# Patient Record
Sex: Male | Born: 1950 | Race: White | Hispanic: No | Marital: Married | Smoking: Never smoker
Health system: Southern US, Community
[De-identification: ages and names within clinical notes are randomized; demographics above are authoritative.]

## PROBLEM LIST (undated history)

## (undated) DIAGNOSIS — I10 Essential (primary) hypertension: Secondary | ICD-10-CM

## (undated) DIAGNOSIS — F329 Major depressive disorder, single episode, unspecified: Secondary | ICD-10-CM

## (undated) DIAGNOSIS — M62838 Other muscle spasm: Secondary | ICD-10-CM

## (undated) DIAGNOSIS — E785 Hyperlipidemia, unspecified: Secondary | ICD-10-CM

## (undated) DIAGNOSIS — K219 Gastro-esophageal reflux disease without esophagitis: Secondary | ICD-10-CM

## (undated) DIAGNOSIS — T8859XA Other complications of anesthesia, initial encounter: Secondary | ICD-10-CM

## (undated) DIAGNOSIS — D649 Anemia, unspecified: Secondary | ICD-10-CM

## (undated) DIAGNOSIS — M1990 Unspecified osteoarthritis, unspecified site: Secondary | ICD-10-CM

## (undated) DIAGNOSIS — I714 Abdominal aortic aneurysm, without rupture, unspecified: Secondary | ICD-10-CM

## (undated) DIAGNOSIS — F419 Anxiety disorder, unspecified: Secondary | ICD-10-CM

## (undated) DIAGNOSIS — G4733 Obstructive sleep apnea (adult) (pediatric): Secondary | ICD-10-CM

## (undated) DIAGNOSIS — R531 Weakness: Secondary | ICD-10-CM

## (undated) DIAGNOSIS — R350 Frequency of micturition: Secondary | ICD-10-CM

## (undated) DIAGNOSIS — M4300 Spondylolysis, site unspecified: Secondary | ICD-10-CM

## (undated) DIAGNOSIS — F32A Depression, unspecified: Secondary | ICD-10-CM

## (undated) DIAGNOSIS — M792 Neuralgia and neuritis, unspecified: Secondary | ICD-10-CM

## (undated) HISTORY — DX: Essential (primary) hypertension: I10

## (undated) HISTORY — DX: Hyperlipidemia, unspecified: E78.5

## (undated) HISTORY — PX: ANKLE ARTHROPLASTY: SUR68

## (undated) HISTORY — DX: Obstructive sleep apnea (adult) (pediatric): G47.33

## (undated) HISTORY — PX: APPENDECTOMY: SHX54

## (undated) HISTORY — DX: Major depressive disorder, single episode, unspecified: F32.9

## (undated) HISTORY — PX: OTHER SURGICAL HISTORY: SHX169

## (undated) HISTORY — PX: TONSILLECTOMY: SUR1361

## (undated) HISTORY — DX: Depression, unspecified: F32.A

## (undated) HISTORY — PX: COLONOSCOPY: SHX174

## (undated) HISTORY — DX: Anxiety disorder, unspecified: F41.9

---

## 2001-02-06 HISTORY — PX: REFRACTIVE SURGERY: SHX103

## 2003-10-08 HISTORY — PX: OTHER SURGICAL HISTORY: SHX169

## 2003-10-15 ENCOUNTER — Ambulatory Visit (HOSPITAL_COMMUNITY): Admission: RE | Admit: 2003-10-15 | Discharge: 2003-10-15 | Payer: Self-pay | Admitting: General Surgery

## 2006-08-06 ENCOUNTER — Encounter: Admission: RE | Admit: 2006-08-06 | Discharge: 2006-08-06 | Payer: Self-pay | Admitting: Internal Medicine

## 2007-05-29 ENCOUNTER — Ambulatory Visit: Payer: Self-pay | Admitting: Internal Medicine

## 2007-06-10 ENCOUNTER — Ambulatory Visit: Payer: Self-pay | Admitting: Internal Medicine

## 2010-06-24 NOTE — Op Note (Signed)
Joshua [name], Joshua [name]                          ACCOUNT NO.:  [id number]   MEDICAL RECORD NO.:  [id number]                   PATIENT TYPE:  AMB   LOCATION:  DAY                                  FACILITY:  [facility]   PHYSICIAN:  Adolph Pollack, M.D.            DATE OF BIRTH:  Feb 06, 1951   DATE OF PROCEDURE:  10/15/2003  DATE OF DISCHARGE:                                 OPERATIVE REPORT   PREOPERATIVE DIAGNOSIS:  Bilateral inguinal herniae.   POSTOPERATIVE DIAGNOSIS:  Bilateral indirect inguinal herniae.   PROCEDURE:  Laparoscopic repair of bilateral inguinal herniae with mesh.   SURGEON:  Adolph Pollack, M.D.   ANESTHESIA:  General.   INDICATIONS FOR PROCEDURE:  [name] is a 60 year old male I had seen in the  past with bilateral inguinal herniae, left greater than right.  He came back  to the office here recently requesting repair and we discussed the  laparoscopic technique and he presents for that now.  The procedure and  risks were discussed with him preoperatively.   SURGICAL TECHNIQUE:  He was seen in the holding area and brought to the  operating room and placed supine on the operating table.  A general  anesthetic was administered.  The hair on the lower abdominal wall was  clipped and a Foley catheter was placed in the bladder sterilely.  The lower  abdominal wall and groin were sterilely prepped and draped.  Dilute Marcaine  solution was then infiltrated in the subumbilical region and a subumbilical  incision was made through the skin and subcutaneous tissue.  Using blunt  dissection, I identified the left anterior rectus sheath and made a small  incision in it.  The underlying rectus muscle was swept laterally exposing  the posterior rectus sheath.  A balloon dissection device was then placed in  the extraperitoneal space under laparoscopic visualization.  Balloon  dissection was performed.  Once this was done, I removed the balloon  dissection trocar and  inserted a trocar into the extraperitoneal space  insufflating the CO2 gas creating a working area.  The laparoscope was then  introduced and under direct vision, two 5 mm trocars were then placed  through small incisions in the lower midline.  I approached the left side  first and identified Coopers ligament.  I dissected fibrofatty tissue away  from the anterior and lateral abdominal walls to the level of the umbilicus.  I then isolated the spermatic cord and noticed an indirect hernia sac going  up to a somewhat patulous internal ring.  I was able to dissect the sac free  from the cord and strip it back to the level of the umbilicus.  The direct  space appeared to be solid.   Next, I approached the right side and identified Coopers ligament and  dissected fibrofatty tissue from it.  The direct space was identified and  was solid.  Using blunt  dissection, I dissected fibrofatty tissue away from  the anterior and lateral abdominal wall up to the level of the umbilicus.  The spermatic cord was identified, isolated, and another indirect sac was  noted on the right side and this was dissected free from the cord and  stripped back to the level of the umbilicus.  Following this, a piece of 5  by 6 inch mesh with a partial longitudinal slit cut into it was then placed  into the left extraperitoneal space and positioned adequately with the two  tails of the mesh wrapped around the spermatic cord.  The inferomedial  portion of the mesh was anchored to Coopers ligament with spiral tacks.  The  anterior and lateral aspects of the mesh were then anchored to the abdominal  wall with spiral tacks.  This appeared to provide more than adequate  coverage of the direct, indirect, and femoral spaces.  Following this,  another piece of 5 by 6 inch mesh with a partial longitudinal slit cut into  it was then placed into the right extraperitoneal space.  The two tails of  the mesh were wrapped around the  spermatic cord.  The mesh was then  positioned appropriately and was anchored to Coopers ligament, the anterior  and lateral abdominal walls with the spiral tacking device.  This provided  for more than adequate coverage of direct, indirect, and femoral spaces.   I then inspected the area and hemostasis was adequate.  I then used  instruments to hold down the inferolateral aspects of the mesh and released  the CO2 gas.  I removed the instruments and the trocars.  The left anterior  rectus sheath defect was then closed with interrupted 0 Vicryl sutures.  The  skin incisions were closed with 4-0 Monocryl subcuticular stitches followed  by Steri-Strips and sterile dressings.  He tolerated the procedure well  without any apparent complication and was taken to the recovery room in  satisfactory condition.  I discussed this with his wife.  He will be given  postop instructions and Tylox for pain.  He will be seen back in the office  in 2-3 weeks.                                               Adolph Pollack, M.D.    Kari Baars  D:  10/15/2003  T:  10/15/2003  Job:  [id number]   cc:   Vania Rea. Jarold Motto, M.D. [facility]

## 2011-10-10 ENCOUNTER — Ambulatory Visit
Admission: RE | Admit: 2011-10-10 | Discharge: 2011-10-10 | Disposition: A | Discharge status: Home / Self Care | Payer: 59 | Source: Ambulatory Visit | Attending: Internal Medicine | Admitting: Internal Medicine

## 2011-10-10 ENCOUNTER — Other Ambulatory Visit: Payer: Self-pay | Admitting: Internal Medicine

## 2011-10-10 DIAGNOSIS — M545 Low back pain, unspecified: Secondary | ICD-10-CM

## 2012-04-04 ENCOUNTER — Other Ambulatory Visit (HOSPITAL_COMMUNITY): Payer: Self-pay | Admitting: Internal Medicine

## 2012-04-04 DIAGNOSIS — M545 Low back pain, unspecified: Secondary | ICD-10-CM

## 2012-04-04 DIAGNOSIS — R200 Anesthesia of skin: Secondary | ICD-10-CM

## 2012-04-05 ENCOUNTER — Ambulatory Visit (HOSPITAL_COMMUNITY)
Admission: RE | Admit: 2012-04-05 | Discharge: 2012-04-05 | Disposition: A | Discharge status: Home / Self Care | Payer: 59 | Source: Ambulatory Visit | Attending: Internal Medicine | Admitting: Internal Medicine

## 2012-04-05 DIAGNOSIS — M5124 Other intervertebral disc displacement, thoracic region: Secondary | ICD-10-CM | POA: Insufficient documentation

## 2012-04-05 DIAGNOSIS — IMO0002 Reserved for concepts with insufficient information to code with codable children: Secondary | ICD-10-CM | POA: Insufficient documentation

## 2012-04-05 DIAGNOSIS — M5144 Schmorl's nodes, thoracic region: Secondary | ICD-10-CM | POA: Insufficient documentation

## 2012-04-05 DIAGNOSIS — R209 Unspecified disturbances of skin sensation: Secondary | ICD-10-CM | POA: Insufficient documentation

## 2012-04-05 DIAGNOSIS — I77819 Aortic ectasia, unspecified site: Secondary | ICD-10-CM | POA: Insufficient documentation

## 2012-04-05 DIAGNOSIS — M545 Low back pain, unspecified: Secondary | ICD-10-CM | POA: Insufficient documentation

## 2012-04-06 ENCOUNTER — Ambulatory Visit (HOSPITAL_COMMUNITY)
Admission: RE | Admit: 2012-04-06 | Discharge: 2012-04-06 | Disposition: A | Discharge status: Home / Self Care | Payer: 59 | Source: Ambulatory Visit | Attending: Internal Medicine | Admitting: Internal Medicine

## 2012-04-06 DIAGNOSIS — R200 Anesthesia of skin: Secondary | ICD-10-CM

## 2012-04-06 DIAGNOSIS — M545 Low back pain: Secondary | ICD-10-CM

## 2013-04-18 ENCOUNTER — Other Ambulatory Visit: Payer: Self-pay | Admitting: Internal Medicine

## 2013-04-21 ENCOUNTER — Other Ambulatory Visit: Payer: Self-pay | Admitting: Internal Medicine

## 2013-04-21 DIAGNOSIS — I714 Abdominal aortic aneurysm, without rupture, unspecified: Secondary | ICD-10-CM

## 2013-04-25 ENCOUNTER — Ambulatory Visit
Admission: RE | Admit: 2013-04-25 | Discharge: 2013-04-25 | Disposition: A | Discharge status: Home / Self Care | Payer: 59 | Source: Ambulatory Visit | Attending: Internal Medicine | Admitting: Internal Medicine

## 2013-04-25 DIAGNOSIS — I714 Abdominal aortic aneurysm, without rupture, unspecified: Secondary | ICD-10-CM

## 2013-09-30 ENCOUNTER — Encounter: Payer: Self-pay | Admitting: Neurology

## 2013-10-01 ENCOUNTER — Encounter: Payer: 59 | Admitting: Neurology

## 2013-10-01 NOTE — Progress Notes (Signed)
NO SHOW

## 2013-10-02 NOTE — Progress Notes (Signed)
This patient was one of th few not happy with [facility], and is looking for an alternative DME. CD

## 2013-10-03 ENCOUNTER — Ambulatory Visit (INDEPENDENT_AMBULATORY_CARE_PROVIDER_SITE_OTHER): Payer: 59 | Admitting: Neurology

## 2013-10-03 ENCOUNTER — Encounter: Payer: Self-pay | Admitting: Neurology

## 2013-10-03 VITALS — BP 134/85 | HR 75 | Resp 16 | Ht 65.25 in | Wt 154.0 lb

## 2013-10-03 DIAGNOSIS — M6250 Muscle wasting and atrophy, not elsewhere classified, unspecified site: Secondary | ICD-10-CM

## 2013-10-03 DIAGNOSIS — M6258 Muscle wasting and atrophy, not elsewhere classified, other site: Secondary | ICD-10-CM

## 2013-10-03 DIAGNOSIS — G4710 Hypersomnia, unspecified: Secondary | ICD-10-CM

## 2013-10-03 DIAGNOSIS — G4730 Sleep apnea, unspecified: Principal | ICD-10-CM

## 2013-10-03 NOTE — Patient Instructions (Signed)
Reduce Wellbutrin to one a day and than discontinue 14 days prior to MSLT.  Ritalin: do not use the day before a or the day of the test. Hypersomnia Hypersomnia usually brings recurrent episodes of excessive daytime sleepiness or prolonged nighttime sleep. It is different than feeling tired due to lack of or interrupted sleep at night. People with hypersomnia are compelled to nap repeatedly during the day. This is often at inappropriate times such as:  At work.  During a meal.  In conversation. These daytime naps usually provide no relief. This disorder typically affects adolescents and young adults. CAUSES  This condition may be caused by:  Another sleep disorder (such as narcolepsy or sleep apnea).  Dysfunction of the autonomic nervous system.  Drug or alcohol abuse.  A physical problem, such as:  A tumor.  Head trauma. This is damage caused by an accident.  Injury to the central nervous system.  Certain medications, or medicine withdrawal.  Medical conditions may contribute to the disorder, including:  Multiple sclerosis.  Depression.  Encephalitis.  Epilepsy.  Obesity.  Some people appear to have a genetic predisposition to this disorder. In others, there is no known cause. SYMPTOMS   Patients often have difficulty waking from a long sleep. They may feel dazed or confused.  Other symptoms may include:  Anxiety.  Increased irritation (inflammation).  Decreased energy.  Restlessness.  Slow thinking.  Slow speech.  Loss of appetite.  Hallucinations.  Memory difficulty.  Tremors, Tics.  Some patients lose the ability to function in family, social, occupational, or other settings. TREATMENT  Treatment is symptomatic in nature. Stimulants and other drugs may be used to treat this disorder. Changes in behavior may help. For example, avoid night work and social activities that delay bed time. Changes in diet may offer some relief. Patients should  avoid alcohol and caffeine. PROGNOSIS  The likely outcome (prognosis) for persons with hypersomnia depends on the cause of the disorder. The disorder itself is not life threatening. But it can have serious consequences. For example, automobile accidents can be caused by falling asleep while driving. The attacks usually continue indefinitely. Document Released: 01/13/2002 Document Revised: 04/17/2011 Document Reviewed: 12/18/2007 [facility] Patient Information 2015 [place], [place]. This information is not intended to replace advice given to you by your health care provider. Make sure you discuss any questions you have with your health care provider.

## 2013-10-03 NOTE — Addendum Note (Signed)
Addended by: [name] on: 10/03/2013 11:08 AM   Modules accepted: Orders

## 2013-10-03 NOTE — Progress Notes (Signed)
SLEEP MEDICINE CLINIC   Provider:  [name], M D  Referring Provider: No ref. provider found Primary Care Physician:  [name], MD  Chief Complaint  Patient presents with  . New Evaluation    Room 10  . Sleep consult    HPI:  Joshua [name] is a 63 y.o. male , who is seen here as a referral from Dr. [name] for hypersomnia.   [name] is a loud snorer and his wife asked him to sleep in another room. His snoring may have been louder, he knows he snored for decades. His wife , a Software engineer, was recently diagnosed with a cancer  and underwent a Tongue surgery ( 2015) , back at work now. Has speech and swallowing difficulties, which also concern him.  The patient endorsed today the geriatric depression scale it took points, the Epworth sleepiness scale at 17 points in the fatigue severity score at 21 points. [name]  is a slender individual, muscular but not overweight,  had undergone a sleep study in 2003 which returned without  evidence of sleep abnormalities at that time.  The patient goes to bed around 10.30 to 11.30 Pm , the bedroom is cool , quiet and dark,  goes to sleep promptly. Has a sound machine , sleeps alone.   He is currently not gainfully employed. He wakes up from nocturia 3 times at night, which he attributes to hypertension medication.  Generally, he likes to stay in bed- wakes at 6.30  , feels un-restored  and has a lot more anxiety , racing thoughts.  He has more frequent dreams, but never acted these out.  He has an irresistible urge to fall asleep. He may stay for a half hour in bed. This began with the job loss. He was in a high paced job, Engineer, water . His children have left the parental home.  He will get 6 hours of sleep at night, has recently started to take power naps, 30 -60 minutes. He regularly exercises and feels better with physical activity.   He has a low carb nutrition plan he sticks to. He has some milder back pain, he lives  with it. ( Dr. [name] ) , some shoulder pain, hindering him to sleep on his left.            Review of Systems: Out of a complete 14 system review, the patient complains of only the following symptoms, and all other reviewed systems are negative. Snoring, nocturia . Sleep attacks.   Epworth score 17 , Fatigue severity score 21  , depression score 2    History   Social History  . Marital Status: Married    Spouse Name: Joshua [name]    Number of Children: 2  . Years of Education: College   Occupational History  .     Social History Main Topics  . Smoking status: Never Smoker   . Smokeless tobacco: Never Used  . Alcohol Use: Yes     Comment: socially  . Drug Use: No  . Sexual Activity: Not on file   Other Topics Concern  . Not on file   Social History Narrative   Patient is married Joshua [name])   Patient has two children.   Patient drinks two caffeine drinks per day.   Patient is right-handed.   Patient has a college education.    Family History  Problem Relation Age of Onset  . Lung disease Father   . Parkinson's disease Father   .  Lymphoma Mother     Past Medical History  Diagnosis Date  . Depression   . Herpes simplex     Right buttocks  . Hyperlipidemia   . Hypersomnolence   . Low back pain     Right  . Anxiety   . Hypertension     Past Surgical History  Procedure Laterality Date  . Lap inguinal hernia repair wiht mesh Bilateral 10/2003  . Refractive surgery  2003    Current Outpatient Prescriptions  Medication Sig Dispense Refill  . aspirin 325 MG EC tablet Take 325 mg by mouth daily.      Marland Kitchen buPROPion (WELLBUTRIN SR) 100 MG 12 hr tablet Take 100 mg by mouth 2 (two) times daily.      . famciclovir (FAMVIR) 125 MG tablet Take 125 mg by mouth 2 (two) times daily as needed.      . gabapentin (NEURONTIN) 300 MG capsule Take 300 mg by mouth 2 (two) times daily. To reduce pain      . losartan-hydrochlorothiazide (HYZAAR) 50-12.5 MG per tablet Take  1 tablet by mouth daily.      . methylphenidate (RITALIN SR) 20 MG ER tablet Take 20 mg by mouth daily.      . metoprolol succinate (TOPROL-XL) 25 MG 24 hr tablet Take 25 mg by mouth daily.      . Multiple Vitamin (MULTIVITAMINS PO) Take 1 tablet by mouth daily.      . simvastatin (ZOCOR) 40 MG tablet Take 40 mg by mouth daily.       No current facility-administered medications for this visit.    Allergies as of 10/03/2013  . (Not on File)    Vitals: BP 134/85  Pulse 75  Resp 16  Ht 5' 5.25" (1.657 m)  Wt 154 lb (69.854 kg)  BMI 25.44 kg/m2 Last Weight:  Wt Readings from Last 1 Encounters:  10/03/13 154 lb (69.854 kg)       Last Height:   Ht Readings from Last 1 Encounters:  10/03/13 5' 5.25" (1.657 m)    Physical exam:  General: The patient is awake, alert and appears not in acute distress. The patient is well groomed. Head: Normocephalic, atraumatic. Neck is supple. Mallampati 2  neck circumference: 16. Nasal airflow unrestricted, TMJ is not  evident . Retrognathia is not seen.  Cardiovascular:  Regular rate and rhythm , without  murmurs or carotid bruit, and without distended neck veins. Respiratory: Lungs are clear to auscultation. Skin:  Without evidence of edema, or rash Trunk: BMI is not  elevated and patient  has normal posture.  Neurologic exam : The patient is awake and alert, oriented to place and time.   Memory subjective  described as intact. There is a normal attention span & concentration ability. Speech is fluent without   dysarthria, dysphonia or aphasia. Mood and affect are appropriate.  Cranial nerves: Pupils are equal and briskly reactive to light. Funduscopic exam without  evidence of pallor or edema.  Extraocular movements  in vertical and horizontal planes intact and without nystagmus. Visual fields by finger perimetry are intact. Hearing to finger rub intact.  Facial sensation intact to fine touch. Facial motor strength is symmetric and tongue and  uvula move midline.  Motor exam:  Normal tone ,muscle bulk and symmetric ,strength in all extremities.  Sensory:  Fine touch, pinprick and vibration were tested in all extremities. Proprioception is  normal.  Coordination: Rapid alternating movements in the fingers/hands is normal. Finger-to-nose maneuver  normal  without evidence of ataxia, dysmetria or tremor.  Gait and station: Patient walks without assistive device and is able unassisted to climb up to the exam table.  Strength within normal limits. Stance is stable and normal.   Deep tendon reflexes: in the  upper and lower extremities are symmetric and intact. Babinski maneuver response is downgoing.   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 No physical indicators of high risk for sleep apnea.  2) Epworth 17, without cataplexy, but narcoleptic levels of sleepiness.   Nuvigil ordered, PSG with MSLT to follow.    The patient was advised of the nature of the diagnosed sleep disorder , the treatment options and risks for general a health and wellness arising from not treating the condition. Visit duration was 45 minutes.   Plan:  Treatment plan and additional workup :     [name] MD  10/03/2013

## 2013-10-04 LAB — COMPREHENSIVE METABOLIC PANEL
A/G RATIO: 1.7 (ref 1.1–2.5)
ALK PHOS: 78 IU/L (ref 39–117)
ALT: 25 IU/L (ref 0–44)
AST: 27 IU/L (ref 0–40)
Albumin: 4.1 g/dL (ref 3.6–4.8)
BUN/Creatinine Ratio: 20 (ref 10–22)
BUN: 23 mg/dL (ref 8–27)
CALCIUM: 9.6 mg/dL (ref 8.6–10.2)
CHLORIDE: 99 mmol/L (ref 97–108)
CO2: 26 mmol/L (ref 18–29)
Creatinine, Ser: 1.13 mg/dL (ref 0.76–1.27)
GFR calc Af Amer: 81 mL/min/{1.73_m2} (ref >59)
GFR calc non Af Amer: 70 mL/min/{1.73_m2} (ref >59)
GLOBULIN, TOTAL: 2.4 g/dL (ref 1.5–4.5)
Glucose: 74 mg/dL (ref 65–99)
Potassium: 4.5 mmol/L (ref 3.5–5.2)
Sodium: 142 mmol/L (ref 134–144)
Total Bilirubin: 0.8 mg/dL (ref 0.0–1.2)
Total Protein: 6.5 g/dL (ref 6.0–8.5)

## 2013-10-04 LAB — T3, FREE: T3 FREE: 2.8 pg/mL (ref 2.0–4.4)

## 2013-10-07 ENCOUNTER — Telehealth: Payer: Self-pay | Admitting: Neurology

## 2013-10-07 NOTE — Telephone Encounter (Signed)
Per Dr. Brett Fairy, left a voice message for the patient that his labs were normal including his thyroid.  Patient was advised to call the office with any questions or concerns.

## 2013-10-07 NOTE — Telephone Encounter (Signed)
Patient requesting Blood Work results.  Please return call anytime and may leave detailed message on voice mail.

## 2013-10-08 ENCOUNTER — Telehealth: Payer: Self-pay | Admitting: Neurology

## 2013-10-08 NOTE — Telephone Encounter (Signed)
All labs in normal range, Vit D and testosterone to be addressed by PCP, we will not get coverage, TSH was normal.  I left VM for patient , CD

## 2013-11-04 ENCOUNTER — Telehealth: Payer: Self-pay | Admitting: Neurology

## 2013-11-04 NOTE — Telephone Encounter (Signed)
Patient calling to get more instructions about what he needs to do regarding going off of his medication before his sleep study, please return call and advise.

## 2013-11-05 NOTE — Telephone Encounter (Signed)
Can you touch bases with patient concerning his medication before sleep test are advise me on what he needs to do and I can give him a call.

## 2013-11-13 ENCOUNTER — Ambulatory Visit (INDEPENDENT_AMBULATORY_CARE_PROVIDER_SITE_OTHER): Payer: 59

## 2013-11-13 DIAGNOSIS — G4730 Sleep apnea, unspecified: Principal | ICD-10-CM

## 2013-11-13 DIAGNOSIS — G4733 Obstructive sleep apnea (adult) (pediatric): Secondary | ICD-10-CM

## 2013-11-13 DIAGNOSIS — R0683 Snoring: Secondary | ICD-10-CM

## 2013-11-13 DIAGNOSIS — G4710 Hypersomnia, unspecified: Secondary | ICD-10-CM

## 2013-11-27 ENCOUNTER — Telehealth: Payer: Self-pay | Admitting: *Deleted

## 2013-11-27 NOTE — Telephone Encounter (Signed)
Patient called requesting we email him his test results.  I emailed him and stated that there was a positive diagnosis for OSA and a subsequent CPAP study had been ordered.  Patient was informed to contact our office to schedule that.  A copy of the test results were mailed to him and a copy was faxed to Dr. Leanna Battles.

## 2013-12-01 ENCOUNTER — Encounter: Payer: Self-pay | Admitting: Neurology

## 2013-12-01 ENCOUNTER — Ambulatory Visit (INDEPENDENT_AMBULATORY_CARE_PROVIDER_SITE_OTHER): Payer: 59 | Admitting: Neurology

## 2013-12-01 VITALS — BP 136/83 | HR 55 | Temp 97.6°F | Resp 12 | Ht 66.5 in | Wt 156.0 lb

## 2013-12-01 DIAGNOSIS — G4710 Hypersomnia, unspecified: Secondary | ICD-10-CM

## 2013-12-01 DIAGNOSIS — G4733 Obstructive sleep apnea (adult) (pediatric): Secondary | ICD-10-CM

## 2013-12-01 DIAGNOSIS — G4730 Sleep apnea, unspecified: Secondary | ICD-10-CM

## 2013-12-01 HISTORY — DX: Obstructive sleep apnea (adult) (pediatric): G47.33

## 2013-12-01 NOTE — Progress Notes (Signed)
SLEEP [facility]INE [facility]IC   Provider:  [name], M D  Referring Provider: Leanna Battles, MD Primary Care Physician:  [name], MD  Chief Complaint  Patient presents with  . RV sleep    Rm 10, Alone    HPI:  Joshua [name] is a 63 y.o. male , who is seen here as a referral from Dr. Philip [name] for hypersomnia.   Joshua [name] is a loud snorer and his wife asked him to sleep in another room. His snoring may have been louder, he knows he snored for decades. His wife , a Software engineer, was recently diagnosed with a cancer  and underwent a Tongue surgery ( 2015) , back at work now. Has speech and swallowing difficulties, which also concern him.  The patient endorsed today the geriatric depression scale it took points, the Epworth sleepiness scale at 17 points in the fatigue severity score at 21 points. Joshua [name]  is a slender individual, muscular but not overweight,  had undergone a sleep study in 2003 which returned without  evidence of sleep abnormalities at that time.  The patient goes to bed around 10.30 to 11.30 Pm , the bedroom is cool , quiet and dark,  goes to sleep promptly. Has a sound machine , sleeps alone.   He is currently not gainfully employed. He wakes up from nocturia 3 times at night, which he attributes to hypertension medication.  Generally, he likes to stay in bed- wakes at 6.30  , feels un-restored  and has a lot more anxiety , racing thoughts.  He has more frequent dreams, but never acted these out.  He has an irresistible urge to fall asleep. He may stay for a half hour in bed. This began with the job loss. He was in a high paced job, Engineer, water . His children have left the parental home.  He will get 6 hours of sleep at night, has recently started to take power naps, 30 -60 minutes. He regularly exercises and feels better with physical activity.   He has a low carb nutrition plan he sticks to. He has some milder back pain, he lives with it. ( Dr. [name]  ) , some shoulder pain, hindering him to sleep on his left.   Interval history 12-01-13,  [name] underwent a polysomnography study on 11-13-13 which documented a mild to moderate sleep apnea of the overall AHI was 16.6 but the RDI was 33.3. I would consider this a apnea with upper airway resistance syndrome. He did not have significant oxygen desaturations and not significant sleep interruptions from periodic limb movements. There is a strong positional component to his apnea. We discussed today that he could be a candidate for a dental device that would move his lower jaw forward and therefore opening space and the back these devices can help with bruxism and they can in some circumstances help with TMJ positive airway pressure is 1 option as well in addition to a dental device we need to avoid supine sleep and I discussed the tennis ball method with the patient.   I explained that the dental device has a success rate of about 65% and can be made to measure for him based on a medical diagnosis. This would allow him to use his usual medical insurance instead of a special dental insurance to have it manufactured. I gave him a hand out about the dental devices- as he very apprehensive about CPAP use. The tennisball was less scary to him,  he is aware that  may return for a HST to check the success of the dental device.       Review of Systems: Out of a complete 14 system review, the patient complains of only the following symptoms, and all other reviewed systems are negative. Snoring, nocturia . Sleep attacks.  Epworth 12,  FSS  23 points , avoiding sleeping on the side.   Last Epworth score was 17 , Fatigue severity score 21  , depression score 2  Points.    History   Social History  . Marital Status: Married    Spouse Name: [name]    Number of Children: 2  . Years of Education: College   Occupational History  .     Social History Main Topics  . Smoking status: Never Smoker   .  Smokeless tobacco: Never Used  . Alcohol Use: Yes     Comment: socially  . Drug Use: No  . Sexual Activity: Not on file   Other Topics Concern  . Not on file   Social History Narrative   Patient is married [name])   Patient has two children.   Patient drinks two caffeine drinks per day.   Patient is right-handed.   Patient has a college education.    Family History  Problem Relation Age of Onset  . Lung disease Father   . Parkinson's disease Father   . Lymphoma Mother     Past Medical History  Diagnosis Date  . Depression   . Herpes simplex     Right buttocks  . Hyperlipidemia   . Hypersomnolence   . Low back pain     Right  . Anxiety   . Hypertension   . Hypersomnia, persistent 10/03/2013    Past Surgical History  Procedure Laterality Date  . Lap inguinal hernia repair wiht mesh Bilateral 10/2003  . Refractive surgery  2003    Current Outpatient Prescriptions  Medication Sig Dispense Refill  . aspirin 325 MG EC tablet Take 325 mg by mouth daily.      Marland Kitchen buPROPion (WELLBUTRIN SR) 100 MG 12 hr tablet Take 100 mg by mouth 2 (two) times daily.      . famciclovir (FAMVIR) 125 MG tablet Take 125 mg by mouth 2 (two) times daily as needed.      . gabapentin (NEURONTIN) 300 MG capsule Take 300 mg by mouth 2 (two) times daily. To reduce pain      . losartan-hydrochlorothiazide (HYZAAR) 50-12.5 MG per tablet Take 1 tablet by mouth daily.      . methylphenidate (RITALIN SR) 20 MG ER tablet Take 20 mg by mouth daily.      . metoprolol succinate (TOPROL-XL) 25 MG 24 hr tablet Take 25 mg by mouth daily.      . Multiple Vitamin (MULTIVITAMINS PO) Take 1 tablet by mouth daily.      . simvastatin (ZOCOR) 40 MG tablet Take 40 mg by mouth daily.       No current facility-administered medications for this visit.    Allergies as of 12/01/2013  . (No Known Allergies)    Vitals: BP 136/83  Pulse 55  Temp(Src) 97.6 F (36.4 C) (Oral)  Resp 12  Ht 5' 6.5" (1.689 m)  Wt  156 lb (70.761 kg)  BMI 24.80 kg/m2 Last Weight:  Wt Readings from Last 1 Encounters:  12/01/13 156 lb (70.761 kg)       Last Height:   Ht Readings from Last 1 Encounters:  12/01/13 5' 6.5" (1.689 m)    Physical exam:  General: The patient is awake, alert and appears not in acute distress.  The patient is well groomed. Head: Normocephalic, atraumatic. Neck is supple. Mallampati 2  neck circumference: 16. Nasal airflow unrestricted,  TMJ is not evident . Retrognathia is not seen.  Respiratory: Lungs are clear to auscultation. Skin:  Without evidence of edema, or rash Trunk: BMI is not  elevated and patient  has normal posture.  Neurologic exam : The patient is awake and alert, oriented to place and time.   Memory subjective  described as intact. There is a normal attention span & concentration ability.  Speech is fluent without   dysarthria, dysphonia or aphasia. Mood and affect are appropriate.  Cranial nerves: Pupils are equal and briskly reactive to light. Hearing to finger rub intact.  Facial sensation intact to fine touch.  Facial motor strength is symmetric and tongue and uvula move midline.  Motor exam:  Normal tone ,muscle bulk and symmetric ,strength in all extremities.  Coordination: Rapid alternating movements in the fingers/hands is normal. Finger-to-nose maneuver  normal without evidence of ataxia, dysmetria or tremor.  Gait and station: Patient walks without assistive device and is able unassisted to climb up to the exam table.  Strength within normal limits. Stance is stable and normal.   Deep tendon reflexes: in the  upper and lower extremities are symmetric and intact. Babinski maneuver response is downgoing.   Assessment:  After physical and neurologic examination, review of laboratory studies, imaging, neurophysiology testing and pre-existing records, assessment is  1) hypersomnia with sleep apnea. OSA was 16.6 AHI in sleep study, 30 in supine sleep.  2)  Epworth 12 without cataplexy.  The patient was advised of the nature of the diagnosed sleep disorder , the treatment options and risks for general a health and wellness arising from not treating the condition. Visit duration was 25 minutes.  i Plan:  Treatment plan and additional workup :   discussed dental referral for device , positional Apnea, OSA;  AHI 16.6 , supine 30 .  I offered a referral Dr. Augustina Mood. DDS .  He will first try 30 days of auto-titration, if not successfully, send to dentist.    [name] MD  12/01/2013

## 2013-12-01 NOTE — Patient Instructions (Signed)
CPAP trial on autotitration.

## 2014-10-01 ENCOUNTER — Other Ambulatory Visit: Payer: Self-pay | Admitting: Internal Medicine

## 2014-10-01 DIAGNOSIS — I714 Abdominal aortic aneurysm, without rupture, unspecified: Secondary | ICD-10-CM

## 2014-10-06 ENCOUNTER — Other Ambulatory Visit: Payer: 59

## 2014-11-06 ENCOUNTER — Encounter: Payer: Self-pay | Admitting: Internal Medicine

## 2015-02-11 ENCOUNTER — Ambulatory Visit
Admission: RE | Admit: 2015-02-11 | Discharge: 2015-02-11 | Disposition: A | Discharge status: Home / Self Care | Payer: 59 | Source: Ambulatory Visit | Attending: Internal Medicine | Admitting: Internal Medicine

## 2015-02-11 ENCOUNTER — Other Ambulatory Visit: Payer: 59

## 2015-02-11 DIAGNOSIS — I714 Abdominal aortic aneurysm, without rupture, unspecified: Secondary | ICD-10-CM

## 2015-02-23 DIAGNOSIS — M5416 Radiculopathy, lumbar region: Secondary | ICD-10-CM | POA: Diagnosis not present

## 2015-02-23 DIAGNOSIS — M4316 Spondylolisthesis, lumbar region: Secondary | ICD-10-CM | POA: Diagnosis not present

## 2015-02-23 DIAGNOSIS — M47816 Spondylosis without myelopathy or radiculopathy, lumbar region: Secondary | ICD-10-CM | POA: Diagnosis not present

## 2015-02-23 DIAGNOSIS — M4306 Spondylolysis, lumbar region: Secondary | ICD-10-CM | POA: Diagnosis not present

## 2015-04-02 ENCOUNTER — Other Ambulatory Visit (HOSPITAL_COMMUNITY): Payer: Self-pay | Admitting: Orthopedic Surgery

## 2015-04-02 DIAGNOSIS — M25571 Pain in right ankle and joints of right foot: Secondary | ICD-10-CM

## 2015-04-14 ENCOUNTER — Ambulatory Visit (HOSPITAL_COMMUNITY): Admission: RE | Admit: 2015-04-14 | Payer: 59 | Source: Ambulatory Visit

## 2015-04-14 ENCOUNTER — Ambulatory Visit (HOSPITAL_COMMUNITY)
Admission: RE | Admit: 2015-04-14 | Discharge: 2015-04-14 | Disposition: A | Discharge status: Home / Self Care | Payer: 59 | Source: Ambulatory Visit | Attending: Orthopedic Surgery | Admitting: Orthopedic Surgery

## 2015-04-14 DIAGNOSIS — M948X7 Other specified disorders of cartilage, ankle and foot: Secondary | ICD-10-CM | POA: Diagnosis not present

## 2015-04-14 DIAGNOSIS — M659 Synovitis and tenosynovitis, unspecified: Secondary | ICD-10-CM | POA: Diagnosis not present

## 2015-04-14 DIAGNOSIS — M25471 Effusion, right ankle: Secondary | ICD-10-CM | POA: Insufficient documentation

## 2015-04-14 DIAGNOSIS — M25571 Pain in right ankle and joints of right foot: Secondary | ICD-10-CM | POA: Diagnosis not present

## 2015-04-27 DIAGNOSIS — M19071 Primary osteoarthritis, right ankle and foot: Secondary | ICD-10-CM | POA: Diagnosis not present

## 2015-05-05 DIAGNOSIS — M19171 Post-traumatic osteoarthritis, right ankle and foot: Secondary | ICD-10-CM | POA: Diagnosis not present

## 2015-05-05 DIAGNOSIS — M25571 Pain in right ankle and joints of right foot: Secondary | ICD-10-CM | POA: Diagnosis not present

## 2015-08-13 DIAGNOSIS — M549 Dorsalgia, unspecified: Secondary | ICD-10-CM | POA: Diagnosis not present

## 2015-08-13 DIAGNOSIS — M546 Pain in thoracic spine: Secondary | ICD-10-CM | POA: Diagnosis not present

## 2015-08-13 DIAGNOSIS — Q762 Congenital spondylolisthesis: Secondary | ICD-10-CM | POA: Diagnosis not present

## 2015-08-18 DIAGNOSIS — M4806 Spinal stenosis, lumbar region: Secondary | ICD-10-CM | POA: Diagnosis not present

## 2015-08-18 DIAGNOSIS — M5126 Other intervertebral disc displacement, lumbar region: Secondary | ICD-10-CM | POA: Diagnosis not present

## 2015-08-19 DIAGNOSIS — M5136 Other intervertebral disc degeneration, lumbar region: Secondary | ICD-10-CM | POA: Diagnosis not present

## 2015-08-19 DIAGNOSIS — M4726 Other spondylosis with radiculopathy, lumbar region: Secondary | ICD-10-CM | POA: Diagnosis not present

## 2015-08-19 DIAGNOSIS — Q762 Congenital spondylolisthesis: Secondary | ICD-10-CM | POA: Diagnosis not present

## 2015-08-19 DIAGNOSIS — M47816 Spondylosis without myelopathy or radiculopathy, lumbar region: Secondary | ICD-10-CM | POA: Diagnosis not present

## 2015-08-19 DIAGNOSIS — M5416 Radiculopathy, lumbar region: Secondary | ICD-10-CM | POA: Diagnosis not present

## 2015-08-19 DIAGNOSIS — M4806 Spinal stenosis, lumbar region: Secondary | ICD-10-CM | POA: Diagnosis not present

## 2015-09-28 DIAGNOSIS — M4306 Spondylolysis, lumbar region: Secondary | ICD-10-CM | POA: Diagnosis not present

## 2015-09-28 DIAGNOSIS — M5136 Other intervertebral disc degeneration, lumbar region: Secondary | ICD-10-CM | POA: Diagnosis not present

## 2015-09-28 DIAGNOSIS — Q762 Congenital spondylolisthesis: Secondary | ICD-10-CM | POA: Diagnosis not present

## 2015-09-28 DIAGNOSIS — M47816 Spondylosis without myelopathy or radiculopathy, lumbar region: Secondary | ICD-10-CM | POA: Diagnosis not present

## 2015-09-28 DIAGNOSIS — M4806 Spinal stenosis, lumbar region: Secondary | ICD-10-CM | POA: Diagnosis not present

## 2015-09-30 DIAGNOSIS — Z125 Encounter for screening for malignant neoplasm of prostate: Secondary | ICD-10-CM | POA: Diagnosis not present

## 2015-09-30 DIAGNOSIS — Z0000 Encounter for general adult medical examination without abnormal findings: Secondary | ICD-10-CM | POA: Diagnosis not present

## 2015-10-05 DIAGNOSIS — M25571 Pain in right ankle and joints of right foot: Secondary | ICD-10-CM | POA: Diagnosis not present

## 2015-10-05 DIAGNOSIS — I10 Essential (primary) hypertension: Secondary | ICD-10-CM | POA: Diagnosis not present

## 2015-10-05 DIAGNOSIS — R634 Abnormal weight loss: Secondary | ICD-10-CM | POA: Diagnosis not present

## 2015-10-05 DIAGNOSIS — R5383 Other fatigue: Secondary | ICD-10-CM | POA: Diagnosis not present

## 2015-10-05 DIAGNOSIS — Z0000 Encounter for general adult medical examination without abnormal findings: Secondary | ICD-10-CM | POA: Diagnosis not present

## 2015-10-05 DIAGNOSIS — F3289 Other specified depressive episodes: Secondary | ICD-10-CM | POA: Diagnosis not present

## 2015-10-05 DIAGNOSIS — F900 Attention-deficit hyperactivity disorder, predominantly inattentive type: Secondary | ICD-10-CM | POA: Diagnosis not present

## 2015-10-05 DIAGNOSIS — E784 Other hyperlipidemia: Secondary | ICD-10-CM | POA: Diagnosis not present

## 2015-10-05 DIAGNOSIS — M545 Low back pain: Secondary | ICD-10-CM | POA: Diagnosis not present

## 2015-10-08 DIAGNOSIS — Z1212 Encounter for screening for malignant neoplasm of rectum: Secondary | ICD-10-CM | POA: Diagnosis not present

## 2015-10-28 DIAGNOSIS — E298 Other testicular dysfunction: Secondary | ICD-10-CM | POA: Diagnosis not present

## 2016-01-27 DIAGNOSIS — Z23 Encounter for immunization: Secondary | ICD-10-CM | POA: Diagnosis not present

## 2016-01-27 DIAGNOSIS — E298 Other testicular dysfunction: Secondary | ICD-10-CM | POA: Diagnosis not present

## 2016-02-15 DIAGNOSIS — E298 Other testicular dysfunction: Secondary | ICD-10-CM | POA: Diagnosis not present

## 2016-02-15 DIAGNOSIS — Z0000 Encounter for general adult medical examination without abnormal findings: Secondary | ICD-10-CM | POA: Diagnosis not present

## 2016-03-15 DIAGNOSIS — E298 Other testicular dysfunction: Secondary | ICD-10-CM | POA: Diagnosis not present

## 2016-04-05 DIAGNOSIS — E298 Other testicular dysfunction: Secondary | ICD-10-CM | POA: Diagnosis not present

## 2016-04-13 ENCOUNTER — Ambulatory Visit (INDEPENDENT_AMBULATORY_CARE_PROVIDER_SITE_OTHER): Payer: Self-pay

## 2016-04-13 ENCOUNTER — Ambulatory Visit (INDEPENDENT_AMBULATORY_CARE_PROVIDER_SITE_OTHER): Payer: 59 | Admitting: Surgery

## 2016-04-13 ENCOUNTER — Encounter (INDEPENDENT_AMBULATORY_CARE_PROVIDER_SITE_OTHER): Payer: Self-pay | Admitting: Surgery

## 2016-04-13 VITALS — BP 137/79 | HR 53 | Ht 67.0 in | Wt 150.0 lb

## 2016-04-13 DIAGNOSIS — M25511 Pain in right shoulder: Secondary | ICD-10-CM | POA: Diagnosis not present

## 2016-04-13 DIAGNOSIS — M4722 Other spondylosis with radiculopathy, cervical region: Secondary | ICD-10-CM | POA: Diagnosis not present

## 2016-04-13 DIAGNOSIS — M542 Cervicalgia: Secondary | ICD-10-CM | POA: Diagnosis not present

## 2016-04-13 DIAGNOSIS — M7541 Impingement syndrome of right shoulder: Secondary | ICD-10-CM | POA: Diagnosis not present

## 2016-04-13 MED ORDER — TRAMADOL HCL 50 MG PO TABS: 50.0000 mg | ORAL_TABLET | Freq: Four times a day (QID) | ORAL | 0 refills | Status: DC | PRN

## 2016-04-13 MED ORDER — METHYLPREDNISOLONE 4 MG PO TABS: ORAL_TABLET | ORAL | 0 refills | Status: DC

## 2016-04-13 MED ORDER — METHOCARBAMOL 500 MG PO TABS: 500.0000 mg | ORAL_TABLET | Freq: Three times a day (TID) | ORAL | 0 refills | Status: DC | PRN

## 2016-04-13 NOTE — Progress Notes (Signed)
Office Visit Note   Patient: Joshua [name]           Date of Birth: 01-24-1951           MRN: [id number] Visit Date: 04/13/2016              Requested by: Leanna Battles, MD [address], [place] [address] PCP: [name], MD   Assessment & Plan: Visit Diagnoses:  1. Neck pain   2. Acute pain of right shoulder   3. Other spondylosis with radiculopathy, cervical region   4. Shoulder impingement, right     Plan: With patient's increased pain and failed conservative treatment at this point along with complaint of weakness by history and on exam I we'll schedule MRI scans of the cervical spine to rule out HNP/stenosis and also the right shoulder to rule out rotator cuff tear. Patient will follow up in the office with Dr. Lorin Mercy after completion of both studies to discuss results and further treatment options. Prescriptions given for Medrol Dosepak 6 day taper to be taken as directed, Robaxin and tramadol. Patient will discontinue use of Norco and naproxen. I also advised patient to discontinue the neck strengthening exercises that he just started a week and a half ago. X-rays and treatment plan were discussed with patient and his daughter who was present in great detail. All questions answered.    Follow-Up Instructions: Return for yates after MRI scans neck and shoulder.   Orders:  Orders Placed This Encounter  Procedures  . XR Cervical Spine 2 or 3 views  . XR Shoulder Right  . MR Cervical Spine w/o contrast  . MR SHOULDER RIGHT WO CONTRAST   Meds ordered this encounter  Medications  . methylPREDNISolone (MEDROL) 4 MG tablet    Sig: 6 day taper to be taken as directed    Dispense:  21 tablet    Refill:  0  . traMADol (ULTRAM) 50 MG tablet    Sig: Take 1 tablet (50 mg total) by mouth every 6 (six) hours as needed.    Dispense:  30 tablet    Refill:  0  . methocarbamol (ROBAXIN) 500 MG tablet    Sig: Take 1 tablet (500 mg total) by mouth every 8 (eight)  hours as needed for muscle spasms.    Dispense:  50 tablet    Refill:  0      Procedures: No procedures performed   Clinical Data: No additional findings.   Subjective: Chief Complaint  Patient presents with  . Neck - Pain  . Right Shoulder - Pain    Patient presents today with acute right sided neck and shoulder pain. The pain runs down into the right arm. He states that he does feel some tingling near the proximal humerus. He does not have a definite known injury, but does lift weights and has been working out. He is taking gabapentin and has tried hydrocodone from a previous prescription that really provided minimal relief. He states that it does knock the edge off, but the pain is unmanageable. He describes spasms that come and go.  Patient states that he's had off and on feeling of right shoulder weakness for a while but cannot say exactly for how long. Also chronic intermittent numbness and tingling into the right upper arm down into his elbow and forearm. Over the last week or so she's been having complaints of right-sided neck pain that radiates into the right shoulder and down to his forearm.  No symptoms on the left side. Patient is very active with working out at [employer] and has recently been doing some neck strengthening exercises which involves a strap around his head using resistance has been doing Flexion and extension and lateral bending strengthening. He thinks this has aggravated his current complaint. He states that at times holding his right arm over his head relieve some of his neck pain and radicular symptoms. Also holding his arm across his chest give some improvement as well. Although he does state that overhead reaching can also aggravate pain.   sleeping on his right side also causes shoulder pain.  He has had to avoid some overhead weight training due to right shoulder weakness patient has seen neurosurgeon Dr. [name] in the past and reports that he has had a  cervical spine MRI. We do not have that study for our review states that patient was told that he did have cervical degenerative disc disease but the problem was not that severe for treatment. He has not had any improvement in his symptoms with the exercise routine that he does. Has also taken Norco and naproxen without any improvement.    Review of Systems  Respiratory: Negative.   Genitourinary: Negative.   Musculoskeletal: Positive for myalgias, neck pain and neck stiffness.  Neurological: Positive for weakness (Right shoulder) and numbness.  Psychiatric/Behavioral: Negative.      Objective: Vital Signs: BP 137/79   Pulse (!) 53   Ht 5\' 7"  (1.702 m)   Wt 150 lb (68 kg)   BMI 23.49 kg/m   Physical Exam  Constitutional: He is oriented to person, place, and time. He appears well-developed. No distress.  HENT:  Head: Normocephalic and atraumatic.  Nose: Nose normal.  Eyes: EOM are normal. Pupils are equal, round, and reactive to light.  Neck:  He does have some limitation and C-spine range of motion with flexion extension and lateral bending due to stiffness. Moderate to marked right brachial plexus trapezius tenderness. Negative on the left side. Positive right Spurling test. He does have some relief of his right upper extremity pain numbness and tingling with cervical distraction.  Pulmonary/Chest: No stridor. No respiratory distress.  Abdominal: He exhibits no distension.  Musculoskeletal: Normal range of motion.  Bilateral shoulders patient has good range of motion. Right shoulder positive impingement test. Negative drop arm. He does have obvious supraspinatus weakness with resistance. Trace right subscap, infraspinatus/teres  weakness with resistance. Trace right biceps and triceps weakness. Left shoulder negative impingement test. Good cuff strength. On the left it looks like he may have a chronic long head biceps tendon rupture. Bilateral elbows good range of motion. Negative  Tinel's over the bilateral cubital tunnels. Bilateral wrist good range of motion. Negative Tinel's  Lymphadenopathy:    He has no cervical adenopathy.  Neurological: He is alert and oriented to person, place, and time.  Skin: Skin is warm and dry.  Psychiatric: He has a normal mood and affect.    Ortho Exam  Specialty Comments:  No specialty comments available.  Imaging: Xr Cervical Spine 2 Or 3 Views  Result Date: 04/13/2016 X-ray cervical spine so straightening of the normal cervical lordosis. Multilevel cervical spondylosis C3-T1 with disc space collapse at all levels and spurring. No acute findings. Incidental finding of an external occipital protuberance.  Xr Shoulder Right  Result Date: 04/13/2016 X-ray right shoulder that show some degenerative changes of the acromioclavicular joint. Bony prominence of the greater tuberosity proximal humerus he does have some  irregularity of the cortical bone with an area of scalloping. X-rays were reviewed with Dr. Alphonzo Severance today. No acute findings.    PMFS History: Patient Active Problem List   Diagnosis Date Noted  . OSA (obstructive sleep apnea) 12/01/2013  . ERRONEOUS ENCOUNTER--DISREGARD 10/27/2013  . Hypersomnia, persistent 10/03/2013   Past Medical History:  Diagnosis Date  . Anxiety   . Depression   . Herpes simplex    Right buttocks  . Hyperlipidemia   . Hypersomnia, persistent 10/03/2013  . Hypersomnolence   . Hypertension   . Low back pain    Right  . OSA (obstructive sleep apnea)   . OSA (obstructive sleep apnea) 12/01/2013   16.6 AHI and RDI of over 30. REM AHI over 30.      Family History  Problem Relation Age of Onset  . Lung disease Father   . Parkinson's disease Father   . Lymphoma Mother     Past Surgical History:  Procedure Laterality Date  . lap inguinal hernia repair wiht mesh Bilateral 10/2003  . REFRACTIVE SURGERY  2003   Social History   Occupational History  .  [employer]   Social  History Main Topics  . Smoking status: Never Smoker  . Smokeless tobacco: Never Used  . Alcohol use Yes     Comment: socially  . Drug use: No  . Sexual activity: Not on file

## 2016-04-18 ENCOUNTER — Ambulatory Visit (INDEPENDENT_AMBULATORY_CARE_PROVIDER_SITE_OTHER): Payer: 59 | Admitting: Physical Medicine and Rehabilitation

## 2016-04-23 ENCOUNTER — Other Ambulatory Visit: Payer: 59

## 2016-04-25 ENCOUNTER — Telehealth (INDEPENDENT_AMBULATORY_CARE_PROVIDER_SITE_OTHER): Payer: Self-pay | Admitting: Orthopaedic Surgery

## 2016-04-25 MED ORDER — TRAMADOL HCL 50 MG PO TABS: 50.0000 mg | ORAL_TABLET | Freq: Four times a day (QID) | ORAL | 0 refills | Status: DC | PRN

## 2016-04-25 MED ORDER — METHOCARBAMOL 500 MG PO TABS: 500.0000 mg | ORAL_TABLET | Freq: Three times a day (TID) | ORAL | 0 refills | Status: DC | PRN

## 2016-04-25 NOTE — Addendum Note (Signed)
Addended by: Meyer Cory on: 04/25/2016 02:36 PM   Modules accepted: Orders

## 2016-04-25 NOTE — Telephone Encounter (Signed)
Please advise 

## 2016-04-25 NOTE — Telephone Encounter (Signed)
I called tramadol to pharmacy and sent robaxin in. I attempted to call patient and advise about prednisone and ROV, but his mailbox is full and I cannot leave a message. Will try again.

## 2016-04-25 NOTE — Telephone Encounter (Signed)
ucall. OK for refill tramadol and methocarbamol.  Best to NOT  refill  methylprednisilone due to risk of hip AVN, infection etc. ucall . Make sure he has ROV after the 3/23  MRI scan for review thanks

## 2016-04-25 NOTE — Addendum Note (Signed)
Addended by: Meyer Cory on: 04/25/2016 03:32 PM   Modules accepted: Orders

## 2016-04-25 NOTE — Telephone Encounter (Signed)
Patient called needing Rx's refilled ( Methylpredmisolome, Methocarbamol and Tramadol. Patient asked to call the Rx into the [facility].  Ph# [phone]    The number to contact patient is [phone]

## 2016-04-26 DIAGNOSIS — E298 Other testicular dysfunction: Secondary | ICD-10-CM | POA: Diagnosis not present

## 2016-04-26 NOTE — Telephone Encounter (Signed)
I spoke with patient and advised. He has follow up appt scheduled for 3/27.

## 2016-04-27 ENCOUNTER — Telehealth (INDEPENDENT_AMBULATORY_CARE_PROVIDER_SITE_OTHER): Payer: Self-pay | Admitting: Orthopaedic Surgery

## 2016-04-27 NOTE — Telephone Encounter (Signed)
Pt requested a call back at [phone] to discuss an MRI.

## 2016-04-27 NOTE — Telephone Encounter (Signed)
Patient called back in the [place] office. He wanted to know if he had to have a 3.0 TESLA magnet or if he could do the 1.5 because the 1.5 is $500 cheaper. I advised that he needed to do the stronger-he is having a cervical spine MRI.

## 2016-04-27 NOTE — Telephone Encounter (Signed)
I attempted to return patient's call, went straight to voicemail. I will try and reach him later.

## 2016-04-28 ENCOUNTER — Other Ambulatory Visit (INDEPENDENT_AMBULATORY_CARE_PROVIDER_SITE_OTHER): Payer: Self-pay | Admitting: Surgery

## 2016-04-28 ENCOUNTER — Ambulatory Visit
Admission: RE | Admit: 2016-04-28 | Discharge: 2016-04-28 | Disposition: A | Discharge status: Home / Self Care | Payer: 59 | Source: Ambulatory Visit | Attending: Surgery | Admitting: Surgery

## 2016-04-28 ENCOUNTER — Other Ambulatory Visit (INDEPENDENT_AMBULATORY_CARE_PROVIDER_SITE_OTHER): Payer: Self-pay | Admitting: Radiology

## 2016-04-28 ENCOUNTER — Telehealth (INDEPENDENT_AMBULATORY_CARE_PROVIDER_SITE_OTHER): Payer: Self-pay | Admitting: Orthopaedic Surgery

## 2016-04-28 DIAGNOSIS — M25511 Pain in right shoulder: Secondary | ICD-10-CM

## 2016-04-28 DIAGNOSIS — M542 Cervicalgia: Secondary | ICD-10-CM

## 2016-04-28 DIAGNOSIS — M4802 Spinal stenosis, cervical region: Secondary | ICD-10-CM | POA: Diagnosis not present

## 2016-04-28 NOTE — Telephone Encounter (Signed)
Dr. Lorin Mercy and I have both spoken with patient. Due to finding on MRI, he is to have CT scan. This is scheduled for Monday, May 01, 2016 at 4:10p. Patient needs to arrive by 3:50p at [facility]. Patient aware of time of appt.

## 2016-04-28 NOTE — Telephone Encounter (Signed)
Pt asked if you could return his call regarding his MRI asap.

## 2016-04-30 ENCOUNTER — Ambulatory Visit
Admission: RE | Admit: 2016-04-30 | Discharge: 2016-04-30 | Disposition: A | Discharge status: Home / Self Care | Payer: 59 | Source: Ambulatory Visit | Attending: Surgery | Admitting: Surgery

## 2016-04-30 DIAGNOSIS — M75101 Unspecified rotator cuff tear or rupture of right shoulder, not specified as traumatic: Secondary | ICD-10-CM | POA: Diagnosis not present

## 2016-04-30 DIAGNOSIS — M25511 Pain in right shoulder: Secondary | ICD-10-CM

## 2016-05-01 ENCOUNTER — Ambulatory Visit
Admission: RE | Admit: 2016-05-01 | Discharge: 2016-05-01 | Disposition: A | Discharge status: Home / Self Care | Payer: 59 | Source: Ambulatory Visit | Attending: Orthopaedic Surgery | Admitting: Orthopaedic Surgery

## 2016-05-01 DIAGNOSIS — M542 Cervicalgia: Secondary | ICD-10-CM

## 2016-05-01 DIAGNOSIS — M50221 Other cervical disc displacement at C4-C5 level: Secondary | ICD-10-CM | POA: Diagnosis not present

## 2016-05-01 DIAGNOSIS — M50222 Other cervical disc displacement at C5-C6 level: Secondary | ICD-10-CM | POA: Diagnosis not present

## 2016-05-02 ENCOUNTER — Ambulatory Visit (INDEPENDENT_AMBULATORY_CARE_PROVIDER_SITE_OTHER): Payer: 59 | Admitting: Orthopaedic Surgery

## 2016-05-02 ENCOUNTER — Encounter (INDEPENDENT_AMBULATORY_CARE_PROVIDER_SITE_OTHER): Payer: Self-pay | Admitting: Orthopaedic Surgery

## 2016-05-02 VITALS — Ht 67.0 in | Wt 150.0 lb

## 2016-05-02 DIAGNOSIS — M4802 Spinal stenosis, cervical region: Secondary | ICD-10-CM

## 2016-05-02 DIAGNOSIS — M19011 Primary osteoarthritis, right shoulder: Secondary | ICD-10-CM | POA: Insufficient documentation

## 2016-05-02 NOTE — Progress Notes (Signed)
Office Visit Note   Patient: Joshua [name]           Date of Birth: Oct 14, 1958           MRN: [id number] Visit Date: 05/02/2016              Requested by: Leanna Battles, MD [address] [place],  [address] PCP: [name], MD   Assessment & Plan: Visit Diagnoses:  1. Spinal stenosis of cervical region   2. Primary osteoarthritis, right shoulder     Plan: Patient has 2 separate problems. Has significant spondylosis in the cervical spine multilevel changes in the most severe levels are C4-5 and C5-6. His retrolisthesis of C4-5 with resultant mild central stenosis. Combination of ligamentum hypertrophy as well as disc bulge causing severe right greater than left C5 foraminal stenosis. At the C5-6 level he has severe disc space loss disc osteophyte complex mild central stenosis and severe right greater than left foraminal stenosis. He also has some stenosis moderate to severe degree at other levels and mild narrowing at T1-2 and T2-3. Changes on MRI were follow-up with a CT scan which did not reveal any facet fractures and this all appears to be degenerative in nature. We discussed in detail options for treatment and is option would be C4-5 C5-6 anterior cervical discectomy and fusion. I would recommend leaving the other levels he can think about this and return in a month to discuss this further. He's got some improvement with the Medrol Dosepak.  Patient's other problem is his right shoulder which shows degenerative labral tears some tendinopathy in the long head of the biceps and also significant glenohumeral arthritis with loss of cartilage and multiple loose bodies present in the joint. He has some catching but still is been doing all types of weight lifting. We discussed options for shoulder and we went over activities he should avoid such as military presses and work more on machines versus free weights cut back on weight limits and mostly try to maintain tone and not  increased weight. We can discuss his shoulder on return. Currently her shoulder and his Nexium be bothering him about the same and is noticed some decreased strength in his right arm but no myelopathic changes.  Follow-Up Instructions: Return in about 1 month (around 06/02/2016).   Orders:  No orders of the defined types were placed in this encounter.  No orders of the defined types were placed in this encounter.     Procedures: No procedures performed   Clinical Data: No additional findings.   Subjective: Chief Complaint  Patient presents with  . Neck - Pain  . Right Shoulder - Pain    Patient returns to review MRI Right Shoulder, MRI Cervical Spine, and CT Cervical Spine. He states that the spasms have calmed down a little. He continues to have pain in his right bicep. He feels that the Medrol Dosepak has helped some. He is taking the tramadol and robaxin at night and is unable to tell a lot of relief from those.   Patient acute onset of neck pain was when he is trying to move and lift weights with his neck and had an increase in pain. Patient has still been doing pull-ups, and pushups ,bench press, and military press, curls, triceps etc.  We went over the CT scan of his neck, MRI of his neck MRI of his shoulder. We discussed pathophysiology of the conditions that he has recommendations for CAD activities were discussed in  detail and modifications in his workout were recommended and he will follow him. Recheck in one month.  Review of Systems review of systems unchanged from his last visit on 04/13/2016 and are updated other than above scans that were reviewed.   Objective: Vital Signs: Ht 5\' 7"  (1.702 m)   Wt 150 lb (68 kg)   BMI 23.49 kg/m   Physical Exam  Constitutional: He is oriented to person, place, and time. He appears well-developed and well-nourished.  HENT:  Head: Normocephalic and atraumatic.  Eyes: EOM are normal. Pupils are equal, round, and reactive to  light.  Neck: No tracheal deviation present. No thyromegaly present.  Cardiovascular: Normal rate.   Pulmonary/Chest: Effort normal. He has no wheezes.  Abdominal: Soft. Bowel sounds are normal.  Musculoskeletal:  Positive Spurling on the right. Some mild pain with the empty can test on the right negative on the left. ( partial supraspinatus tear on MRI) no lower extremity hyperreflexia normal gait.  Neurological: He is alert and oriented to person, place, and time.  Skin: Skin is warm and dry. Capillary refill takes less than 2 seconds.  Psychiatric: He has a normal mood and affect. His behavior is normal. Judgment and thought content normal.    Ortho Exam no lower extremity hyperreflexia. [place] nature.  Specialty Comments:  No specialty comments available.  Imaging: Ct Cervical Spine Wo Contrast  Result Date: 05/01/2016 CLINICAL DATA:  Cervicalgia. Bone marrow edema on the right at C4, C5, and C6. Possible fracture. Cervical spondylosis. EXAM: CT CERVICAL SPINE WITHOUT CONTRAST TECHNIQUE: Multidetector CT imaging of the cervical spine was performed without intravenous contrast. Multiplanar CT image reconstructions were also generated. COMPARISON:  Cervical MRI 04/28/2016 FINDINGS: Alignment: Mild anterolisthesis C2-3. Mild retrolisthesis C3-4 and C4-5. Mild anterolisthesis C7-T1. Skull base and vertebrae: Negative for fracture or mass lesion. MRI finding of bone marrow edema is felt to be degenerative. Soft tissues and spinal canal: Negative for soft tissue mass. Carotid artery calcification bilaterally. Disc levels:  C2-3:  Mild disc degeneration C3-4: Advanced disc degeneration with diffuse uncinate spurring. Severe foraminal encroachment bilaterally due to spurring C4-5: Severe disc degeneration with diffuse uncinate spurring causing severe foraminal encroachment bilaterally and moderate spinal stenosis C5-6: Severe disc degeneration and diffuse uncinate spurring. Moderate to severe  foraminal encroachment bilaterally. Central canal stenosis. C6-7: Disc degeneration and spondylosis. Diffuse uncinate spurring with moderate foraminal encroachment bilaterally and mild spinal stenosis C7-T1: Severe facet degeneration on the right. Severe right foraminal encroachment. Left foramen patent. Disc degeneration and spurring at T1-2 and T2-3 causing foraminal encroachment bilaterally. Upper chest: Lung apices clear. Other: None IMPRESSION: Negative for fracture or mass. Bone marrow edema identified by MRI is felt to be related to advanced disc degeneration. Severe disc degeneration and spondylosis throughout the cervical and upper thoracic spine. Multilevel foraminal encroachment and spinal stenosis as above. Of note, there is very little facet degeneration. Electronically Signed   By: Franchot Gallo M.D.   On: 05/01/2016 17:01   Addended by Gaspar Cola, MD on 04/28/2016 3:14 PM    Study Result   CLINICAL DATA:  66 year old male with cervical neck and right shoulder injury while working out at the gym. Progressive pain and weakness for 2 weeks including in the right arm and right shoulder. Deltoid weakness.  EXAM: MRI CERVICAL SPINE WITHOUT CONTRAST  TECHNIQUE: Multiplanar, multisequence MR imaging of the cervical spine was performed. No intravenous contrast was administered.  COMPARISON:  Cervical spine radiographs 04/13/2016  FINDINGS: Alignment: Stable vertebral height  and alignment from the recent radiographs, including straightening of lower cervical lordosis with mild retrolisthesis of C4 on C5 and mild reversal of upper cervical lordosis. There is mild anterolisthesis of C7 on T1.  Vertebrae: Confluent abnormal marrow edema in the right C5 facet, the right C6 superior articulating facet, and also the right aspect of the C4, C5, and C6 vertebral bodies. See series 5, images 1-5. The changes are most pronounced about the right C5-C6 facet and right C5-C6 disc space.  Superimposed chronic degenerative endplate marrow changes at most cervical levels.  Cord: Despite multilevel cervical spinal stenosis there is no spinal cord signal abnormality identified.  Posterior Fossa, vertebral arteries, paraspinal tissues: Abnormal signal in the C4-C5 interspinous ligament (series 5, image 6). Other anterior and posterior ligamentous complex signal is within normal limits.  Preserved major vascular flow voids in the neck. Cervicomedullary junction is within normal limits.  Disc levels:  C2-C3: Mild facet and uncovertebral hypertrophy. Mild to moderate left C3 foraminal stenosis.  C3-C4: Moderate to severe disc space loss with circumferential disc osteophyte complex. Broad-based posterior component and right greater than left uncovertebral hypertrophy. No significant spinal stenosis. Severe bilateral C4 foraminal stenosis.  C4-C5: Mild retrolisthesis. Moderate to severe disc space loss with circumferential disc osteophyte complex eccentric to the left. Broad-based posterior component with ligament flavum hypertrophy and spinal stenosis with mild spinal cord mass effect (series 7, image 12). Uncovertebral hypertrophy. Severe right greater than left C5 foraminal stenosis.  C5-C6: Moderate to severe disc space loss with right eccentric circumferential disc osteophyte complex. Broad-based posterior component. Moderate ligament flavum hypertrophy. Spinal stenosis with mild spinal cord mass effect. Severe right greater than left C6 foraminal stenosis.  C6-C7: Disc space loss with right eccentric circumferential disc osteophyte complex and broad-based posterior component. No significant spinal stenosis. Foraminal disc and uncovertebral hypertrophy. Moderate to severe bilateral C7 foraminal stenosis.  C7-T1: Mild anterolisthesis. Moderate to severe disc space loss. Right eccentric circumferential disc osteophyte complex. Moderate right facet  hypertrophy. Bilateral uncovertebral hypertrophy. Mild left and moderate to severe right C8 foraminal stenosis.  Upper thoracic levels are also remarkable for disc space loss, circumferential disc osteophyte complex, ligament flavum hypertrophy, and facet hypertrophy. There is mild spinal stenosis at T1-T2 and T2-T3 with bilateral neural foraminal stenosis which appears severe at the right T2 nerve level.  IMPRESSION: 1. Marrow edema in the right C5-C6 facet as well as the C4, C5, and C6 vertebral bodies. This could be posttraumatic or degenerative, and in the setting of recent traumatic injury recommend follow-up noncontrast Cervical Spine CT to exclude underlying fracture. 2. Widespread advanced chronic cervical disc and endplate degeneration with cervical spinal stenosis C4-C5 and C5-C6 with spinal cord mass effect but no spinal cord signal abnormality identified. 3. Widespread multifactorial severe cervical foraminal stenosis including at the bilateral C4, right greater than left C5 and C6, bilateral C7 and right C8 nerve levels.  Electronically Signed: By: Genevie Ann M.D. On: 04/28/2016 14:44      CONTRAST  Study Result   CLINICAL DATA:  Progressive neck and right shoulder pain and weakness after working out at the gym. No acute injury or prior relevant surgery.  EXAM: MRI OF THE RIGHT SHOULDER WITHOUT CONTRAST  TECHNIQUE: Multiplanar, multisequence MR imaging of the shoulder was performed. No intravenous contrast was administered.  COMPARISON:  Radiographs 04/13/2016.  Cervical MRI 04/28/2016.  FINDINGS: Despite efforts by the technologist and patient, moderate motion artifact is present on today's exam and could not be eliminated.  This reduces exam sensitivity and specificity.  Rotator cuff: Rotator cuff evaluation limited by motion. There is tendinosis of the subscapularis, supraspinatus and infraspinatus tendons. There is at least high-grade partial  bursal surface tearing of the distal supraspinatus tendon, best seen on coronal images 5 and 6 of series 9. It is difficult to exclude a small full-thickness tear, although there is no tendon retraction. The teres minor tendon appears normal.  Muscles:  No focal muscular atrophy or edema.  Biceps long head: Intact and normally positioned. There is moderate tendinosis of the intra-articular portion.  Acromioclavicular Joint: The acromion is type 2. Mild-to-moderate acromioclavicular degenerative changes with mild synovial thickening. A small amount of fluid is present in the subacromial-subdeltoid bursa. There is also fluid anteriorly in the subcoracoid bursa. There is a possible loose body in that bursa, best seen on sagittal image 5 of series 4.  Glenohumeral Joint: Moderate glenohumeral degenerative changes with multiple subchondral cysts posteriorly and anteriorly in the glenoid. Small shoulder joint effusion. There is a loose body in the superior subscapularis recess, best seen on sagittal image 3 of series 4.The subacromial space is narrowed.  Labrum: Labral evaluation is limited by the motion. The labrum is diffusely degenerated without discrete tear. As above, there are subchondral cysts within the glenoid. No definite paralabral cyst.  Bones: No acute or significant extra-articular osseous findings.  Other: No significant soft tissue findings.  IMPRESSION: 1. Study is moderately motion degraded. This limits assessment of the rotator cuff, labrum and biceps tendon. 2. Diffuse rotator cuff tendinosis with focal, at least high-grade partial bursal surface tearing of the supraspinatus tendon. 3. Moderate bicipital tendinosis. 4. Prominent glenohumeral degenerative changes with subchondral cysts in the glenoid and intra-articular loose bodies. The labrum is degenerated without obvious tear. 5. Mild-to-moderate acromioclavicular degenerative  changes.   Electronically Signed   By: Richardean Sale M.D.   On: 04/30/2016 14:52       PMFS History: Patient Active Problem List   Diagnosis Date Noted  . Spinal stenosis of cervical region 05/02/2016  . Primary osteoarthritis, right shoulder 05/02/2016  . OSA (obstructive sleep apnea) 12/01/2013  . ERRONEOUS ENCOUNTER--DISREGARD 10/27/2013  . Hypersomnia, persistent 10/03/2013   Past Medical History:  Diagnosis Date  . Anxiety   . Depression   . Herpes simplex    Right buttocks  . Hyperlipidemia   . Hypersomnia, persistent 10/03/2013  . Hypersomnolence   . Hypertension   . Low back pain    Right  . OSA (obstructive sleep apnea)   . OSA (obstructive sleep apnea) 12/01/2013   16.6 AHI and RDI of over 30. REM AHI over 30.      Family History  Problem Relation Age of Onset  . Lung disease Father   . Parkinson's disease Father   . Lymphoma Mother     Past Surgical History:  Procedure Laterality Date  . lap inguinal hernia repair wiht mesh Bilateral 10/2003  . REFRACTIVE SURGERY  2003   Social History   Occupational History  .  [employer]   Social History Main Topics  . Smoking status: Never Smoker  . Smokeless tobacco: Never Used  . Alcohol use Yes     Comment: socially  . Drug use: No  . Sexual activity: Not on file

## 2016-05-11 ENCOUNTER — Encounter (INDEPENDENT_AMBULATORY_CARE_PROVIDER_SITE_OTHER): Payer: Self-pay | Admitting: Orthopaedic Surgery

## 2016-05-12 ENCOUNTER — Telehealth (INDEPENDENT_AMBULATORY_CARE_PROVIDER_SITE_OTHER): Payer: Self-pay | Admitting: *Deleted

## 2016-05-12 ENCOUNTER — Telehealth (INDEPENDENT_AMBULATORY_CARE_PROVIDER_SITE_OTHER): Payer: Self-pay | Admitting: Radiology

## 2016-05-12 NOTE — Telephone Encounter (Signed)
Patient called in this afternoon in regards to wanting a copy of his x-rays please. He wanted to know if he could possibly pick these up today if possible? His CB # [phone]. Thank you

## 2016-05-12 NOTE — Telephone Encounter (Signed)
Patient is aware CD of imaging is ready for pickup and of the $5 fee.

## 2016-05-12 NOTE — Telephone Encounter (Signed)
Called to inform patient CD of images was ready for pick-up. Patient wanted a call back from [place] about finding a copy of his 05/02/16 assessment plan from Dr. Lorin Mercy on his MyChart, so he could have an e-copy for second opinion.  Call back # [phone]

## 2016-05-15 DIAGNOSIS — M5020 Other cervical disc displacement, unspecified cervical region: Secondary | ICD-10-CM | POA: Diagnosis not present

## 2016-05-15 DIAGNOSIS — M542 Cervicalgia: Secondary | ICD-10-CM | POA: Diagnosis not present

## 2016-05-15 DIAGNOSIS — M5412 Radiculopathy, cervical region: Secondary | ICD-10-CM | POA: Diagnosis not present

## 2016-05-15 DIAGNOSIS — R29898 Other symptoms and signs involving the musculoskeletal system: Secondary | ICD-10-CM | POA: Diagnosis not present

## 2016-05-15 DIAGNOSIS — M5030 Other cervical disc degeneration, unspecified cervical region: Secondary | ICD-10-CM | POA: Diagnosis not present

## 2016-05-15 DIAGNOSIS — M4722 Other spondylosis with radiculopathy, cervical region: Secondary | ICD-10-CM | POA: Diagnosis not present

## 2016-05-15 NOTE — Telephone Encounter (Signed)
I left voicemail for patient advising that I would be glad to email office note to him (ok per Abigail Butts). Asked for return call with email address.

## 2016-05-17 ENCOUNTER — Other Ambulatory Visit: Payer: Self-pay | Admitting: Internal Medicine

## 2016-05-17 DIAGNOSIS — I6523 Occlusion and stenosis of bilateral carotid arteries: Secondary | ICD-10-CM

## 2016-05-18 NOTE — Telephone Encounter (Signed)
I sent patient a message in My Chart asking what he would like for me to do in regards to his office note.

## 2016-05-19 ENCOUNTER — Other Ambulatory Visit: Payer: Self-pay | Admitting: Neurosurgery

## 2016-05-22 NOTE — Telephone Encounter (Signed)
Communicating with patient through My Chart at this point. I am sending him the number and link for My Chart support.

## 2016-05-25 ENCOUNTER — Ambulatory Visit
Admission: RE | Admit: 2016-05-25 | Discharge: 2016-05-25 | Disposition: A | Discharge status: Home / Self Care | Payer: 59 | Source: Ambulatory Visit | Attending: Internal Medicine | Admitting: Internal Medicine

## 2016-05-25 DIAGNOSIS — I6523 Occlusion and stenosis of bilateral carotid arteries: Secondary | ICD-10-CM | POA: Diagnosis not present

## 2016-06-06 ENCOUNTER — Ambulatory Visit (INDEPENDENT_AMBULATORY_CARE_PROVIDER_SITE_OTHER): Payer: 59 | Admitting: Orthopaedic Surgery

## 2016-06-20 ENCOUNTER — Encounter (HOSPITAL_COMMUNITY): Payer: Self-pay

## 2016-06-20 ENCOUNTER — Inpatient Hospital Stay (HOSPITAL_COMMUNITY)
Admission: RE | Admit: 2016-06-20 | Discharge: 2016-06-20 | Disposition: A | Discharge status: Home / Self Care | Payer: 59 | Source: Ambulatory Visit

## 2016-06-20 ENCOUNTER — Other Ambulatory Visit (HOSPITAL_COMMUNITY): Payer: 59

## 2016-06-20 NOTE — Pre-Procedure Instructions (Signed)
Joshua [name]  06/20/2016      [place], [place] [address] [place] [address] Phone: [phone] Fax: [phone]    Your procedure is scheduled on May 23  Report to [place] at 530 A.M.  Call this number if you have problems the morning of surgery:  [phone]   Remember:  Do not eat food or drink liquids after midnight.  Take these medicines the morning of surgery with A SIP OF WATER Bupropion (Wellbutrin), Gabapentin (Neurontin), Methocarbamol (Robaxin) if needed, Metoprolol succinate (Toprol-XL), tramadol (ultram) if needed, Famvir if needed  Stop/take aspirin as directed by your Dr.  Stop taking BC's, Goody's, Herbal medications, Fish oil, Vitamins, Ibuprofen, Advil, Motrin, Aleve   Do not wear jewelry, make-up or nail polish.  Do not wear lotions, powders, or perfumes, or deoderant.  Do not shave 48 hours prior to surgery.  Men may shave face and neck.  Do not bring valuables to the hospital.  [facility] is not responsible for any belongings or valuables.  Contacts, dentures or bridgework may not be worn into surgery.  Leave your suitcase in the car.  After surgery it may be brought to your room.  For patients admitted to the hospital, discharge time will be determined by your treatment team.  Patients discharged the day of surgery will not be allowed to drive home.   Special instructions:   - Preparing for Surgery  Before surgery, you can play an important role.  Because skin is not sterile, your skin needs to be as free of germs as possible.  You can reduce the number of germs on you skin by washing with CHG (chlorahexidine gluconate) soap before surgery.  CHG is an antiseptic cleaner which kills germs and bonds with the skin to continue killing germs even after washing.  Please DO NOT use if you have an allergy to CHG or antibacterial soaps.  If your skin  becomes reddened/irritated stop using the CHG and inform your nurse when you arrive at Short Stay.  Do not shave (including legs and underarms) for at least 48 hours prior to the first CHG shower.  You may shave your face.  Please follow these instructions carefully:   1.  Shower with CHG Soap the night before surgery and the   morning of Surgery.  2.  If you choose to wash your hair, wash your hair first as usual with your  normal shampoo.  3.  After you shampoo, rinse your hair and body thoroughly to remove the Shampoo.  4.  Use CHG as you would any other liquid soap.  You can apply chg directly  to the skin and wash gently with scrungie or a clean washcloth.  5.  Apply the CHG Soap to your body ONLY FROM THE NECK DOWN.    Do not use on open wounds or open sores.  Avoid contact with your eyes,   ears, mouth and genitals (private parts).  Wash genitals (private parts)  with your normal soap.  6.  Wash thoroughly, paying special attention to the area where your surgery  will be performed.  7.  Thoroughly rinse your body with warm water from the neck down.  8.  DO NOT shower/wash with your normal soap after using and rinsing off the CHG Soap.  9.  Pat yourself dry with a clean towel.  10.  Wear clean pajamas.            11.  Place clean sheets on your bed the night of your first shower and do not sleep with pets.  Day of Surgery  Do not apply any lotions/deoderants the morning of surgery.  Please wear clean clothes to the hospital/surgery center.     Please read over the following fact sheets that you were given. Pain Booklet, Coughing and Deep Breathing, MRSA Information and Surgical Site Infection Prevention

## 2016-06-22 ENCOUNTER — Encounter (HOSPITAL_COMMUNITY): Payer: Self-pay

## 2016-06-22 ENCOUNTER — Encounter (HOSPITAL_COMMUNITY)
Admission: RE | Admit: 2016-06-22 | Discharge: 2016-06-22 | Disposition: A | Discharge status: Home / Self Care | Payer: 59 | Source: Ambulatory Visit | Attending: Neurosurgery | Admitting: Neurosurgery

## 2016-06-22 DIAGNOSIS — M47812 Spondylosis without myelopathy or radiculopathy, cervical region: Secondary | ICD-10-CM | POA: Diagnosis not present

## 2016-06-22 DIAGNOSIS — Z01812 Encounter for preprocedural laboratory examination: Secondary | ICD-10-CM | POA: Diagnosis not present

## 2016-06-22 DIAGNOSIS — R001 Bradycardia, unspecified: Secondary | ICD-10-CM | POA: Diagnosis not present

## 2016-06-22 DIAGNOSIS — I440 Atrioventricular block, first degree: Secondary | ICD-10-CM | POA: Insufficient documentation

## 2016-06-22 DIAGNOSIS — Z01818 Encounter for other preprocedural examination: Secondary | ICD-10-CM | POA: Insufficient documentation

## 2016-06-22 HISTORY — DX: Unspecified osteoarthritis, unspecified site: M19.90

## 2016-06-22 HISTORY — DX: Other muscle spasm: M62.838

## 2016-06-22 HISTORY — DX: Spondylolysis, site unspecified: M43.00

## 2016-06-22 HISTORY — DX: Frequency of micturition: R35.0

## 2016-06-22 HISTORY — DX: Weakness: R53.1

## 2016-06-22 HISTORY — DX: Anemia, unspecified: D64.9

## 2016-06-22 HISTORY — DX: Neuralgia and neuritis, unspecified: M79.2

## 2016-06-22 LAB — SURGICAL PCR SCREEN
MRSA, PCR: NEGATIVE
Staphylococcus aureus: POSITIVE — AB

## 2016-06-22 LAB — BASIC METABOLIC PANEL
ANION GAP: 8 (ref 5–15)
BUN: 24 mg/dL — ABNORMAL HIGH (ref 6–20)
CALCIUM: 9.1 mg/dL (ref 8.9–10.3)
CO2: 27 mmol/L (ref 22–32)
Chloride: 105 mmol/L (ref 101–111)
Creatinine, Ser: 1.14 mg/dL (ref 0.61–1.24)
GLUCOSE: 73 mg/dL (ref 65–99)
POTASSIUM: 4.2 mmol/L (ref 3.5–5.1)
Sodium: 140 mmol/L (ref 135–145)

## 2016-06-22 LAB — CBC
HEMATOCRIT: 46.9 % (ref 39.0–52.0)
Hemoglobin: 15.7 g/dL (ref 13.0–17.0)
MCH: 29.9 pg (ref 26.0–34.0)
MCHC: 33.5 g/dL (ref 30.0–36.0)
MCV: 89.3 fL (ref 78.0–100.0)
Platelets: 253 10*3/uL (ref 150–400)
RBC: 5.25 MIL/uL (ref 4.22–5.81)
RDW: 12.5 % (ref 11.5–15.5)
WBC: 7.1 10*3/uL (ref 4.0–10.5)

## 2016-06-22 MED ORDER — CHLORHEXIDINE GLUCONATE CLOTH 2 % EX PADS: 6.0000 | MEDICATED_PAD | Freq: Once | CUTANEOUS | Status: DC

## 2016-06-22 NOTE — Progress Notes (Signed)
Mupirocin script called into the [facility]

## 2016-06-22 NOTE — Progress Notes (Addendum)
Cardiologist denies  Medical Md is Dr.Daniel [name]  Echo unsure   Stress test 6+ yrs ago  Heart cath denies  EKG denies in past yr   CXR denies in past yr  Sleep study in epic from 2015

## 2016-06-23 ENCOUNTER — Other Ambulatory Visit: Payer: Self-pay

## 2016-06-23 NOTE — Patient Outreach (Signed)
[facility]) Care Management  06/23/2016  Joshua [name] 06-05-1950 [id number]   Subjective: none  Objective: Per chart review, patient with spondylosis with radiculopathy, cervical region is scheduled for surgery on 06/27/16. History of obstructive sleep apnea, spinal stenosis, persistent hypersomnia.  Assessment:  Received [facility] Pre-surgical call referral on 06/16/16. Telephone call to patient's home / mobile number, no answer. HIPPA compliant voice message left. Pre-surgical call pending patient contact.   Plan: RNCM will call patient for 2nd telephone outreach attempt within the week if no return call.  Thea Silversmith, RN, MSN, [employer] Coordinator Cell: [phone]

## 2016-06-26 ENCOUNTER — Other Ambulatory Visit: Payer: Self-pay

## 2016-06-26 DIAGNOSIS — M5020 Other cervical disc displacement, unspecified cervical region: Secondary | ICD-10-CM | POA: Diagnosis not present

## 2016-06-26 NOTE — Patient Outreach (Signed)
[facility]) Care Management  06/26/2016  JASIYAH PAULDING 06-Mar-1950 [id number]   Subjective: none  Objective: Per chart review, patient with spondylosis with radiculopathy, cervical region is scheduled for surgery on 06/27/16. History of obstructive sleep apnea, spinal stenosis, persistent hypersomnia.  Assessment:  Received [facility] Pre-surgical call referral on 06/16/16. Telephone call to patient's home / mobile number, no answer. HIPPA compliant voice message left. Pre-surgical call pending patient contact.   Plan: RNCM will await return call. Update RNCM completing post procedure call if no return call.   Thea Silversmith, RN, MSN, [place] Coordinator Cell: [phone]

## 2016-06-27 ENCOUNTER — Other Ambulatory Visit: Payer: Self-pay

## 2016-06-27 NOTE — Anesthesia Preprocedure Evaluation (Addendum)
Anesthesia Evaluation  Patient identified by MRN, date of birth, ID band Patient awake    Reviewed: Allergy & Precautions, H&P , NPO status , Patient's Chart, lab work & pertinent test results  Airway Mallampati: II  TM Distance: >3 FB Neck ROM: Full    Dental no notable dental hx. (+) Teeth Intact, Dental Advisory Given   Pulmonary neg pulmonary ROS, sleep apnea ,    Pulmonary exam normal breath sounds clear to auscultation       Cardiovascular Exercise Tolerance: Good hypertension, Pt. on medications and Pt. on home beta blockers negative cardio ROS   Rhythm:Regular Rate:Normal     Neuro/Psych Anxiety Depression negative neurological ROS     GI/Hepatic negative GI ROS, Neg liver ROS,   Endo/Other  negative endocrine ROS  Renal/GU negative Renal ROS  negative genitourinary   Musculoskeletal  (+) Arthritis , Osteoarthritis,    Abdominal   Peds  Hematology negative hematology ROS (+) anemia ,   Anesthesia Other Findings   Reproductive/Obstetrics negative OB ROS                            Anesthesia Physical Anesthesia Plan  ASA: II  Anesthesia Plan: General   Post-op Pain Management:    Induction: Intravenous  Airway Management Planned: Oral ETT  Additional Equipment:   Intra-op Plan:   Post-operative Plan: Extubation in OR  Informed Consent: I have reviewed the patients History and Physical, chart, labs and discussed the procedure including the risks, benefits and alternatives for the proposed anesthesia with the patient or authorized representative who has indicated his/her understanding and acceptance.   Dental advisory given  Plan Discussed with: CRNA  Anesthesia Plan Comments:        Anesthesia Quick Evaluation

## 2016-06-27 NOTE — Patient Outreach (Addendum)
[facility]) Care Management  06/27/2016  BAILEY KOLBE 19-Oct-1950 [id number]   Subjective:Telephone call to patient. Discussed UMR pre-op call. Patient voices understanding and agrees to pre-op call.    Objective:Per chart review, patient withspondylosis with radiculopathy, cervical region is scheduled forsurgery on 06/27/16. History of obstructive sleep apnea, spinal stenosis, persistent hypersomnia.  Assessment: Received UMR pre-op referral on 5/11. Pre-Op call completed.  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 RNCM reinforced the benefits of FMLA for his wife. He Mr. Dwan is unclear if his wife selected the [facility]. He states he will follow up with her.     Post procedure equipment/home health- RNCM reinforced that the inpatient case manager in the hospital will arrange if he has any of these needs prior to discharge.   RNCM discussed [place] benefit is higher when using a [facility] facility/pharmacy. RNCM reinforced that if he is discharged home on the weekend with a new prescription that he needs filled that he will have to use a local pharmacy due to [facility] pharmacies are not open on the weekend.     Support:  Client reports that he has someone to assist with pharmacy needs at discharge. Client reports he has transportation to his follow up appointment.     Discussed Advanced Directives. RNCM reinforced that Spiritual care department available to assist [employer] employees with Advanced Directives as needed.  No other medical issues identified and no additional community resource information needs at this time. Patient is agreeable to follow up post procedure call.  Plan: telephonic RNCM will follow up post discharge within 3 business days of notification.  Thea Silversmith, RN, MSN, [place] Coordinator Cell: [phone]

## 2016-06-28 ENCOUNTER — Inpatient Hospital Stay (HOSPITAL_COMMUNITY): Payer: 59

## 2016-06-28 ENCOUNTER — Encounter (HOSPITAL_COMMUNITY)
Admission: RE | Disposition: A | Discharge status: Home / Self Care | Payer: Self-pay | Source: Ambulatory Visit | Attending: Neurosurgery

## 2016-06-28 ENCOUNTER — Inpatient Hospital Stay (HOSPITAL_COMMUNITY): Payer: 59 | Admitting: Anesthesiology

## 2016-06-28 ENCOUNTER — Encounter (HOSPITAL_COMMUNITY): Payer: Self-pay | Admitting: Urology

## 2016-06-28 ENCOUNTER — Observation Stay (HOSPITAL_COMMUNITY)
Admission: RE | Admit: 2016-06-28 | Discharge: 2016-06-29 | Disposition: A | Discharge status: Home / Self Care | Payer: 59 | Source: Ambulatory Visit | Attending: Neurosurgery | Admitting: Neurosurgery

## 2016-06-28 DIAGNOSIS — M792 Neuralgia and neuritis, unspecified: Secondary | ICD-10-CM | POA: Diagnosis not present

## 2016-06-28 DIAGNOSIS — E785 Hyperlipidemia, unspecified: Secondary | ICD-10-CM | POA: Insufficient documentation

## 2016-06-28 DIAGNOSIS — M5030 Other cervical disc degeneration, unspecified cervical region: Secondary | ICD-10-CM | POA: Diagnosis not present

## 2016-06-28 DIAGNOSIS — M5021 Other cervical disc displacement,  high cervical region: Secondary | ICD-10-CM | POA: Diagnosis not present

## 2016-06-28 DIAGNOSIS — M4802 Spinal stenosis, cervical region: Secondary | ICD-10-CM | POA: Insufficient documentation

## 2016-06-28 DIAGNOSIS — F329 Major depressive disorder, single episode, unspecified: Secondary | ICD-10-CM | POA: Insufficient documentation

## 2016-06-28 DIAGNOSIS — Z7982 Long term (current) use of aspirin: Secondary | ICD-10-CM | POA: Diagnosis not present

## 2016-06-28 DIAGNOSIS — M62838 Other muscle spasm: Secondary | ICD-10-CM | POA: Insufficient documentation

## 2016-06-28 DIAGNOSIS — Z419 Encounter for procedure for purposes other than remedying health state, unspecified: Secondary | ICD-10-CM

## 2016-06-28 DIAGNOSIS — M19011 Primary osteoarthritis, right shoulder: Secondary | ICD-10-CM | POA: Diagnosis not present

## 2016-06-28 DIAGNOSIS — M47812 Spondylosis without myelopathy or radiculopathy, cervical region: Secondary | ICD-10-CM | POA: Diagnosis not present

## 2016-06-28 DIAGNOSIS — G4733 Obstructive sleep apnea (adult) (pediatric): Secondary | ICD-10-CM | POA: Insufficient documentation

## 2016-06-28 DIAGNOSIS — M5020 Other cervical disc displacement, unspecified cervical region: Secondary | ICD-10-CM | POA: Diagnosis present

## 2016-06-28 DIAGNOSIS — Z79899 Other long term (current) drug therapy: Secondary | ICD-10-CM | POA: Diagnosis not present

## 2016-06-28 DIAGNOSIS — I10 Essential (primary) hypertension: Secondary | ICD-10-CM | POA: Insufficient documentation

## 2016-06-28 DIAGNOSIS — M4722 Other spondylosis with radiculopathy, cervical region: Secondary | ICD-10-CM | POA: Diagnosis not present

## 2016-06-28 DIAGNOSIS — M4322 Fusion of spine, cervical region: Secondary | ICD-10-CM | POA: Diagnosis not present

## 2016-06-28 HISTORY — PX: ANTERIOR CERVICAL DECOMP/DISCECTOMY FUSION: SHX1161

## 2016-06-28 SURGERY — ANTERIOR CERVICAL DECOMPRESSION/DISCECTOMY FUSION 3 LEVELS
Anesthesia: General

## 2016-06-28 MED ORDER — MAGNESIUM HYDROXIDE 400 MG/5ML PO SUSP: 30.0000 mL | Freq: Every day | ORAL | Status: DC | PRN

## 2016-06-28 MED ORDER — MORPHINE SULFATE (PF) 4 MG/ML IV SOLN
4.0000 mg | INTRAVENOUS | Status: DC | PRN
Start: 2016-06-28 — End: 2016-06-29

## 2016-06-28 MED ORDER — ONDANSETRON HCL 4 MG/2ML IJ SOLN
INTRAMUSCULAR | Status: AC
  Filled 2016-06-28: qty 2

## 2016-06-28 MED ORDER — SODIUM CHLORIDE 0.9% FLUSH
3.0000 mL | Freq: Two times a day (BID) | INTRAVENOUS | Status: DC
  Administered 2016-06-28: 3 mL via INTRAVENOUS

## 2016-06-28 MED ORDER — EPHEDRINE 5 MG/ML INJ
INTRAVENOUS | Status: AC
  Filled 2016-06-28: qty 10

## 2016-06-28 MED ORDER — ACETAMINOPHEN 10 MG/ML IV SOLN
INTRAVENOUS | Status: AC
  Filled 2016-06-28: qty 100

## 2016-06-28 MED ORDER — BACITRACIN 50000 UNITS IM SOLR
INTRAMUSCULAR | Status: DC | PRN
  Administered 2016-06-28: 07:00:00

## 2016-06-28 MED ORDER — HYDROMORPHONE HCL 1 MG/ML IJ SOLN: 0.2500 mg | INTRAMUSCULAR | Status: DC | PRN

## 2016-06-28 MED ORDER — PHENYLEPHRINE HCL 10 MG/ML IJ SOLN
INTRAVENOUS | Status: DC | PRN
  Administered 2016-06-28: 25 ug/min via INTRAVENOUS

## 2016-06-28 MED ORDER — LACTATED RINGERS IV SOLN
INTRAVENOUS | Status: DC | PRN
  Administered 2016-06-28 (×3): via INTRAVENOUS

## 2016-06-28 MED ORDER — PROPOFOL 10 MG/ML IV BOLUS
INTRAVENOUS | Status: DC | PRN
  Administered 2016-06-28: 170 mg via INTRAVENOUS

## 2016-06-28 MED ORDER — PHENOL 1.4 % MT LIQD: 1.0000 | OROMUCOSAL | Status: DC | PRN

## 2016-06-28 MED ORDER — SUGAMMADEX SODIUM 200 MG/2ML IV SOLN
INTRAVENOUS | Status: DC | PRN
Start: 2016-06-28 — End: 2016-06-28
  Administered 2016-06-28: 150 mg via INTRAVENOUS

## 2016-06-28 MED ORDER — CYCLOBENZAPRINE HCL 5 MG PO TABS
5.0000 mg | ORAL_TABLET | Freq: Three times a day (TID) | ORAL | Status: DC | PRN
  Administered 2016-06-29: 10 mg via ORAL
  Filled 2016-06-28: qty 2

## 2016-06-28 MED ORDER — LIDOCAINE 2% (20 MG/ML) 5 ML SYRINGE
INTRAMUSCULAR | Status: AC
  Filled 2016-06-28: qty 5

## 2016-06-28 MED ORDER — KETOROLAC TROMETHAMINE 30 MG/ML IJ SOLN
INTRAMUSCULAR | Status: AC
  Filled 2016-06-28: qty 1

## 2016-06-28 MED ORDER — ARTIFICIAL TEARS OPHTHALMIC OINT
TOPICAL_OINTMENT | OPHTHALMIC | Status: DC | PRN
  Administered 2016-06-28: 1 via OPHTHALMIC

## 2016-06-28 MED ORDER — KETOROLAC TROMETHAMINE 30 MG/ML IJ SOLN
30.0000 mg | Freq: Once | INTRAMUSCULAR | Status: AC
  Administered 2016-06-28: 30 mg via INTRAVENOUS

## 2016-06-28 MED ORDER — ONDANSETRON HCL 4 MG/2ML IJ SOLN
INTRAMUSCULAR | Status: DC | PRN
  Administered 2016-06-28: 4 mg via INTRAVENOUS

## 2016-06-28 MED ORDER — MENTHOL 3 MG MT LOZG: 1.0000 | LOZENGE | OROMUCOSAL | Status: DC | PRN

## 2016-06-28 MED ORDER — LOSARTAN POTASSIUM-HCTZ 50-12.5 MG PO TABS: 1.0000 | ORAL_TABLET | Freq: Every day | ORAL | Status: DC

## 2016-06-28 MED ORDER — ACETAMINOPHEN 325 MG PO TABS: 650.0000 mg | ORAL_TABLET | ORAL | Status: DC | PRN

## 2016-06-28 MED ORDER — LOSARTAN POTASSIUM 50 MG PO TABS
50.0000 mg | ORAL_TABLET | Freq: Every day | ORAL | Status: DC
  Filled 2016-06-28 (×2): qty 1

## 2016-06-28 MED ORDER — BUPROPION HCL ER (SR) 100 MG PO TB12
100.0000 mg | ORAL_TABLET | Freq: Two times a day (BID) | ORAL | Status: DC
  Administered 2016-06-28: 100 mg via ORAL
  Filled 2016-06-28 (×2): qty 1

## 2016-06-28 MED ORDER — PROPOFOL 10 MG/ML IV BOLUS
INTRAVENOUS | Status: AC
  Filled 2016-06-28: qty 40

## 2016-06-28 MED ORDER — ONDANSETRON HCL 4 MG/2ML IJ SOLN: 4.0000 mg | Freq: Four times a day (QID) | INTRAMUSCULAR | Status: DC | PRN

## 2016-06-28 MED ORDER — FENTANYL CITRATE (PF) 250 MCG/5ML IJ SOLN
INTRAMUSCULAR | Status: AC
  Filled 2016-06-28: qty 5

## 2016-06-28 MED ORDER — PHENYLEPHRINE 40 MCG/ML (10ML) SYRINGE FOR IV PUSH (FOR BLOOD PRESSURE SUPPORT)
PREFILLED_SYRINGE | INTRAVENOUS | Status: AC
  Filled 2016-06-28: qty 10

## 2016-06-28 MED ORDER — MIDAZOLAM HCL 5 MG/5ML IJ SOLN
INTRAMUSCULAR | Status: DC | PRN
  Administered 2016-06-28: 2 mg via INTRAVENOUS

## 2016-06-28 MED ORDER — DEXAMETHASONE SODIUM PHOSPHATE 10 MG/ML IJ SOLN
INTRAMUSCULAR | Status: AC
  Filled 2016-06-28: qty 1

## 2016-06-28 MED ORDER — BUPIVACAINE HCL (PF) 0.5 % IJ SOLN
INTRAMUSCULAR | Status: AC
Start: 2016-06-28 — End: 2016-06-28
  Filled 2016-06-28: qty 30

## 2016-06-28 MED ORDER — BUPIVACAINE HCL (PF) 0.5 % IJ SOLN
INTRAMUSCULAR | Status: DC | PRN
  Administered 2016-06-28: 18 mL

## 2016-06-28 MED ORDER — THROMBIN 5000 UNITS EX SOLR
CUTANEOUS | Status: AC
  Filled 2016-06-28: qty 5000

## 2016-06-28 MED ORDER — THROMBIN 20000 UNITS EX SOLR
CUTANEOUS | Status: DC | PRN
  Administered 2016-06-28: 07:00:00 via TOPICAL

## 2016-06-28 MED ORDER — ACETAMINOPHEN 10 MG/ML IV SOLN
INTRAVENOUS | Status: DC | PRN
  Administered 2016-06-28: 1000 mg via INTRAVENOUS

## 2016-06-28 MED ORDER — FENTANYL CITRATE (PF) 100 MCG/2ML IJ SOLN
INTRAMUSCULAR | Status: DC | PRN
  Administered 2016-06-28 (×2): 50 ug via INTRAVENOUS
  Administered 2016-06-28: 25 ug via INTRAVENOUS
  Administered 2016-06-28 (×3): 50 ug via INTRAVENOUS

## 2016-06-28 MED ORDER — THROMBIN 5000 UNITS EX SOLR
OROMUCOSAL | Status: DC | PRN
  Administered 2016-06-28: 07:00:00 via TOPICAL

## 2016-06-28 MED ORDER — SIMVASTATIN 40 MG PO TABS
40.0000 mg | ORAL_TABLET | Freq: Every day | ORAL | Status: DC
  Administered 2016-06-28: 40 mg via ORAL
  Filled 2016-06-28: qty 1
  Filled 2016-06-28: qty 2
  Filled 2016-06-28: qty 1

## 2016-06-28 MED ORDER — HYDROCODONE-ACETAMINOPHEN 5-325 MG PO TABS
1.0000 | ORAL_TABLET | ORAL | Status: DC | PRN
  Administered 2016-06-28 – 2016-06-29 (×2): 2 via ORAL
  Filled 2016-06-28 (×2): qty 2

## 2016-06-28 MED ORDER — LIDOCAINE-EPINEPHRINE 1 %-1:100000 IJ SOLN
INTRAMUSCULAR | Status: AC
  Filled 2016-06-28: qty 1

## 2016-06-28 MED ORDER — ONDANSETRON HCL 4 MG PO TABS: 4.0000 mg | ORAL_TABLET | Freq: Four times a day (QID) | ORAL | Status: DC | PRN

## 2016-06-28 MED ORDER — KETOROLAC TROMETHAMINE 30 MG/ML IJ SOLN
30.0000 mg | Freq: Four times a day (QID) | INTRAMUSCULAR | Status: DC
  Administered 2016-06-28: 30 mg via INTRAVENOUS
  Filled 2016-06-28 (×2): qty 1

## 2016-06-28 MED ORDER — 0.9 % SODIUM CHLORIDE (POUR BTL) OPTIME
TOPICAL | Status: DC | PRN
  Administered 2016-06-28: 1000 mL

## 2016-06-28 MED ORDER — METOPROLOL SUCCINATE ER 25 MG PO TB24
25.0000 mg | ORAL_TABLET | Freq: Every day | ORAL | Status: DC
  Filled 2016-06-28: qty 1

## 2016-06-28 MED ORDER — LIDOCAINE-EPINEPHRINE 1 %-1:100000 IJ SOLN
INTRAMUSCULAR | Status: DC | PRN
  Administered 2016-06-28: 18 mL

## 2016-06-28 MED ORDER — EPHEDRINE SULFATE 50 MG/ML IJ SOLN
INTRAMUSCULAR | Status: DC | PRN
  Administered 2016-06-28 (×7): 10 mg via INTRAVENOUS

## 2016-06-28 MED ORDER — HYDROXYZINE HCL 25 MG PO TABS: 50.0000 mg | ORAL_TABLET | ORAL | Status: DC | PRN

## 2016-06-28 MED ORDER — FLEET ENEMA 7-19 GM/118ML RE ENEM: 1.0000 | ENEMA | Freq: Once | RECTAL | Status: DC | PRN

## 2016-06-28 MED ORDER — ROCURONIUM BROMIDE 100 MG/10ML IV SOLN
INTRAVENOUS | Status: DC | PRN
  Administered 2016-06-28: 50 mg via INTRAVENOUS
  Administered 2016-06-28: 20 mg via INTRAVENOUS
  Administered 2016-06-28: 10 mg via INTRAVENOUS
  Administered 2016-06-28 (×2): 20 mg via INTRAVENOUS

## 2016-06-28 MED ORDER — SODIUM CHLORIDE 0.9% FLUSH: 3.0000 mL | INTRAVENOUS | Status: DC | PRN

## 2016-06-28 MED ORDER — ROCURONIUM BROMIDE 10 MG/ML (PF) SYRINGE
PREFILLED_SYRINGE | INTRAVENOUS | Status: AC
  Filled 2016-06-28: qty 5

## 2016-06-28 MED ORDER — ARTIFICIAL TEARS OPHTHALMIC OINT
TOPICAL_OINTMENT | OPHTHALMIC | Status: AC
  Filled 2016-06-28: qty 3.5

## 2016-06-28 MED ORDER — ALUM & MAG HYDROXIDE-SIMETH 200-200-20 MG/5ML PO SUSP: 30.0000 mL | Freq: Four times a day (QID) | ORAL | Status: DC | PRN

## 2016-06-28 MED ORDER — LIDOCAINE HCL (CARDIAC) 20 MG/ML IV SOLN
INTRAVENOUS | Status: DC | PRN
  Administered 2016-06-28: 40 mg via INTRAVENOUS

## 2016-06-28 MED ORDER — BISACODYL 10 MG RE SUPP: 10.0000 mg | Freq: Every day | RECTAL | Status: DC | PRN

## 2016-06-28 MED ORDER — THROMBIN 20000 UNITS EX SOLR
CUTANEOUS | Status: AC
  Filled 2016-06-28: qty 20000

## 2016-06-28 MED ORDER — HYDROCHLOROTHIAZIDE 12.5 MG PO CAPS
12.5000 mg | ORAL_CAPSULE | Freq: Every day | ORAL | Status: DC
  Filled 2016-06-28 (×2): qty 1

## 2016-06-28 MED ORDER — GABAPENTIN 300 MG PO CAPS
300.0000 mg | ORAL_CAPSULE | Freq: Two times a day (BID) | ORAL | Status: DC
  Administered 2016-06-28: 300 mg via ORAL
  Filled 2016-06-28 (×2): qty 1

## 2016-06-28 MED ORDER — DEXAMETHASONE SODIUM PHOSPHATE 10 MG/ML IJ SOLN
INTRAMUSCULAR | Status: DC | PRN
  Administered 2016-06-28: 10 mg via INTRAVENOUS

## 2016-06-28 MED ORDER — HYDROXYZINE HCL 50 MG/ML IM SOLN: 50.0000 mg | INTRAMUSCULAR | Status: DC | PRN

## 2016-06-28 MED ORDER — SUGAMMADEX SODIUM 200 MG/2ML IV SOLN
INTRAVENOUS | Status: AC
  Filled 2016-06-28: qty 2

## 2016-06-28 MED ORDER — DEXTROSE-NACL 5-0.45 % IV SOLN: INTRAVENOUS | Status: DC

## 2016-06-28 MED ORDER — MIDAZOLAM HCL 2 MG/2ML IJ SOLN
INTRAMUSCULAR | Status: AC
  Filled 2016-06-28: qty 2

## 2016-06-28 MED ORDER — SODIUM CHLORIDE 0.9 % IV SOLN: 250.0000 mL | INTRAVENOUS | Status: DC

## 2016-06-28 MED ORDER — METHOCARBAMOL 500 MG PO TABS: 500.0000 mg | ORAL_TABLET | Freq: Three times a day (TID) | ORAL | Status: DC | PRN

## 2016-06-28 MED ORDER — PHENYLEPHRINE HCL 10 MG/ML IJ SOLN
INTRAMUSCULAR | Status: DC | PRN
  Administered 2016-06-28: 40 ug via INTRAVENOUS
  Administered 2016-06-28: 80 ug via INTRAVENOUS

## 2016-06-28 MED ORDER — CEFAZOLIN SODIUM-DEXTROSE 2-4 GM/100ML-% IV SOLN
2.0000 g | INTRAVENOUS | Status: AC
  Administered 2016-06-28: 2 g via INTRAVENOUS
  Filled 2016-06-28: qty 100

## 2016-06-28 MED ORDER — ACETAMINOPHEN 650 MG RE SUPP: 650.0000 mg | RECTAL | Status: DC | PRN

## 2016-06-28 SURGICAL SUPPLY — 60 items
ADH SKN CLS APL DERMABOND .7 (GAUZE/BANDAGES/DRESSINGS) ×1
ALLOGRAFT CA 6X14X11 (Bone Implant) ×3 IMPLANT
BAG DECANTER FOR FLEXI CONT (MISCELLANEOUS) ×2 IMPLANT
BIT DRILL HYBRID 2.5X14 (BIT) ×1 IMPLANT
BIT DRILL NEURO 2X3.1 SFT TUCH (MISCELLANEOUS) ×1 IMPLANT
BLADE ULTRA TIP 2M (BLADE) ×2 IMPLANT
CANISTER SUCT 3000ML PPV (MISCELLANEOUS) ×2 IMPLANT
CARTRIDGE OIL MAESTRO DRILL (MISCELLANEOUS) ×1 IMPLANT
COVER MAYO STAND STRL (DRAPES) ×2 IMPLANT
DERMABOND ADVANCED (GAUZE/BANDAGES/DRESSINGS) ×1
DERMABOND ADVANCED .7 DNX12 (GAUZE/BANDAGES/DRESSINGS) ×1 IMPLANT
DIFFUSER DRILL AIR PNEUMATIC (MISCELLANEOUS) ×2 IMPLANT
DRAPE HALF SHEET 40X57 (DRAPES) ×1 IMPLANT
DRAPE LAPAROTOMY 100X72 PEDS (DRAPES) ×2 IMPLANT
DRAPE MICROSCOPE LEICA (MISCELLANEOUS) ×2 IMPLANT
DRAPE POUCH INSTRU U-SHP 10X18 (DRAPES) ×2 IMPLANT
DRILL NEURO 2X3.1 SOFT TOUCH (MISCELLANEOUS) ×4
ELECT COATED BLADE 2.86 ST (ELECTRODE) ×2 IMPLANT
ELECT REM PT RETURN 9FT ADLT (ELECTROSURGICAL) ×2
ELECTRODE REM PT RTRN 9FT ADLT (ELECTROSURGICAL) ×1 IMPLANT
GLOVE BIOGEL PI IND STRL 8 (GLOVE) ×1 IMPLANT
GLOVE BIOGEL PI INDICATOR 8 (GLOVE) ×1
GLOVE ECLIPSE 7.5 STRL STRAW (GLOVE) ×4 IMPLANT
GLOVE EXAM NITRILE XS STR PU (GLOVE) IMPLANT
GLOVE INDICATOR 7.5 STRL GRN (GLOVE) ×3 IMPLANT
GLOVE INDICATOR 8.0 STRL GRN (GLOVE) ×2 IMPLANT
GLOVE SS BIOGEL STRL SZ 7.5 (GLOVE) IMPLANT
GLOVE SUPERSENSE BIOGEL SZ 7.5 (GLOVE) ×1
GOWN STRL REUS W/ TWL LRG LVL3 (GOWN DISPOSABLE) IMPLANT
GOWN STRL REUS W/ TWL XL LVL3 (GOWN DISPOSABLE) IMPLANT
GOWN STRL REUS W/TWL 2XL LVL3 (GOWN DISPOSABLE) ×1 IMPLANT
GOWN STRL REUS W/TWL LRG LVL3 (GOWN DISPOSABLE) ×2
GOWN STRL REUS W/TWL XL LVL3 (GOWN DISPOSABLE) ×2
HALTER HD/CHIN CERV TRACTION D (MISCELLANEOUS) ×2 IMPLANT
HEMOSTAT POWDER KIT SURGIFOAM (HEMOSTASIS) ×2 IMPLANT
KIT BASIN OR (CUSTOM PROCEDURE TRAY) ×2 IMPLANT
KIT ROOM TURNOVER OR (KITS) ×2 IMPLANT
NDL HYPO 25X1 1.5 SAFETY (NEEDLE) ×1 IMPLANT
NDL SPNL 22GX3.5 QUINCKE BK (NEEDLE) ×1 IMPLANT
NEEDLE HYPO 25X1 1.5 SAFETY (NEEDLE) ×2 IMPLANT
NEEDLE SPNL 22GX3.5 QUINCKE BK (NEEDLE) ×2 IMPLANT
NS IRRIG 1000ML POUR BTL (IV SOLUTION) ×2 IMPLANT
OIL CARTRIDGE MAESTRO DRILL (MISCELLANEOUS) ×2
PACK LAMINECTOMY NEURO (CUSTOM PROCEDURE TRAY) ×2 IMPLANT
PAD ARMBOARD 7.5X6 YLW CONV (MISCELLANEOUS) ×6 IMPLANT
PATTIES SURGICAL 1X1 (DISPOSABLE) ×1 IMPLANT
PLATE CERV TI DYNATRAN 390X51 (Plate) ×1 IMPLANT
RUBBERBAND STERILE (MISCELLANEOUS) ×4 IMPLANT
SCREW R HYBIRD VA 4.0X14MM (Screw) ×6 IMPLANT
SCREW R HYBIRD VA 4.0X16MM (Screw) ×2 IMPLANT
SPONGE INTESTINAL PEANUT (DISPOSABLE) ×2 IMPLANT
SPONGE SURGIFOAM ABS GEL 100 (HEMOSTASIS) ×2 IMPLANT
STAPLER SKIN PROX WIDE 3.9 (STAPLE) ×1 IMPLANT
SUT VIC AB 0 CT1 18XCR BRD8 (SUTURE) IMPLANT
SUT VIC AB 0 CT1 8-18 (SUTURE)
SUT VIC AB 2-0 CP2 18 (SUTURE) ×2 IMPLANT
SUT VIC AB 3-0 SH 8-18 (SUTURE) ×3 IMPLANT
TOWEL GREEN STERILE (TOWEL DISPOSABLE) ×2 IMPLANT
TOWEL GREEN STERILE FF (TOWEL DISPOSABLE) IMPLANT
WATER STERILE IRR 1000ML POUR (IV SOLUTION) ×2 IMPLANT

## 2016-06-28 NOTE — H&P (Signed)
Subjective: Patient is a 66 y.o. 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  Symptoms began following exercising in the gym.  He developed significant pain and weakness, the pain is lessened but the weakness has persisted. He is admitted now for a 3 level C3-4, C4-5, and C5-6 anterior cervical decompression arthrodesis with structural allograft and cervical plating.    Patient Active Problem List   Diagnosis Date Noted  . Spinal stenosis of cervical region 05/02/2016  . Primary osteoarthritis, right shoulder 05/02/2016  . OSA (obstructive sleep apnea) 12/01/2013  . ERRONEOUS ENCOUNTER--DISREGARD 10/27/2013  . Hypersomnia, persistent 10/03/2013   Past Medical History:  Diagnosis Date  . Anemia    as a child  . Anxiety   . Arthritis   . Depression    takes Wellbutrin daily  . Hyperlipidemia    takes Simvastatin daily  . Hypertension    takes Metoprolol and Hyzaar daily   . Muscle spasm    takes Robaxin as needed  . Nerve pain    takes Gabapentin as needed  . OSA (obstructive sleep apnea)   . OSA (obstructive sleep apnea) 12/01/2013   16.6 AHI and RDI of over 30. REM AHI over 30.    . 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 Urinary frequency   . Weakness    numbness and tingling in right arm down to fingers    Past Surgical History:  Procedure Laterality Date  . COLONOSCOPY    . lap inguinal hernia repair wiht mesh Bilateral 10/2003  . REFRACTIVE SURGERY  2003  . TONSILLECTOMY    . wisdom teeth extracted      Prescriptions Prior to Admission  Medication Sig Dispense Refill Last Dose  . aspirin 81 MG tablet Take 81 mg by mouth daily.   06/26/2016  . buPROPion (WELLBUTRIN SR) 100 MG 12 hr tablet Take 100 mg by mouth 2 (two) times daily.   06/28/2016 at 0430  .  DEPO-TESTOSTERONE 200 MG/ML injection Inject 100 mg into the muscle every 21 ( twenty-one) days.   3 Past Month at Unknown time  . gabapentin (NEURONTIN) 300 MG capsule Take 300 mg by mouth 2 (two) times daily. To reduce pain   06/28/2016 at 0430  . losartan-hydrochlorothiazide (HYZAAR) 50-12.5 MG per tablet Take 1 tablet by mouth daily.   06/27/2016 at Unknown time  . methylphenidate (RITALIN SR) 20 MG ER tablet Take 20 mg by mouth daily as needed.    Past Week at Unknown time  . metoprolol succinate (TOPROL-XL) 25 MG 24 hr tablet Take 25 mg by mouth daily.   06/28/2016 at 0430  . Multiple Vitamin (MULTIVITAMINS PO) Take 1 tablet by mouth daily.   Past Week at Unknown time  . simvastatin (ZOCOR) 40 MG tablet Take 40 mg by mouth daily.   06/27/2016 at Unknown time  . traMADol (ULTRAM) 50 MG tablet Take 1 tablet (50 mg total) by mouth every 6 (six) hours as needed. 30 tablet 0 06/28/2016 at 0430  . famciclovir (FAMVIR) 125 MG tablet Take 125 mg by mouth 2 (two) times daily as needed.   Unknown at Unknown time  . methocarbamol (ROBAXIN) 500 MG tablet Take 1 tablet (500 mg total) by mouth every 8 (eight) hours as needed for muscle spasms. 50 tablet 0 More than a month at  Unknown time   Allergies  Allergen Reactions  . No Known Allergies     Social History  Substance Use Topics  . Smoking status: Never Smoker  . Smokeless tobacco: Never Used  . Alcohol use Yes     Comment: socially    Family History  Problem Relation Age of Onset  . Lung disease Father   . Parkinson's disease Father   . Lymphoma Mother      Review of Systems A comprehensive review of systems was negative.  Objective: Vital signs in last 24 hours: Temp:  [98.1 F (36.7 C)] 98.1 F (36.7 C) (05/23 0552) Pulse Rate:  [59] 59 (05/23 0552) Resp:  [20] 20 (05/23 0552) BP: (167)/(92) 167/92 (05/23 0552) SpO2:  [97 %] 97 % (05/23 0552)  EXAM: Patient well-developed well-nourished white male in no acute distress. Lungs are  clear to auscultation , the patient has symmetrical respiratory excursion. Heart has a regular rate and rhythm normal S1 and S2 no murmur.   Abdomen is soft nontender nondistended bowel sounds are present. Extremity examination shows no clubbing cyanosis or edema. Neurologic examination shows 5/5 strength to the left upper extremity including the deltoid, biceps, triceps, intrinsics, and grip. However the range of motion shows the deltoid is 4, the biceps is 4 minus, the triceps is 4, and the grip is 4, the intrinsics are 5. Iliopsoas are 5/5 bilaterally. Sensation is intact to pinprick in the distal upper and lower extremities. Reflexes show the biceps and brachial radialis are absent by. Left triceps is 1, right triceps is absent. Quadriceps and gastrocnemeus is absent bilateral. Toes are downgoing bilaterally. He has a normal gait and stance.  Data Review:CBC    Component Value Date/Time   WBC 7.1 06/22/2016 0852   RBC 5.25 06/22/2016 0852   HGB 15.7 06/22/2016 0852   HCT 46.9 06/22/2016 0852   PLT 253 06/22/2016 0852   MCV 89.3 06/22/2016 0852   MCH 29.9 06/22/2016 0852   MCHC 33.5 06/22/2016 0852   RDW 12.5 06/22/2016 0852                          BMET    Component Value Date/Time   NA 140 06/22/2016 0852   NA 142 10/03/2013 1111   K 4.2 06/22/2016 0852   CL 105 06/22/2016 0852   CO2 27 06/22/2016 0852   GLUCOSE 73 06/22/2016 0852   BUN 24 (H) 06/22/2016 0852   BUN 23 10/03/2013 1111   CREATININE 1.14 06/22/2016 0852   CALCIUM 9.1 06/22/2016 0852   GFRNONAA >60 06/22/2016 0852   GFRAA >60 06/22/2016 7902     Assessment/Plan: Patient with multilevel right with radiculopathy with advanced multilevel cervical degenerative disease and spondylosis, and is admitted now for 3 level ACDF.  I've discussed with the patient the nature of his condition, the nature the surgical procedure, the typical length of surgery, hospital stay, and overall recuperation. We discussed limitations  postoperatively. I discussed risks of surgery including risks of infection, bleeding, possibly need for transfusion, the risk of nerve root dysfunction with pain, weakness, numbness, or paresthesias, the risk of spinal cord dysfunction with paralysis of all 4 limbs and quadriplegia, and the risk of dural tear and CSF leakage and possible need for further surgery, the risk of esophageal dysfunction causing dysphagia and the risk of laryngeal dysfunction causing hoarseness of the voice, the risk of failure of the arthrodesis and the possible need for further surgery,  and the risk of anesthetic complications including myocardial infarction, stroke, pneumonia, and death. We also discussed the need for postoperative immobilization in a cervical collar. Understanding all this the patient does wish to proceed with surgery and is admitted for such.    Hosie Spangle, MD 06/28/2016 7:23 AM

## 2016-06-28 NOTE — Transfer of Care (Signed)
Immediate Anesthesia Transfer of Care Note  Patient: Joshua [name]  Procedure(s) Performed: Procedure(s): ANTERIOR CERVICAL DECOMPRESSION FUSION CERVICAL THREE-FOUR ,CERVICAL FOUR-FIVE,CERVICAL FIVE-SIX (N/A)  Patient Location: PACU  Anesthesia Type:General  Level of Consciousness: sedated  Airway & Oxygen Therapy: Patient Spontanous Breathing and Patient connected to nasal cannula oxygen  Post-op Assessment: Report given to RN and Post -op Vital signs reviewed and stable  Post vital signs: Reviewed and stable  Last Vitals:  Vitals:   06/28/16 0552 06/28/16 1158  BP: (!) 167/92 127/71  Pulse: (!) 59 81  Resp: 20 16  Temp: 36.7 C 36.9 C    Last Pain:  Vitals:   06/28/16 0552  TempSrc: Oral         Complications: No apparent anesthesia complications

## 2016-06-28 NOTE — Anesthesia Procedure Notes (Signed)
Procedure Name: Intubation Date/Time: 06/28/2016 7:50 AM Performed by: Suzy Bouchard Pre-anesthesia Checklist: Emergency Drugs available, Suction available, Patient being monitored, Timeout performed and Patient identified Patient Re-evaluated:Patient Re-evaluated prior to inductionOxygen Delivery Method: Circle system utilized Preoxygenation: Pre-oxygenation with 100% oxygen Intubation Type: IV induction Ventilation: Mask ventilation without difficulty Laryngoscope Size: Miller and 2 Grade View: Grade I Tube type: Oral Tube size: 7.5 mm Number of attempts: 1 Airway Equipment and Method: Stylet Placement Confirmation: ETT inserted through vocal cords under direct vision,  positive ETCO2 and breath sounds checked- equal and bilateral Secured at: 22 cm Tube secured with: Tape Dental Injury: Teeth and Oropharynx as per pre-operative assessment  Comments: Neck neutral for intubation

## 2016-06-28 NOTE — OR Nursing (Signed)
Foley catheter removed by Tommi Rumps RN end of procedure per verbal order Dr [name]

## 2016-06-28 NOTE — Progress Notes (Signed)
Vitals:   06/28/16 1315 06/28/16 1330 06/28/16 1345 06/28/16 1417  BP: 114/65 110/63 114/66 126/72  Pulse: 62 61 62 64  Resp: (!) 23 (!) 22 14 18   Temp:   98.1 F (36.7 C) 97.8 F (36.6 C)  TempSrc:      SpO2: 98% 98% 99% 94%    Patient sitting up in bed, comfortable. Has been up and ambulating. Voiding. Wound clean and dry, no swelling or drainage. Notes right deltoid somewhat weaker. Able to abduct up to about 75. Instructed patient, his wife, and a son in passive range of motion exercises for the right shoulder, which I explained to them we will want them doing at least 3 times a day.  Plan: Encouraged to ambulate. Continue to progress through postoperative recovery.  Hosie Spangle, MD 06/28/2016, 7:23 PM

## 2016-06-28 NOTE — Op Note (Signed)
06/28/2016  11:25 AM  PATIENT:  Lajuana Ripple  66 y.o. male  PRE-OPERATIVE DIAGNOSIS:  Multilevel cervical spondylosis with radiculopathy, cervical degenerative disease, cervical stenosis, cervical radiculopathy, right upper extremity weakness  POST-OPERATIVE DIAGNOSIS:  Multilevel cervical spondylosis with radiculopathy, cervical degenerative disease, cervical stenosis, cervical radiculopathy, right upper extremity weakness  PROCEDURE:  Procedure(s):  C3-4, C4-5, and C5-6 anterior cervical decompression and arthrodesis with structural allograft and Dynatran anterior cervical plating  SURGEON:  Surgeon(s): Jovita Gamma, MD Ditty, Kevan Ny, MD  ASSISTANTS: Cyndy Freeze, M.D.  ANESTHESIA:   general  EBL:  Total I/O In: 2000 [I.V.:2000] Out: 435 [Urine:360; Blood:75]  BLOOD ADMINISTERED:none  COUNT: Correct per nursing staff  DICTATION: Patient was brought to the operating room placed under general endotracheal anesthesia. Patient was placed in 10 pounds of halter traction. The neck was prepped with Betadine soap and solution and draped in a sterile fashion. A oblique incision was made on the left side of the neck paralleling the anterior border of the sternocleidomastoid.. The line of the incision was infiltrated with local anesthetic with epinephrine. Dissection was carried down thru the subcutaneous tissue and platysma, bipolar cautery was used to maintain hemostasis. Dissection was then carried out thru an avascular plane leaving the sternocleidomastoid carotid artery and jugular vein laterally and the trachea and esophagus medially. The ventral aspect of the vertebral column was identified and a localizing x-ray was taken. The C3-4, C4-5, and C5-6 levels were identified. The annulus at each level was incised and the disc space entered. Discectomy was performed with micro-curettes and pituitary rongeurs. The operating microscope was draped and brought into the field provided  additional magnification illumination and visualization. Discectomy was continued posteriorly thru the disc space and then the cartilaginous endplate was removed using micro-curettes along with the high-speed drill. We found the disc space to be very spondyliticly degenerated. Posterior osteophytic overgrowth was removed at each level using the high-speed drill along with a 2 mm thin footplated Kerrison punch. Posterior longitudinal ligament along with disc herniation was carefully removed, decompressing the spinal canal and thecal sac. We then continued to remove osteophytic overgrowth and disc material decompressing the neural foramina and exiting nerve roots bilaterally. Once the decompression was completed hemostasis was established at each level with the use of Gelfoam with thrombin. The Gelfoam was removed, the wound irrigated, thin layer Surgifoam was applied, and hemostasis confirmed. We then measured the height of each intravertebral disc space level and selected a 6 millimeter in height structural allograft for the C3-4 level, a 6 millimeter in height structural allograft for the C4-5 level, and a 6 millimeter in height structural allograft for the C5-6 level . Each was hydrated in saline solution and then gently positioned in the intravertebral disc space and countersunk. We then selected a 51 millimeter in height Dynatran cervical plate. It was positioned over the fusion construct and secured to the vertebra with 4 mm self-tapping variable screws. We used 14 mm screws at C3, C4, and C5, and 16 mm screws at C6. Each screw hole was hand drilled and then the screws placed, once all the screws were placed, final tightening was performed. The wound was irrigated with bacitracin solution checked for hemostasis which was established and confirmed. An x-ray was taken which showed the grafts in good position, the plate and screws in good position, and the overall alignment looked good. We then proceeded with  closure. The platysma was closed with interrupted inverted 2-0 undyed Vicryl suture, the subcutaneous and  subcuticular closed with interrupted inverted 3-0 undyed Vicryl suture. The skin edges were approximated with Dermabond. Following surgery the patient was taken out of cervical traction. To be reversed and the anesthetic and taken to the recovery room for further care.  PLAN OF CARE: Admit for overnight observation  PATIENT DISPOSITION:  PACU - hemodynamically stable.   Delay start of Pharmacological VTE agent (>24hrs) due to surgical blood loss or risk of bleeding:  yes

## 2016-06-28 NOTE — Anesthesia Postprocedure Evaluation (Signed)
Anesthesia Post Note  Patient: DAMARIO GILLIE  Procedure(s) Performed: Procedure(s) (LRB): ANTERIOR CERVICAL DECOMPRESSION FUSION CERVICAL THREE-FOUR ,CERVICAL FOUR-FIVE,CERVICAL FIVE-SIX (N/A)  Patient location during evaluation: PACU Anesthesia Type: General Level of consciousness: awake and alert Pain management: pain level controlled Vital Signs Assessment: post-procedure vital signs reviewed and stable Respiratory status: spontaneous breathing, nonlabored ventilation and respiratory function stable Cardiovascular status: blood pressure returned to baseline and stable Postop Assessment: no signs of nausea or vomiting Anesthetic complications: no       Last Vitals:  Vitals:   06/28/16 1330 06/28/16 1345  BP: 110/63 114/66  Pulse: 61 62  Resp: (!) 22 14  Temp:  36.7 C    Last Pain:  Vitals:   06/28/16 0552  TempSrc: Oral                 Maysie Parkhill,W. EDMOND

## 2016-06-28 NOTE — OR Nursing (Signed)
Handoff report given to [facility] RN [phone].  Joshua [name] ambulated to bed.  Incision site approximated with bruising surrounding incision.  Lateral bruising > medial bruising.  Site soft.

## 2016-06-29 ENCOUNTER — Encounter (HOSPITAL_COMMUNITY): Payer: Self-pay | Admitting: Neurosurgery

## 2016-06-29 DIAGNOSIS — M792 Neuralgia and neuritis, unspecified: Secondary | ICD-10-CM | POA: Diagnosis not present

## 2016-06-29 DIAGNOSIS — F329 Major depressive disorder, single episode, unspecified: Secondary | ICD-10-CM | POA: Diagnosis not present

## 2016-06-29 DIAGNOSIS — E785 Hyperlipidemia, unspecified: Secondary | ICD-10-CM | POA: Diagnosis not present

## 2016-06-29 DIAGNOSIS — M4802 Spinal stenosis, cervical region: Secondary | ICD-10-CM | POA: Diagnosis not present

## 2016-06-29 DIAGNOSIS — M5021 Other cervical disc displacement,  high cervical region: Secondary | ICD-10-CM | POA: Diagnosis not present

## 2016-06-29 DIAGNOSIS — M4722 Other spondylosis with radiculopathy, cervical region: Secondary | ICD-10-CM | POA: Diagnosis not present

## 2016-06-29 DIAGNOSIS — G4733 Obstructive sleep apnea (adult) (pediatric): Secondary | ICD-10-CM | POA: Diagnosis not present

## 2016-06-29 DIAGNOSIS — I10 Essential (primary) hypertension: Secondary | ICD-10-CM | POA: Diagnosis not present

## 2016-06-29 DIAGNOSIS — M62838 Other muscle spasm: Secondary | ICD-10-CM | POA: Diagnosis not present

## 2016-06-29 MED ORDER — HYDROCODONE-ACETAMINOPHEN 5-325 MG PO TABS: 1.0000 | ORAL_TABLET | ORAL | 0 refills | Status: DC | PRN

## 2016-06-29 NOTE — Progress Notes (Signed)
Patient is discharged from room 3C08 at this time. Alert and in stable condition. IV site d/c'd and instructions read to patient and family with all questions answered. Ambulate out of units with all belongings at side.

## 2016-06-29 NOTE — Consult Note (Signed)
   [facility] CM Inpatient Consult   06/29/2016  EKANSH SHERK 1950/03/19 [id number]    Came to visit Mr. Kristiansen at bedside on behalf of Link to [facility] Care Management program for [facility] employees/dependents with [facility] insurance. His wife is a Furniture conservator/restorer. Discussed Link to Wellness program. Denies any Link to Wellness needs at this time. Provided Link to [employer], contact information, 24-hr nurse line magnet. Appreciative of visit. Confirmed best contact number as [phone].   Marthenia Rolling, MSN-Ed, RN,BSN [facility] Liaison [phone]

## 2016-06-29 NOTE — Discharge Summary (Signed)
Physician Discharge Summary  Patient ID: Joshua [name] MRN: [id number] DOB/AGE: October 21, 1950 66 y.o.  Admit date: 06/28/2016 Discharge date: 06/29/2016  Admission Diagnoses:  Multilevel cervical spondylosis with radiculopathy, cervical degenerative disease, cervical stenosis, cervical radiculopathy, right upper extremity weakness  Discharge Diagnoses:  Multilevel cervical spondylosis with radiculopathy, cervical degenerative disease, cervical stenosis, cervical radiculopathy, right upper extremity weakness Active Problems:   HNP (herniated nucleus pulposus), cervical   Discharged Condition: good  Hospital Course:  Patient was admitted, underwent a 3 level CIII-4, C4-5, and C5-6 anterior cervical decompression arthrodesis with bone graft and plating. Postoperatively he is done well. His wound is healing nicely. He does have some mild increase in his right deltoid weakness, still able to abduct to about 75. He and his family. Instructed in passive range of motion for the right shoulder. We'll discharge to home. We've given him instructions regarding wound care and activities. He is scheduled to follow-up with me in the office with an x-ray in 3 weeks.  Discharge Exam: Blood pressure 134/81, pulse (!) 56, temperature 98.2 F (36.8 C), resp. rate 16, SpO2 98 %.  Disposition:  Home  Discharge Instructions    Discharge wound care:    Complete by:  As directed    Leave the wound open to air. Shower daily with the wound uncovered. Water and soapy water should run over the incision area. Do not wash directly on the incision for 2 weeks. Remove the glue after 2 weeks.   Driving Restrictions    Complete by:  As directed    No driving for 2 weeks. May ride in the car locally now. May begin to drive locally in 2 weeks.   Other Restrictions    Complete by:  As directed    Walk gradually increasing distances out in the fresh air at least twice a day. Walking additional 6 times inside the house,  gradually increasing distances, daily. No bending, lifting, or twisting. Perform activities between shoulder and waist height (that is at counter height when standing or table height when sitting).     Allergies as of 06/29/2016      Reactions   No Known Allergies       Medication List    TAKE these medications   aspirin 81 MG tablet Take 81 mg by mouth daily.   buPROPion 100 MG 12 hr tablet Commonly known as:  WELLBUTRIN SR Take 100 mg by mouth 2 (two) times daily.   DEPO-TESTOSTERONE 200 MG/ML injection Generic drug:  testosterone cypionate Inject 100 mg into the muscle every 21 ( twenty-one) days.   FAMVIR 125 MG tablet Generic drug:  famciclovir Take 125 mg by mouth 2 (two) times daily as needed.   gabapentin 300 MG capsule Commonly known as:  NEURONTIN Take 300 mg by mouth 2 (two) times daily. To reduce pain   HYDROcodone-acetaminophen 5-325 MG tablet Commonly known as:  NORCO/VICODIN Take 1-2 tablets by mouth every 4 (four) hours as needed (pain).   losartan-hydrochlorothiazide 50-12.5 MG tablet Commonly known as:  HYZAAR Take 1 tablet by mouth daily.   methocarbamol 500 MG tablet Commonly known as:  ROBAXIN Take 1 tablet (500 mg total) by mouth every 8 (eight) hours as needed for muscle spasms.   metoprolol succinate 25 MG 24 hr tablet Commonly known as:  TOPROL-XL Take 25 mg by mouth daily.   MULTIVITAMINS PO Take 1 tablet by mouth daily.   RITALIN SR 20 MG ER tablet Generic drug:  methylphenidate Take 20 mg  by mouth daily as needed.   simvastatin 40 MG tablet Commonly known as:  ZOCOR Take 40 mg by mouth daily.   traMADol 50 MG tablet Commonly known as:  ULTRAM Take 1 tablet (50 mg total) by mouth every 6 (six) hours as needed.        SignedHosie Spangle 06/29/2016, 10:31 AM

## 2016-06-30 ENCOUNTER — Other Ambulatory Visit: Payer: Self-pay | Admitting: *Deleted

## 2016-06-30 NOTE — Patient Outreach (Addendum)
[place] [facility]) Care Management  06/30/2016  Joshua [name] [birth year] [id number]   Subjective: Telephone call to patient's home  number, no answer, left HIPAA compliant voicemail message, and requested call back.   Objective: Per chart review, patient hospitalized 06/28/16 - 06/29/16 for Multilevel cervical spondylosis.   Status post C3-4, C4-5, and C5-6 anterior cervical decompression and arthrodesis with structural allograft and Dynatran anterior cervical plating on 06/28/16.    [facility] Care Management completed preoperative call on 06/27/16.    Assessment: Received UMR Transition of care referral on 06/29/16.   Transition of care follow up pending patient contact.    Plan: RNCM will call patient for 2nd telephone outreach attempt, transition of care follow up, within 10 business days if no return call.    Joshua [name]. Annia Friendly, BSN, [place] Management [facility] Telephonic CM Phone: [phone] Fax: [phone]

## 2016-07-04 ENCOUNTER — Other Ambulatory Visit: Payer: Self-pay | Admitting: *Deleted

## 2016-07-04 ENCOUNTER — Encounter: Payer: Self-pay | Admitting: *Deleted

## 2016-07-04 NOTE — Patient Outreach (Signed)
[facility]) Care Management  07/04/2016  Joshua [name] 15-Sep-1950 [id number]   Subjective: Telephone call to patient's home number, spoke with patient, and HIPAA verified.  Discussed [facility] Care Management UMR Transition of care follow up, patient voiced understanding, and is in agreement to follow up.   Patient states he is doing much better overall, managing limited range of motion, following activity restrictions, still has throat soreness, some difficulty swallowing due to soreness due to intubation tube, he is planning to call surgeon's office today to report soreness/ difficulty swallowing, and will verify if treatment is needed.  States he will also verify that his 3 week follow up appointment has been scheduled with surgeon.  States he and his wife are in the process of filing claims for hospital indemnity supplement insurance.  Patient states he does not have any transition of care, care coordination, disease management, disease monitoring, transportation, community resource, or pharmacy needs at this time. States he is very appreciative of the follow up and is in agreement to receive [facility] Management information.   Objective: Per chart review, patient hospitalized 06/28/16 - 06/29/16 for Multilevel cervical spondylosis.   Status post C3-4, C4-5, and C5-6 anterior cervical decompression and arthrodesis with structural allograft and Dynatran anterior cervical plating on 06/28/16.    [facility] Care Management completed preoperative call on 06/27/16.    Assessment: Received UMR Transition of care referral on 06/29/16. Transition of care follow up completed, no care management needs, and will proceed with case closure.   Plan: RNCM will send patient successful outreach letter, [facility] pamphlet, and magnet. RNCM will send case closure due to follow up completed / no care management needs request to [facility].   Mardelle Pandolfi H. Annia Friendly, BSN, [place]  Management [facility] Telephonic CM Phone: [phone] Fax: [phone]

## 2016-07-21 DIAGNOSIS — M5412 Radiculopathy, cervical region: Secondary | ICD-10-CM | POA: Diagnosis not present

## 2016-07-21 DIAGNOSIS — I10 Essential (primary) hypertension: Secondary | ICD-10-CM | POA: Diagnosis not present

## 2016-07-21 DIAGNOSIS — M4722 Other spondylosis with radiculopathy, cervical region: Secondary | ICD-10-CM | POA: Diagnosis not present

## 2016-07-21 DIAGNOSIS — Z981 Arthrodesis status: Secondary | ICD-10-CM | POA: Diagnosis not present

## 2016-07-21 DIAGNOSIS — M5030 Other cervical disc degeneration, unspecified cervical region: Secondary | ICD-10-CM | POA: Diagnosis not present

## 2016-07-21 DIAGNOSIS — R29898 Other symptoms and signs involving the musculoskeletal system: Secondary | ICD-10-CM | POA: Diagnosis not present

## 2016-07-21 DIAGNOSIS — M5020 Other cervical disc displacement, unspecified cervical region: Secondary | ICD-10-CM | POA: Diagnosis not present

## 2016-09-27 DIAGNOSIS — M79671 Pain in right foot: Secondary | ICD-10-CM | POA: Diagnosis not present

## 2016-09-27 DIAGNOSIS — M25571 Pain in right ankle and joints of right foot: Secondary | ICD-10-CM | POA: Diagnosis not present

## 2016-10-04 DIAGNOSIS — H9193 Unspecified hearing loss, bilateral: Secondary | ICD-10-CM | POA: Diagnosis not present

## 2016-10-04 DIAGNOSIS — H6123 Impacted cerumen, bilateral: Secondary | ICD-10-CM | POA: Diagnosis not present

## 2016-11-03 DIAGNOSIS — M5412 Radiculopathy, cervical region: Secondary | ICD-10-CM | POA: Diagnosis not present

## 2016-11-03 DIAGNOSIS — Z981 Arthrodesis status: Secondary | ICD-10-CM | POA: Diagnosis not present

## 2016-11-03 DIAGNOSIS — R29898 Other symptoms and signs involving the musculoskeletal system: Secondary | ICD-10-CM | POA: Diagnosis not present

## 2016-11-07 DIAGNOSIS — Z125 Encounter for screening for malignant neoplasm of prostate: Secondary | ICD-10-CM | POA: Diagnosis not present

## 2016-11-07 DIAGNOSIS — Z0000 Encounter for general adult medical examination without abnormal findings: Secondary | ICD-10-CM | POA: Diagnosis not present

## 2016-11-15 DIAGNOSIS — M25571 Pain in right ankle and joints of right foot: Secondary | ICD-10-CM | POA: Diagnosis not present

## 2016-11-15 DIAGNOSIS — M19171 Post-traumatic osteoarthritis, right ankle and foot: Secondary | ICD-10-CM | POA: Diagnosis not present

## 2016-11-15 DIAGNOSIS — M4802 Spinal stenosis, cervical region: Secondary | ICD-10-CM | POA: Diagnosis not present

## 2016-11-15 DIAGNOSIS — I10 Essential (primary) hypertension: Secondary | ICD-10-CM | POA: Diagnosis not present

## 2016-11-15 DIAGNOSIS — M19071 Primary osteoarthritis, right ankle and foot: Secondary | ICD-10-CM | POA: Diagnosis not present

## 2016-11-15 DIAGNOSIS — M79671 Pain in right foot: Secondary | ICD-10-CM | POA: Diagnosis not present

## 2016-11-15 DIAGNOSIS — G4733 Obstructive sleep apnea (adult) (pediatric): Secondary | ICD-10-CM | POA: Diagnosis not present

## 2016-11-16 DIAGNOSIS — F900 Attention-deficit hyperactivity disorder, predominantly inattentive type: Secondary | ICD-10-CM | POA: Diagnosis not present

## 2016-11-16 DIAGNOSIS — M25571 Pain in right ankle and joints of right foot: Secondary | ICD-10-CM | POA: Diagnosis not present

## 2016-11-16 DIAGNOSIS — E291 Testicular hypofunction: Secondary | ICD-10-CM | POA: Diagnosis not present

## 2016-11-16 DIAGNOSIS — G4733 Obstructive sleep apnea (adult) (pediatric): Secondary | ICD-10-CM | POA: Diagnosis not present

## 2016-11-16 DIAGNOSIS — E7849 Other hyperlipidemia: Secondary | ICD-10-CM | POA: Diagnosis not present

## 2016-11-16 DIAGNOSIS — M545 Low back pain: Secondary | ICD-10-CM | POA: Diagnosis not present

## 2016-11-16 DIAGNOSIS — Z23 Encounter for immunization: Secondary | ICD-10-CM | POA: Diagnosis not present

## 2016-11-16 DIAGNOSIS — Z0000 Encounter for general adult medical examination without abnormal findings: Secondary | ICD-10-CM | POA: Diagnosis not present

## 2016-11-16 DIAGNOSIS — I10 Essential (primary) hypertension: Secondary | ICD-10-CM | POA: Diagnosis not present

## 2016-11-16 DIAGNOSIS — Z1389 Encounter for screening for other disorder: Secondary | ICD-10-CM | POA: Diagnosis not present

## 2016-11-21 DIAGNOSIS — M19071 Primary osteoarthritis, right ankle and foot: Secondary | ICD-10-CM | POA: Diagnosis not present

## 2016-11-21 DIAGNOSIS — M21171 Varus deformity, not elsewhere classified, right ankle: Secondary | ICD-10-CM | POA: Diagnosis not present

## 2016-11-21 DIAGNOSIS — Z981 Arthrodesis status: Secondary | ICD-10-CM | POA: Diagnosis not present

## 2016-11-22 DIAGNOSIS — Z981 Arthrodesis status: Secondary | ICD-10-CM | POA: Diagnosis not present

## 2016-11-22 DIAGNOSIS — M19071 Primary osteoarthritis, right ankle and foot: Secondary | ICD-10-CM | POA: Diagnosis not present

## 2016-12-01 DIAGNOSIS — Z1212 Encounter for screening for malignant neoplasm of rectum: Secondary | ICD-10-CM | POA: Diagnosis not present

## 2016-12-13 DIAGNOSIS — M19171 Post-traumatic osteoarthritis, right ankle and foot: Secondary | ICD-10-CM | POA: Diagnosis not present

## 2016-12-21 ENCOUNTER — Ambulatory Visit: Payer: 59 | Admitting: Neurology

## 2016-12-21 ENCOUNTER — Encounter: Payer: Self-pay | Admitting: Neurology

## 2016-12-21 ENCOUNTER — Encounter (INDEPENDENT_AMBULATORY_CARE_PROVIDER_SITE_OTHER): Payer: Self-pay

## 2016-12-21 VITALS — BP 139/78 | HR 80 | Ht 67.0 in | Wt 149.0 lb

## 2016-12-21 DIAGNOSIS — G478 Other sleep disorders: Secondary | ICD-10-CM

## 2016-12-21 DIAGNOSIS — R0683 Snoring: Secondary | ICD-10-CM | POA: Diagnosis not present

## 2016-12-21 DIAGNOSIS — M4802 Spinal stenosis, cervical region: Secondary | ICD-10-CM | POA: Diagnosis not present

## 2016-12-21 DIAGNOSIS — G4733 Obstructive sleep apnea (adult) (pediatric): Secondary | ICD-10-CM

## 2016-12-21 DIAGNOSIS — G4710 Hypersomnia, unspecified: Secondary | ICD-10-CM | POA: Diagnosis not present

## 2016-12-21 NOTE — Progress Notes (Signed)
SLEEP MEDICINE CLINIC   Provider:  [name], M D  Primary Care Physician:  Leanna Battles, MD   Referring Provider: Leanna Battles, MD    Chief Complaint  Patient presents with  . New Patient (Initial Visit)    HPI:  Joshua [name] is a 66 y.o. male , seen here as a referral  from Dr. [name] for a sleep evaluation.  Joshua [name] has been diagnosed with obstructive sleep apnea in the year 2015 but initially had no interest in CPAP. The sleep study is now 66 years old, has also additional diagnoses of hypogonadism, just recently had a early November surgery to his right ankle, history of chronic back pain, ADHD, hypertension, hyperlipidemia, anterior neck fusion by Dr. [name].   He has met his deductible for this year.    Sleep habits are as follows: The couple no longer shares the same bedroom-the patient reports that he is snoring but has been bothersome to his spouse his sleep is as much by snoring.  He usually goes to bed by around 11 PM, and he recently noted more visit dreams alertness at night.  He began taking Benadryl which seems to help and he has sustained sleep again.  He has various frequency of nocturia between once at night to 6 times at night- with urge.  He will finally rise after alarm rings at 7.30 AM- he has struggled with getting up, not feeling quite restored. He describes his bedroom is conducive to sleep, being cool, quiet and dark.  He usually falls asleep on his back or changes position during the night several times.  He is only 1 pillow to elevate his head.   Sleep medical history and family sleep history: The patient's father may have obstructive sleep apnea, has been a snorer. There is no childhood history of sleepwalking, enuresis, night terrors. Brother has sleep walking.    Social history:  Married, 2 children 71 and 34 years old, has never smoked, ETOH -   A beer with dinner every night 6.30 - used ritalin in AM>  Caffeine- one coffee a  day, AM- no sodas, no energy drinks. 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   Review of Systems: Out of a complete 14 system review, the patient complains of only the following symptoms, and all other reviewed systems are negative. Snoring, nocturia.   Epworth score 9 , Fatigue severity score 26  , depression score n/ a  I am quoting here from the sleep study dated 13 November 2013, the patient underwent PSG had a normal blood pressure but an excellent sleepiness score was endorsed at 17 points at the time.  AHI was 16.6, RDI was 33.3 indicating that there was most snoring atrial apnea.  REM AHI was only 4.9 supine AHI was 30.9 indicating that the patient should avoid there were some PLM's, the findings were consistent with a mild apnea eventuated by upper airway resistance syndrome. He only had 7 minutes of total desaturation time.   Social History   Socioeconomic History  . Marital status: Married    Spouse name: [name]  . Number of children: 2  . Years of education: College  . Highest education level: Not on file  Social Needs  . Financial resource strain: Not on file  . Food insecurity - worry: Not on file  . Food insecurity - inability: Not on file  . Transportation needs - medical: Not on file  . Transportation needs -  non-medical: Not on file  Occupational History    Employer: [employer]  Tobacco Use  . Smoking status: Never Smoker  . Smokeless tobacco: Never Used  Substance and Sexual Activity  . Alcohol use: Yes    Comment: socially  . Drug use: No  . Sexual activity: Not on file  Other Topics Concern  . Not on file  Social History Narrative   Patient is married [name])   Patient has two children.   Patient drinks two caffeine drinks per day.   Patient is right-handed.   Patient has a college education.    Family History  Problem Relation Age of Onset  . Lung disease Father   . Parkinson's disease Father   . Lymphoma Mother     Past Medical  History:  Diagnosis Date  . Anemia    as a child  . Anxiety   . Arthritis   . Depression    takes Wellbutrin daily  . Hyperlipidemia    takes Simvastatin daily  . Hypertension    takes Metoprolol and Hyzaar daily   . Muscle spasm    takes Robaxin as needed  . Nerve pain    takes Gabapentin as needed  . OSA (obstructive sleep apnea)   . OSA (obstructive sleep apnea) 12/01/2013   16.6 AHI and RDI of over 30. REM AHI over 30.    . 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 Urinary frequency   . Weakness    numbness and tingling in right arm down to fingers    Past Surgical History:  Procedure Laterality Date  . ANTERIOR CERVICAL DECOMP/DISCECTOMY FUSION N/A 06/28/2016   Procedure: ANTERIOR CERVICAL DECOMPRESSION FUSION CERVICAL THREE-FOUR ,CERVICAL FOUR-FIVE,CERVICAL FIVE-SIX;  Surgeon: Jovita Gamma, MD;  Location: [facility];  Service: Neurosurgery;  Laterality: N/A;  . COLONOSCOPY    . lap inguinal hernia repair wiht mesh Bilateral 10/2003  . REFRACTIVE SURGERY  2003  . TONSILLECTOMY    . wisdom teeth extracted      Current Outpatient Medications  Medication Sig Dispense Refill  . aspirin 81 MG tablet Take 81 mg by mouth daily.    Marland Kitchen buPROPion (WELLBUTRIN SR) 100 MG 12 hr tablet Take 100 mg by mouth 2 (two) times daily.    Marland Kitchen DEPO-TESTOSTERONE 200 MG/ML injection Inject 100 mg into the muscle every 21 ( twenty-one) days.   3  . famciclovir (FAMVIR) 125 MG tablet Take 125 mg by mouth 2 (two) times daily as needed.    . gabapentin (NEURONTIN) 300 MG capsule Take 300 mg by mouth 2 (two) times daily. To reduce pain    . losartan-hydrochlorothiazide (HYZAAR) 50-12.5 MG per tablet Take 1 tablet by mouth daily.    . methylphenidate (RITALIN SR) 20 MG ER tablet Take 20 mg by mouth daily as needed.     . metoprolol succinate (TOPROL-XL) 25 MG 24 hr tablet Take 25 mg by mouth daily.    . Multiple Vitamin (MULTIVITAMINS PO) Take 1 tablet by mouth  daily.    . simvastatin (ZOCOR) 40 MG tablet Take 40 mg by mouth daily.     No current facility-administered medications for this visit.     Allergies as of 12/21/2016 - Review Complete 12/21/2016  Allergen Reaction Noted  . No known allergies  06/27/2016    Vitals: BP 139/78   Pulse 80   Ht '5\' 7"'$  (1.702 m)   Wt 149 lb (67.6 kg)  BMI 23.34 kg/m  Last Weight:  Wt Readings from Last 1 Encounters:  12/21/16 149 lb (67.6 kg)   EQA:STMH mass index is 23.34 kg/m.     Last Height:   Ht Readings from Last 1 Encounters:  12/21/16 '5\' 7"'$  (1.702 m)    Physical exam:  General: The patient is awake, alert and appears not in acute distress. The patient is well groomed. Head: Normocephalic, atraumatic. Neck is supple. Mallampati 3  neck circumference: 16. 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 Marland Kitchen Retrognathia is not seen.  He used a dental device- has lowered snoring, but not improved quality of sleep.  Cardiovascular:  Regular rate and rhythm , without  murmurs or carotid bruit, and without distended neck veins. Respiratory: Lungs are clear to auscultation. Skin:  Without evidence of edema, or rash Trunk: BMI is normal . The patient's posture is erect.  Neurologic exam : The patient is awake and alert, oriented to place and time.   Memory subjective  described as intact.  Attention span & concentration ability appears normal.  Speech is fluent,  without dysarthria, dysphonia or aphasia.  Mood and affect are appropriate.  Cranial nerves: Pupils are equal and briskly reactive to light. Extraocular movements  in vertical and horizontal planes intact and without nystagmus. Visual fields by finger perimetry are intact.Hearing to finger rub intact.  Facial sensation intact to fine touch. Facial motor strength is symmetric and tongue and uvula move midline. Shoulder shrug was symmetrical.   Motor exam:  Normal tone, muscle bulk and symmetric strength in all  extremities. Caveat - we did not test for right foot  Sensory:  Fine touch, pinprick and vibration were tested in all extremities. Proprioception tested in the upper extremities was normal. Coordination: Rapid alternating movements in the fingers/hands was normal. Finger-to-nose maneuver  normal without evidence of ataxia, dysmetria or tremor. Gait and station: Patient walks with 2 crutches as assistive device. Deep tendon reflexes: in the  upper and lower extremities are symmetric and intact. Babinski maneuver response is downgoing.   Assessment:  After physical and neurologic examination, review of laboratory studies,  Personal review of imaging studies, reports of other /same  Imaging studies, results of polysomnography and / or neurophysiology testing and pre-existing records as far as provided in visit., my assessment is   1) Joshua [name] sleep study 3 years ago confirmed the presence of mild sleep apnea associated with strong snoring, his respiratory disturbance index RDI is much higher than his AHI, apnea hypopnea index. The patient apnea was not producing hypoxemia, tachybradycardia arrhythmia arousals were unrelated to PLM's.  2) Joshua [name] does not have anatomic red flags for the presence of obstructive sleep apnea, slender, normal neck circumference for gentleman and his Mallampati is not high-grade.  Since we need to repeat a sleep study to document the current level of apnea I will order a home sleep test which is quicker easier to obtain before the calendar year ends, I will order this as an ASAP home sleep test if similar results to last studies results arise but I would like for him to either be ordered and autotitrator or we will go the dental device path.    The patient was advised of the nature of the diagnosed disorder , the treatment options and the  risks for general health and wellness arising from not treating the condition.   I spent more than  45  minutes of face to face  time  with the patient.  Greater than 50% of time was spent in counseling and coordination of care. We have discussed the diagnosis and differential and I answered the patient's questions.    Plan:  Treatment plan and additional workup :  HST ASAP, to be followed by either auto CPAP or by Dental device.    [name], MD 84/72/0721, 82:88 AM  Certified in Neurology by ABPN Certified in [place] by [facility] [address], [place], [place] [address]

## 2017-01-03 ENCOUNTER — Ambulatory Visit (INDEPENDENT_AMBULATORY_CARE_PROVIDER_SITE_OTHER): Payer: 59 | Admitting: Neurology

## 2017-01-03 DIAGNOSIS — M19171 Post-traumatic osteoarthritis, right ankle and foot: Secondary | ICD-10-CM | POA: Diagnosis not present

## 2017-01-03 DIAGNOSIS — G4710 Hypersomnia, unspecified: Secondary | ICD-10-CM

## 2017-01-03 DIAGNOSIS — Z96661 Presence of right artificial ankle joint: Secondary | ICD-10-CM | POA: Diagnosis not present

## 2017-01-03 DIAGNOSIS — G4733 Obstructive sleep apnea (adult) (pediatric): Secondary | ICD-10-CM

## 2017-01-03 DIAGNOSIS — G478 Other sleep disorders: Secondary | ICD-10-CM

## 2017-01-03 DIAGNOSIS — R0683 Snoring: Secondary | ICD-10-CM

## 2017-01-03 DIAGNOSIS — M4802 Spinal stenosis, cervical region: Secondary | ICD-10-CM

## 2017-01-05 ENCOUNTER — Telehealth: Payer: Self-pay | Admitting: Neurology

## 2017-01-05 NOTE — Telephone Encounter (Signed)
Dr Dohmeier has not read this patient's study yet. I will call the patient when the results are ready.

## 2017-01-05 NOTE — Telephone Encounter (Signed)
Pt request results from in home sleep study. He is wanting to make sure it did take. Please call

## 2017-01-08 ENCOUNTER — Other Ambulatory Visit: Payer: Self-pay | Admitting: Neurology

## 2017-01-08 DIAGNOSIS — G4731 Primary central sleep apnea: Secondary | ICD-10-CM

## 2017-01-08 NOTE — Procedures (Signed)
[facility] [address] [place], [place] [address] NAME:  [name]                                                      DOB: 1950-09-11 MEDICAL RECORD NUMBER [id number]                                   DOS: 01/03/2017 REFERRING PHYSICIAN: Leanna Battles, M.D. STUDY PERFORMED: HST on apnea link HISTORY: Joshua [name] is a 66 y.o. male patient who has been diagnosed with sleep apnea in the year 2015, but initially had no interest in CPAP.He carries the diagnoses of hypogonadism, just recently (November) underwent surgery to his right ankle, history of chronic back pain, ADHD, hypertension, hyperlipidemia, anterior neck fusion by Dr. [name]. Epworth Sleepiness score endorsed at 9 points, Fatigue severity score at 26 points, and BMI 23.3   PSG dated 13 November 2013 after Epworth sleepiness score was endorsed at 17 points at the time. AHI was 16.6, RDI was 33.3 indicating that there was mostly snoring and less apnea.  REM AHI was only 4.9/hr., supine AHI was 30.9.   STUDY RESULTS: Total Recording Time: 6 hours 12 minutes Total Apnea/Hypopnea Index (AHI):  24.6 /hour with 63% Central Apneas  Average Oxygen Saturation: 93%; Lowest Oxygen Saturation:  SpO2 65 %  Total Time Oxygen Saturation Below 89%:  24 minutes  Average Heart Rate: 53 bpm, between 40 and 76 bpm.   IMPRESSION: Central sleep apnea, Complex Sleep apnea - moderate severity.  RECOMMENDATION: Central sleep apnea needs to be titrated to PAP therapy in an attended study. Central sleep apnea cannot be auto titrated. I certify that I have reviewed the raw data recording prior to the issuance of this report in accordance with the standards of the American Academy of Sleep Medicine (AASM).   [name], M.D.     01-08-2017   Diplomat of the American Board of Psychiatry and Neurology and the American Board of Sleep Medicine, and accredited by the [place] [employer].

## 2017-01-09 ENCOUNTER — Telehealth: Payer: Self-pay | Admitting: Neurology

## 2017-01-09 NOTE — Progress Notes (Signed)
Yes, we have three openings left

## 2017-01-09 NOTE — Telephone Encounter (Signed)
I called pt. I advised pt that Dr. Brett Fairy reviewed their sleep study results and found that pt has complex sleep apnea mostly central. Dr. Brett Fairy recommends that pt comes in for CPAP titration. I informed the patient on how this process works and explained that the sleep lab would contact him. Pt had no questions at this time but was encouraged to call back if questions arise.

## 2017-01-09 NOTE — Telephone Encounter (Signed)
-----   Message from [name], MD sent at 01/08/2017  5:39 PM EST ----- Surprisingly central dominant apnea was found in this HST - Desaturation index was 26.6, AHI was 24.6 and this constellation would require CPAP therapy- Central apnea is not to be auto- titrated in an unattended procedure. CD

## 2017-01-11 ENCOUNTER — Ambulatory Visit (INDEPENDENT_AMBULATORY_CARE_PROVIDER_SITE_OTHER): Payer: 59 | Admitting: Neurology

## 2017-01-11 DIAGNOSIS — G4731 Primary central sleep apnea: Secondary | ICD-10-CM

## 2017-01-23 ENCOUNTER — Other Ambulatory Visit: Payer: Self-pay | Admitting: Neurology

## 2017-01-23 ENCOUNTER — Telehealth: Payer: Self-pay | Admitting: Neurology

## 2017-01-23 DIAGNOSIS — G4731 Primary central sleep apnea: Secondary | ICD-10-CM

## 2017-01-23 NOTE — Procedures (Signed)
PATIENT'S NAME:  Joshua [name], Joshua [name] DOB:      February 16, 1950      MR#:    [id number]     DATE OF RECORDING: 01/11/2017 REFERRING M.D.:  Leanna Battles, M.D. Study Performed:   Titration to CPAP HISTORY:  Joshua [name] is a 66 y.o. male patient who has been diagnosed with sleep apnea in the year 2015, but initially had no interest in CPAP. He underwent PSG on 13 November 2013 after Epworth sleepiness score was endorsed at 17 points at the time Epworth Sleepiness score now endorsed at 9 points, Fatigue severity score at 26 points, and BMI 23. He underwent HST on 01-03-2017, resulting in a AHI of 24.6/hr. and nadir Spo2 was 65%, 24 minutes total desaturation time. His apnea was central apnea, therefor returning for attended titration with possible change to BiPAP.   CURRENT MEDICATIONS: Aspirin, Wellbutrin, Depo-Testosterone, Famvir, Neurontin, Hyzaar, Ritalin, Toprol, Multivitamins, Zocor   PROCEDURE:  This is a multichannel digital polysomnogram utilizing the SomnoStar 11.2 system.  Electrodes and sensors were applied and monitored per AASM Specifications.   EEG, EOG, Chin and Limb EMG, were sampled at 200 Hz.  ECG, Snore and Nasal Pressure, Thermal Airflow, Respiratory Effort, CPAP Flow and Pressure, Oximetry was sampled at 50 Hz. Digital video and audio were recorded.      CPAP was initiated at 5 cmH20 with heated humidity per AASM split night standards and pressure was advanced to 7cmH20 because of hypopneas, apneas and desaturations.  At a PAP pressure of 7 cmH20, there was a reduction of the AHI to 3.1 with improvement of obstructive sleep apnea, but higher pressures caused central apneas to arise.     Lights Out was at 22:28 and Lights On at 05:01. Total recording time (TRT) was 393.5 minutes, with a total sleep time (TST) of 284 minutes. The patient's sleep latency was 7.5 minutes with 0 minutes of wake time after sleep onset. REM latency was 204 minutes.  The sleep efficiency was 72.2 %.    SLEEP  ARCHITECTURE: WASO (Wake after sleep onset) was 104 minutes.  There were 30 minutes in Stage N1, 187.5 minutes Stage N2, 23.5 minutes Stage N3 and 43 minutes in Stage REM.  The percentage of Stage N1 was 10.6%, Stage N2 was 66.%, Stage N3 was 8.3% and Stage R (REM sleep) was 15.1%.   RESPIRATORY ANALYSIS:  There was a total of 23 respiratory events: 2 obstructive apneas, 21 central apneas and 0 mixed apneas with a total of 23 apneas and an apnea index (AI) of 4.9 /hour. There were 0 hypopneas with a hypopnea index of 0/hour. The patient also had 1 respiratory event related arousal (RERAs).      The total APNEA/HYPOPNEA INDEX  (AHI) was 4.9 /hour and the total RESPIRATORY DISTURBANCE INDEX was 5.1 .hour  1 events occurred in REM sleep and 22 events in NREM. The REM AHI was 1.4 /hour versus a non-REM AHI of 5.5 /hour.  The patient spent 266 minutes of total sleep time in the supine position and 18 minutes in non-supine. The supine AHI was 5.2, versus a non-supine AHI of 0.0.  OXYGEN SATURATION & C02:  The baseline 02 saturation was 99%, with the lowest being 90%. Time spent below 89% saturation equaled 0 minutes.  PERIODIC LIMB MOVEMENTS:   The patient had a total of 0 Periodic Limb Movements. The arousals were noted as: 78 were spontaneous, 0 were associated with PLMs, and 15 were associated with respiratory events. EKG was  in keeping with normal sinus rhythm (NSR).  DIAGNOSIS 1. Central Sleep Apnea, complex apnea. 2. CPAP reduced the AHI under low pressures, but caused central apneas to emerge once pressures higher than 6 cm water were applied.    PLANS/RECOMMENDATIONS: Complex, central dominant sleep apnea: Recommend an auto titration capable CPAP of 7 cm water, 2 cm EPR, fitted with an AirFit P 10 by [employer] in small size. Will follow up at 2 to 3 months to assess response.    A follow up appointment will be scheduled in the Sleep Clinic at [facility].   Please call  [phone] with any questions.      I certify that I have reviewed the entire raw data recording prior to the issuance of this report in accordance with the Standards of Accreditation of the American Academy of Sleep Medicine (AASM)    [name], M.D.  01-23-2017  Diplomat, American Board of Psychiatry and Neurology  Diplomat, [place] of Sleep Medicine Medical Director, [place] [facility]

## 2017-01-23 NOTE — Telephone Encounter (Signed)
I called pt. I advised pt that Dr. Brett Fairy reviewed their sleep study results and found that pt has sleep apnea. Dr. Brett Fairy recommends that pt starts CPAP. I reviewed PAP compliance expectations with the pt. Pt is agreeable to starting a CPAP. I advised pt that an order will be sent to a DME, [facility], and [employer] will call the pt within about one week after they file with the pt's insurance. [facility] will show the pt how to use the machine, fit for masks, and troubleshoot the CPAP if needed. A follow up appt was made for insurance purposes with Cecille Rubin on April 23, 2017 at 8:15 am. Pt verbalized understanding to arrive 15 minutes early and bring their CPAP. A letter with all of this information in it will be mailed to the pt as a reminder. I verified with the pt that the address we have on file is correct. Pt verbalized understanding of results. Pt had no questions at this time but was encouraged to call back if questions arise.

## 2017-01-23 NOTE — Telephone Encounter (Signed)
-----   Message from [name], MD sent at 01/23/2017 10:29 AM EST ----- PLANS/RECOMMENDATIONS: Complex, central dominant sleep apnea:  Recommend an auto titration capable CPAP of 7 cm water, 2 cm EPR,  fitted with an AirFit P 10 by [employer]Med in small size. Will follow up at 2 to 3 months to assess response.

## 2017-02-21 DIAGNOSIS — M19071 Primary osteoarthritis, right ankle and foot: Secondary | ICD-10-CM | POA: Diagnosis not present

## 2017-02-21 DIAGNOSIS — M19171 Post-traumatic osteoarthritis, right ankle and foot: Secondary | ICD-10-CM | POA: Diagnosis not present

## 2017-03-05 DIAGNOSIS — M5030 Other cervical disc degeneration, unspecified cervical region: Secondary | ICD-10-CM | POA: Diagnosis not present

## 2017-03-05 DIAGNOSIS — I10 Essential (primary) hypertension: Secondary | ICD-10-CM | POA: Diagnosis not present

## 2017-03-05 DIAGNOSIS — Z981 Arthrodesis status: Secondary | ICD-10-CM | POA: Diagnosis not present

## 2017-03-05 DIAGNOSIS — R29898 Other symptoms and signs involving the musculoskeletal system: Secondary | ICD-10-CM | POA: Diagnosis not present

## 2017-03-05 DIAGNOSIS — M5412 Radiculopathy, cervical region: Secondary | ICD-10-CM | POA: Diagnosis not present

## 2017-03-05 DIAGNOSIS — M4722 Other spondylosis with radiculopathy, cervical region: Secondary | ICD-10-CM | POA: Diagnosis not present

## 2017-04-05 ENCOUNTER — Telehealth: Payer: Self-pay | Admitting: Neurology

## 2017-04-05 NOTE — Telephone Encounter (Signed)
Received this message in regards to the patient starting CPAP through [employer].   "I have contacted this patient 3 times with no returned phone call. I am voiding the sales order, however the patient can still be setup with CPAP they will just need to call us."

## 2017-04-23 ENCOUNTER — Ambulatory Visit: Payer: Self-pay | Admitting: Nurse Practitioner

## 2017-05-23 DIAGNOSIS — Z96661 Presence of right artificial ankle joint: Secondary | ICD-10-CM | POA: Diagnosis not present

## 2017-05-23 DIAGNOSIS — M19171 Post-traumatic osteoarthritis, right ankle and foot: Secondary | ICD-10-CM | POA: Diagnosis not present

## 2017-06-13 ENCOUNTER — Encounter: Payer: Self-pay | Admitting: Internal Medicine

## 2017-11-26 DIAGNOSIS — M19171 Post-traumatic osteoarthritis, right ankle and foot: Secondary | ICD-10-CM | POA: Diagnosis not present

## 2017-12-04 DIAGNOSIS — R82998 Other abnormal findings in urine: Secondary | ICD-10-CM | POA: Diagnosis not present

## 2017-12-04 DIAGNOSIS — I10 Essential (primary) hypertension: Secondary | ICD-10-CM | POA: Diagnosis not present

## 2017-12-04 DIAGNOSIS — Z125 Encounter for screening for malignant neoplasm of prostate: Secondary | ICD-10-CM | POA: Diagnosis not present

## 2017-12-04 DIAGNOSIS — Z0000 Encounter for general adult medical examination without abnormal findings: Secondary | ICD-10-CM | POA: Diagnosis not present

## 2017-12-11 DIAGNOSIS — G4733 Obstructive sleep apnea (adult) (pediatric): Secondary | ICD-10-CM | POA: Diagnosis not present

## 2017-12-11 DIAGNOSIS — Z0000 Encounter for general adult medical examination without abnormal findings: Secondary | ICD-10-CM | POA: Diagnosis not present

## 2017-12-11 DIAGNOSIS — F900 Attention-deficit hyperactivity disorder, predominantly inattentive type: Secondary | ICD-10-CM | POA: Diagnosis not present

## 2017-12-11 DIAGNOSIS — F0390 Unspecified dementia without behavioral disturbance: Secondary | ICD-10-CM | POA: Diagnosis not present

## 2017-12-11 DIAGNOSIS — I10 Essential (primary) hypertension: Secondary | ICD-10-CM | POA: Diagnosis not present

## 2017-12-11 DIAGNOSIS — E7849 Other hyperlipidemia: Secondary | ICD-10-CM | POA: Diagnosis not present

## 2017-12-11 DIAGNOSIS — Z1389 Encounter for screening for other disorder: Secondary | ICD-10-CM | POA: Diagnosis not present

## 2017-12-11 DIAGNOSIS — I714 Abdominal aortic aneurysm, without rupture: Secondary | ICD-10-CM | POA: Diagnosis not present

## 2017-12-11 DIAGNOSIS — E291 Testicular hypofunction: Secondary | ICD-10-CM | POA: Diagnosis not present

## 2017-12-13 ENCOUNTER — Other Ambulatory Visit: Payer: Self-pay | Admitting: Internal Medicine

## 2017-12-13 DIAGNOSIS — I714 Abdominal aortic aneurysm, without rupture, unspecified: Secondary | ICD-10-CM

## 2017-12-17 ENCOUNTER — Telehealth: Payer: Self-pay | Admitting: Neurology

## 2017-12-17 NOTE — Telephone Encounter (Signed)
Patient had called the sleep lab wandering if needed to schedule an appointment to start  Process over in starting the CPAP machine. Called the patient because it has been within a year the patient can contact the company [employer] and get scheduled and set up without having to have an apt first. Patient would need to schedule that set up ASAP to start since his year mark is coming to end. Once he is set up with CPAP then next step would be scheduling his 31-90 day follow up with our office.   LVM with all this information and advised the pt to call with questions, also gave the patient [employer]'s number for him to reach out to them and get set up right away.

## 2017-12-21 ENCOUNTER — Ambulatory Visit
Admission: RE | Admit: 2017-12-21 | Discharge: 2017-12-21 | Disposition: A | Discharge status: Home / Self Care | Payer: 59 | Source: Ambulatory Visit | Attending: Internal Medicine | Admitting: Internal Medicine

## 2017-12-21 DIAGNOSIS — I77819 Aortic ectasia, unspecified site: Secondary | ICD-10-CM | POA: Diagnosis not present

## 2017-12-21 DIAGNOSIS — I714 Abdominal aortic aneurysm, without rupture, unspecified: Secondary | ICD-10-CM

## 2017-12-21 DIAGNOSIS — Z1212 Encounter for screening for malignant neoplasm of rectum: Secondary | ICD-10-CM | POA: Diagnosis not present

## 2017-12-27 DIAGNOSIS — G4733 Obstructive sleep apnea (adult) (pediatric): Secondary | ICD-10-CM | POA: Diagnosis not present

## 2018-01-16 DIAGNOSIS — Z23 Encounter for immunization: Secondary | ICD-10-CM | POA: Diagnosis not present

## 2018-01-26 DIAGNOSIS — G4733 Obstructive sleep apnea (adult) (pediatric): Secondary | ICD-10-CM | POA: Diagnosis not present

## 2018-02-21 ENCOUNTER — Telehealth: Payer: Self-pay | Admitting: Neurology

## 2018-02-21 NOTE — Telephone Encounter (Signed)
Called the patient cause I was following up and saw he started his CPAP on 12/27/2017. Pt will need to follow up for insurance purposes within 31-90 days of starting the machine. I didn't see an apt made for the patient at this time. There was no answer. LVM instructing the patient of the follow up visit. Patient needs to be seen by 03/29/2018 for insurance purposes. I also reviewed his download and saw the patient is not quite wearing the machine > 4hrs each night. Instructed the patient to reach out to [employer] if there was a reason he was finding he was unable to tolerate the machine for that length of time. Pt must use the machine > 4 hrs also for insurance purposes.   If pt calls back he must be scheduled with MD or NP's Hilton Sinclair or Megan before 03/29/2018.

## 2018-02-26 DIAGNOSIS — G4733 Obstructive sleep apnea (adult) (pediatric): Secondary | ICD-10-CM | POA: Diagnosis not present

## 2018-03-22 DIAGNOSIS — G4733 Obstructive sleep apnea (adult) (pediatric): Secondary | ICD-10-CM | POA: Diagnosis not present

## 2018-03-22 DIAGNOSIS — E291 Testicular hypofunction: Secondary | ICD-10-CM | POA: Diagnosis not present

## 2018-03-22 DIAGNOSIS — Z6824 Body mass index (BMI) 24.0-24.9, adult: Secondary | ICD-10-CM | POA: Diagnosis not present

## 2018-03-22 DIAGNOSIS — I10 Essential (primary) hypertension: Secondary | ICD-10-CM | POA: Diagnosis not present

## 2018-03-25 NOTE — Telephone Encounter (Signed)
I received another referral for the pt to come in to see someone for initial cpap visit. Per nurse Casey's note pt needs to have this appointment scheduled before 03/29/2018. I lvm informing the pt of this information. I offered the pt an appointment on 03/28/18 @10 :45am with Cecille Rubin. If the pt calls back and that appointment works for him please go ahead and schedule him in that appointment slot.

## 2018-03-28 NOTE — Progress Notes (Signed)
[facility]  PATIENT: Joshua [name] DOB: April 24, 1950   REASON FOR VISIT: follow up for OSA here for initial  CPAP compliance HISTORY FROM: Patient    HISTORY OF PRESENT ILLNESS:UPDATE 2/24/2020CM [name], 68 year old male returns for follow-up with history of obstructive sleep apnea here for compliance.  His original sleep study was in December 2018.  He did not begin using his CPAP to November 2019.  Compliance data dated 03/02/2018-03/31/2018 shows compliance greater than 4 hours at 50%, less than 4 hours 27% for total usage days 77%.  Average usage 4 hours 13 minutes.  Set pressure 7 cm.  AHI 3.6 ESS 10.  Patient was advised that he needs to use his CPAP machine longer each day and use it every day.  He returns for reevaluation   11/15/18CDMr. Joshua [name] has been diagnosed with obstructive sleep apnea in the year 2015 but initially had no interest in CPAP. The sleep study is now 68 years old, has also additional diagnoses of hypogonadism, just recently had a early November surgery to his right ankle, history of chronic back pain, ADHD, hypertension, hyperlipidemia, anterior neck fusion by Dr. [name].   He has met his deductible for this year.    Sleep habits are as follows: The couple no longer shares the same bedroom-the patient reports that he is snoring but has been bothersome to his spouse his sleep is as much by snoring.  He usually goes to bed by around 11 PM, and he recently noted more visit dreams alertness at night.  He began taking Benadryl which seems to help and he has sustained sleep again.  He has various frequency of nocturia between once at night to 6 times at night- with urge.  He will finally rise after alarm rings at 7.30 AM- he has struggled with getting up, not feeling quite restored. He describes his bedroom is conducive to sleep, being cool, quiet and dark.  He usually falls asleep on his back or changes position during the night several times.  He  is only 1 pillow to elevate his head  REVIEW OF SYSTEMS: Full 14 system review of systems performed and notable only for those listed, all others are neg:  Constitutional: neg  Cardiovascular: neg Ear/Nose/Throat: neg  Skin: neg Eyes: neg Respiratory: neg Gastroitestinal: neg  Hematology/Lymphatic: neg  Endocrine: neg Musculoskeletal:neg Allergy/Immunology: neg Neurological: neg Psychiatric: neg Sleep : Obstructive sleep apnea with initial CPAP   ALLERGIES: Allergies  Allergen Reactions  . No Known Allergies     HOME MEDICATIONS: Outpatient Medications Prior to Visit  Medication Sig Dispense Refill  . aspirin 81 MG tablet Take 81 mg by mouth daily.    Marland Kitchen buPROPion (WELLBUTRIN SR) 100 MG 12 hr tablet Take 100 mg by mouth 2 (two) times daily.    Marland Kitchen DEPO-TESTOSTERONE 200 MG/ML injection Inject 100 mg into the muscle every 21 ( twenty-one) days.   3  . gabapentin (NEURONTIN) 300 MG capsule Take 300 mg by mouth 2 (two) times daily. To reduce pain    . losartan-hydrochlorothiazide (HYZAAR) 50-12.5 MG per tablet Take 1 tablet by mouth daily.    . methylphenidate (RITALIN SR) 20 MG ER tablet Take 20 mg by mouth daily as needed.     . metoprolol succinate (TOPROL-XL) 25 MG 24 hr tablet Take 25 mg by mouth daily.    . Multiple Vitamin (MULTIVITAMINS PO) Take 1 tablet by mouth daily.    . simvastatin (ZOCOR) 40 MG tablet Take 40 mg  by mouth daily.    . famciclovir (FAMVIR) 125 MG tablet Take 125 mg by mouth 2 (two) times daily as needed.     No facility-administered medications prior to visit.     PAST MEDICAL HISTORY: Past Medical History:  Diagnosis Date  . Anemia    as a child  . Anxiety   . Arthritis   . Depression    takes Wellbutrin daily  . Hyperlipidemia    takes Simvastatin daily  . Hypertension    takes Metoprolol and Hyzaar daily   . Muscle spasm    takes Robaxin as needed  . Nerve pain    takes Gabapentin as needed  . OSA (obstructive sleep apnea)   . OSA  (obstructive sleep apnea) 12/01/2013   16.6 AHI and RDI of over 30. REM AHI over 30.    . 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 Urinary frequency   . Weakness    numbness and tingling in right arm down to fingers    PAST SURGICAL HISTORY: Past Surgical History:  Procedure Laterality Date  . ANTERIOR CERVICAL DECOMP/DISCECTOMY FUSION N/A 06/28/2016   Procedure: ANTERIOR CERVICAL DECOMPRESSION FUSION CERVICAL THREE-FOUR ,CERVICAL FOUR-FIVE,CERVICAL FIVE-SIX;  Surgeon: Jovita Gamma, MD;  Location: [facility];  Service: Neurosurgery;  Laterality: N/A;  . COLONOSCOPY    . lap inguinal hernia repair wiht mesh Bilateral 10/2003  . REFRACTIVE SURGERY  2003  . TONSILLECTOMY    . wisdom teeth extracted      FAMILY HISTORY: Family History  Problem Relation Age of Onset  . Lung disease Father   . Parkinson's disease Father   . Lymphoma Mother     SOCIAL HISTORY: Social History   Socioeconomic History  . Marital status: Married    Spouse name: [name]  . Number of children: 2  . Years of education: College  . Highest education level: Not on file  Occupational History    Employer: [employer]  Social Needs  . Financial resource strain: Not on file  . Food insecurity:    Worry: Not on file    Inability: Not on file  . Transportation needs:    Medical: Not on file    Non-medical: Not on file  Tobacco Use  . Smoking status: Never Smoker  . Smokeless tobacco: Never Used  Substance and Sexual Activity  . Alcohol use: Yes    Comment: socially  . Drug use: No  . Sexual activity: Not on file  Lifestyle  . Physical activity:    Days per week: Not on file    Minutes per session: Not on file  . Stress: Not on file  Relationships  . Social connections:    Talks on phone: Not on file    Gets together: Not on file    Attends religious service: Not on file    Active member of club or organization: Not on file    Attends meetings of  clubs or organizations: Not on file    Relationship status: Not on file  . Intimate partner violence:    Fear of current or ex partner: Not on file    Emotionally abused: Not on file    Physically abused: Not on file    Forced sexual activity: Not on file  Other Topics Concern  . Not on file  Social History Narrative   Patient is married [name])   Patient has two children.   Patient drinks two  caffeine drinks per day.   Patient is right-handed.   Patient has a college education.     PHYSICAL EXAM  Vitals:   04/01/18 1244  BP: 132/79  Pulse: 66  Weight: 153 lb 3.2 oz (69.5 kg)  Height: '5\' 7"'$  (1.702 m)   Body mass index is 23.99 kg/m.  Generalized: Well developed, in no acute distress  Head: normocephalic and atraumatic,. Oropharynx benign mallopatti 4 Neck: Supple, circumference 16 Lungs: Clear Musculoskeletal: No deformity  Skin no rash or edema Neurological examination   Mentation: Alert oriented to time, place, history taking. Attention span and concentration appropriate. Recent and remote memory intact.  Follows all commands speech and language fluent.   Cranial nerve II-XII: Pupils were equal round reactive to light extraocular movements were full, visual field were full on confrontational test. Facial sensation and strength were normal. hearing was intact to finger rubbing bilaterally. Uvula tongue midline. head turning and shoulder shrug were normal and symmetric.Tongue protrusion into cheek strength was normal. Motor: normal bulk and tone, full strength in the BUE, BLE,  Sensory: normal and symmetric to light touch,   Coordination: finger-nose-finger, heel-to-shin bilaterally, no dysmetria Gait and Station: Rising up from seated position without assistance, normal stance,  moderate stride, good arm swing, smooth turning, able to perform tiptoe, and heel walking without difficulty. Tandem gait is steady  DIAGNOSTIC DATA (LABS, IMAGING, TESTING) - I reviewed  patient records, labs, notes, testing and imaging myself where available.  Lab Results  Component Value Date   WBC 7.1 06/22/2016   HGB 15.7 06/22/2016   HCT 46.9 06/22/2016   MCV 89.3 06/22/2016   PLT 253 06/22/2016      Component Value Date/Time   NA 140 06/22/2016 0852   NA 142 10/03/2013 1111   K 4.2 06/22/2016 0852   CL 105 06/22/2016 0852   CO2 27 06/22/2016 0852   GLUCOSE 73 06/22/2016 0852   BUN 24 (H) 06/22/2016 0852   BUN 23 10/03/2013 1111   CREATININE 1.14 06/22/2016 0852   CALCIUM 9.1 06/22/2016 0852   PROT 6.5 10/03/2013 1111   ALBUMIN 4.1 10/03/2013 1111   AST 27 10/03/2013 1111   ALT 25 10/03/2013 1111   ALKPHOS 78 10/03/2013 1111   BILITOT 0.8 10/03/2013 1111   GFRNONAA >60 06/22/2016 0852   GFRAA >60 06/22/2016 0852    ASSESSMENT AND PLAN  68 y.o. year old male  has a past medical history of Anemia, Anxiety, Arthritis, Depression, Hyperlipidemia, Hypertension, Muscle spasm, Nerve pain, OSA (obstructive sleep apnea), OSA (obstructive sleep apnea) (12/01/2013), Pars defect, Urinary frequency, and Weakness. here to follow-up for obstructive sleep apnea here for initial CPAP compliance.  CPAP compliance 50% greater than 4 hours Please use the machine longer each day and use it every day No change in settings Follow-up in 4 months Dennie Bible, [facility], [facility], [facility] [address], [place], [place] [address] [phone]

## 2018-03-29 DIAGNOSIS — G4733 Obstructive sleep apnea (adult) (pediatric): Secondary | ICD-10-CM | POA: Diagnosis not present

## 2018-03-31 ENCOUNTER — Encounter: Payer: Self-pay | Admitting: Nurse Practitioner

## 2018-04-01 ENCOUNTER — Ambulatory Visit (INDEPENDENT_AMBULATORY_CARE_PROVIDER_SITE_OTHER): Payer: 59 | Admitting: Nurse Practitioner

## 2018-04-01 ENCOUNTER — Encounter: Payer: Self-pay | Admitting: Nurse Practitioner

## 2018-04-01 DIAGNOSIS — Z9989 Dependence on other enabling machines and devices: Secondary | ICD-10-CM

## 2018-04-01 DIAGNOSIS — G4731 Primary central sleep apnea: Secondary | ICD-10-CM | POA: Insufficient documentation

## 2018-04-01 DIAGNOSIS — G4733 Obstructive sleep apnea (adult) (pediatric): Secondary | ICD-10-CM | POA: Diagnosis not present

## 2018-04-01 NOTE — Patient Instructions (Signed)
CPAP compliance 50% greater than 4 hours Please use the machine longer each day and use it every day No change in settings Follow-up in 4 months

## 2018-04-09 ENCOUNTER — Ambulatory Visit: Payer: 59 | Admitting: Neurology

## 2018-04-16 DIAGNOSIS — Z23 Encounter for immunization: Secondary | ICD-10-CM | POA: Diagnosis not present

## 2018-05-02 ENCOUNTER — Telehealth: Payer: Self-pay | Admitting: Neurology

## 2018-05-02 NOTE — Telephone Encounter (Signed)
Due to current COVID 19 pandemic, our office is severely reducing in office visits for at least the next 2 weeks, in order to minimize the risk to our patients and healthcare providers. Our staff will contact you for next steps.  Pt understands that although there may be some limitations with this type of visit, we will take all precautions to reduce any security or privacy concerns.  Pt understands that this will be treated like an in office visit and we will file with pt's insurance, and there may be a patient responsible charge related to this service.

## 2018-05-05 ENCOUNTER — Encounter: Payer: Self-pay | Admitting: Neurology

## 2018-05-07 ENCOUNTER — Other Ambulatory Visit: Payer: Self-pay

## 2018-05-07 ENCOUNTER — Ambulatory Visit (INDEPENDENT_AMBULATORY_CARE_PROVIDER_SITE_OTHER): Payer: 59 | Admitting: Neurology

## 2018-05-07 DIAGNOSIS — Z9114 Patient's other noncompliance with medication regimen: Secondary | ICD-10-CM | POA: Diagnosis not present

## 2018-05-07 DIAGNOSIS — G4733 Obstructive sleep apnea (adult) (pediatric): Secondary | ICD-10-CM | POA: Diagnosis not present

## 2018-05-07 DIAGNOSIS — I10 Essential (primary) hypertension: Secondary | ICD-10-CM | POA: Diagnosis not present

## 2018-05-07 DIAGNOSIS — G4731 Primary central sleep apnea: Secondary | ICD-10-CM | POA: Diagnosis not present

## 2018-05-07 DIAGNOSIS — Z9989 Dependence on other enabling machines and devices: Secondary | ICD-10-CM | POA: Diagnosis not present

## 2018-05-07 DIAGNOSIS — I714 Abdominal aortic aneurysm, without rupture, unspecified: Secondary | ICD-10-CM

## 2018-05-07 NOTE — Progress Notes (Signed)
SLEEP MEDICINE CLINIC    Virtual Visit via Video/Telephone Note  I connected with [name] on 05/07/18 at  1:00 PM EDT by telephone and verified that I am speaking with the correct person using two identifiers.   I discussed the limitations, risks, security and privacy concerns of performing an evaluation and management service by telephone and the availability of in person appointments. I also discussed with the patient that there may be a patient responsible charge related to this service. The patient expressed understanding and agreed to proceed.  [name], MD   Provider:  [name], MD  Primary Care Physician:  Leanna Battles, MD   Original Referring Provider: Leanna Battles, MD /   Cecille Rubin, NP- [facility] last visit 04-05-2018    HPI:  Joshua [name] is a 68 y.o. male patient, he is an established sleep patient.  My personally last visit with the patient was on 21 December 2016.  He was seen here for sleep evaluation had been prior to that date diagnosed with obstructive sleep apnea in 2015 but initially did not want to use CPAP.  When he saw me his sleep study was 68 years old and he had also acquired additional diagnoses such as hypogonadism, he had ankle surgery and chronic back pain and used pain medication, carries a diagnosis of ADHD, hypertension, hyperlipidemia and for status post anterior neck fusion performed by Dr. Salem Senate.  As he had met his deductible in the year 2018 he wanted to have his sleep study rather ASAP.  The sleep study had resulted in complex central dominant sleep apnea the AHI was 16.6/h the RDI was 33.3/h, supine sleep position exacerbated the AHI to 30.9 by REM sleep related AHI was actually very low at only 4.9/h.  He will be titrated an attended sleep study given the central nature of his apnea.   CPAP was able to reduce the AHI finally but caused some central apneas to emerge at higher pressures.  He seemed to tolerate 6 cm  water pressure the very best and I wrote a prescription for an autotitrator on 23 January 2017 in order to be delivered during his insurance covered calendar year.  The patient's CPAP compliance data were provided by his DME [facility],  showing that by 05 May 2018 he had used the machine 73% of all days and 53% of the time over 4 hours.   The machine is relatively new as I indicated.  The average user time in all days was 3 hours 60 minutes, CPAP was set at 7 cmH2O pressure was 2 cm EPR the residual AHI was only 2.3 with 0.7 central apneas per hour and 0.4 obstructive apneas per hour.  Based on this the settings do not have to change.  I asked the patient by compliance also difficult for him to achieve and he states that he is using a dream wear mask which does not allow him to read as he cannot use his reading glasses.  I stated that we could use other interfaces but he needs to increase his compliance and show more discipline he does have a lot of air leakage but air leakage had improved after 18 March when apparently he tried a different mask set up.  Mr. 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  There is a question if this is a manifestation of adult ADD or ADHD and he states that Ritalin seems to help.  His BMI is 23.3, his blood pressures in the high normal range, he is not on chronic pain at night.     I suggested that she should follow-up with a face-to-face revisit with either me or nurse practitioner here in the office in 3 months my goal is for him to have a [place] cognitive assessment test.  I would also be happy to refer him for ADD or ADHD testing.  I urged him to improve his compliance.  I also warned him that low compliance may lead his insurance to drug coverage for future supplies.      Review of Systems: Out of a  complete 14 system review, the patient complains of only the following symptoms, and all other reviewed systems are negative.   Not snoring on CPAP.  g forgets his CPAP - I read and fall asleep without having the mask in place.  No flowsheet data found.  How likely are you to doze in the following situations: 0 = not likely, 1 = slight chance, 2 = moderate chance, 3 = high chance  Sitting and Reading? Watching Television? Sitting inactive in a public place (theater or meeting)? Lying down in the afternoon when circumstances permit? Sitting and talking to someone? Sitting quietly after lunch without alcohol? In a car, while stopped for a few minutes in traffic? As a passenger in a car for an hour without a break?  Total = 9/24 points.     Social History   Socioeconomic History  . Marital status: Married    Spouse name: Joshua [name]  . Number of children: 2  . Years of education: College  . Highest education level: Not on file  Occupational History    Employer: [employer]  Social Needs  . Financial resource strain: Not on file  . Food insecurity:    Worry: Not on file    Inability: Not on file  . Transportation needs:    Medical: Not on file    Non-medical: Not on file  Tobacco Use  . Smoking status: Never Smoker  . Smokeless tobacco: Never Used  Substance and Sexual Activity  . Alcohol use: Yes    Comment: socially  . Drug use: No  . Sexual activity: Not on file  Lifestyle  . Physical activity:    Days per week: Not on file    Minutes per session: Not on file  . Stress: Not on file  Relationships  . Social connections:    Talks on phone: Not on file    Gets together: Not on file    Attends religious service: Not on file    Active member of club or organization: Not on file    Attends meetings of clubs or organizations: Not on file    Relationship status: Not on file  . Intimate partner violence:    Fear of current or ex partner: Not on file    Emotionally  abused: Not on file    Physically abused: Not on file    Forced sexual activity: Not on file  Other Topics Concern  . Not on file  Social History Narrative   Patient is married Joshua [name])   Patient has two children.   Patient drinks two caffeine drinks per day.   Patient is right-handed.   Patient has a college education.    Family History  Problem Relation Age of Onset  . Lung disease Father   . Parkinson's disease Father   . Lymphoma Mother     Past Medical History:  Diagnosis Date  . Anemia    as a child  . Anxiety   . Arthritis   . Depression    takes Wellbutrin daily  . Hyperlipidemia    takes Simvastatin daily  . Hypertension    takes Metoprolol and Hyzaar daily   . Muscle spasm    takes Robaxin as needed  . Nerve pain    takes Gabapentin as needed  . OSA (obstructive sleep apnea)   . OSA (obstructive sleep apnea) 12/01/2013   16.6 AHI and RDI of over 30. REM AHI over 30.    . 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 Urinary frequency   . Weakness    numbness and tingling in right arm down to fingers    Past Surgical History:  Procedure Laterality Date  . ANTERIOR CERVICAL DECOMP/DISCECTOMY FUSION N/A 06/28/2016   Procedure: ANTERIOR CERVICAL DECOMPRESSION FUSION CERVICAL THREE-FOUR ,CERVICAL FOUR-FIVE,CERVICAL FIVE-SIX;  Surgeon: Jovita Gamma, MD;  Location: [facility];  Service: Neurosurgery;  Laterality: N/A;  . COLONOSCOPY    . lap inguinal hernia repair wiht mesh Bilateral 10/2003  . REFRACTIVE SURGERY  2003  . TONSILLECTOMY    . wisdom teeth extracted      Current Outpatient Medications  Medication Sig Dispense Refill  . aspirin 81 MG tablet Take 81 mg by mouth daily.    Marland Kitchen buPROPion (WELLBUTRIN SR) 100 MG 12 hr tablet Take 100 mg by mouth 2 (two) times daily.    Marland Kitchen DEPO-TESTOSTERONE 200 MG/ML injection Inject 100 mg into the muscle every 21 ( twenty-one) days.   3  . famciclovir (FAMVIR) 125 MG tablet Take  125 mg by mouth 2 (two) times daily as needed.    . gabapentin (NEURONTIN) 300 MG capsule Take 300 mg by mouth 2 (two) times daily. To reduce pain    . losartan-hydrochlorothiazide (HYZAAR) 50-12.5 MG per tablet Take 1 tablet by mouth daily.    . methylphenidate (RITALIN SR) 20 MG ER tablet Take 20 mg by mouth daily as needed.     . metoprolol succinate (TOPROL-XL) 25 MG 24 hr tablet Take 25 mg by mouth daily.    . Multiple Vitamin (MULTIVITAMINS PO) Take 1 tablet by mouth daily.    . simvastatin (ZOCOR) 40 MG tablet Take 40 mg by mouth daily.     No current facility-administered medications for this visit.     Allergies as of 05/07/2018 - Review Complete 04/01/2018  Allergen Reaction Noted  . No known allergies  06/27/2016    Vitals: There were no vitals taken for this visit. Last Weight:  Wt Readings from Last 1 Encounters:  04/01/18 153 lb 3.2 oz (69.5 kg)   DJT:TSVXB is no height or weight on file to calculate BMI.     Last Height:   Ht Readings from Last 1 Encounters:  04/01/18 '5\' 7"'$  (1.702 m)    Physical exam/ Observation :  General: The patient is awake, alert and appears not in acute distress. The patient is well groomed. Logorrhea noted, tangential discussion of the issues at hand.   Head: Normocephalic, atraumatic. \  Mallampati; 3 ,  neck circumference: 16"  Nasal airflow patent ,  TMJ is click  evident .  Retrognathia is not seen.  Trunk: BMI is 22.3  The patient's posture is erect.   Neurologic exam per virtual visit.: The patient is awake and alert, oriented to place and time.    Attention span & concentration ability appears limited.  Speech is fluent, without  dysarthria, dysphonia or aphasia.  Mood and affect are appropriate.  Cranial nerves: pupils equal size, facial symmetry preserved, normal shoulder shrug,  ROM and bulk.  Bud Face: He needs to use his CPAP for at least 4 hours each night .  The CPAP setting works well by reduction of AHI.   Fatigue may go hand in hand,  His depression and ADD/ ADHD may correlate as well.    I discussed the assessment and treatment plan with the patient. The patient was provided an opportunity to ask questions and all were answered. The patient agreed with the plan and demonstrated an understanding of the instructions.   The patient was advised to call back or seek an in-person evaluation if the symptoms worsen or if the condition fails to improve as anticipated.  I provided  16 minutes of non-face-to-face time during this encounter.   [facility] face to face visit with NP. 3-4 month from now.    Depression testing, .[id number] with next face to face visit.  If non compliance persists , no follow up in sleep clinic needed.    [name], MD 4/49/2010, 07:12 PM  Certified in Neurology by [employer]BP[employer] Certified in [place] by [facility] [address] [place], [place] [address]

## 2018-05-20 ENCOUNTER — Encounter: Payer: Self-pay | Admitting: Neurology

## 2018-05-20 DIAGNOSIS — Z91199 Patient's noncompliance with other medical treatment and regimen due to unspecified reason: Secondary | ICD-10-CM | POA: Insufficient documentation

## 2018-05-20 DIAGNOSIS — Z9114 Patient's other noncompliance with medication regimen: Secondary | ICD-10-CM | POA: Insufficient documentation

## 2018-05-20 DIAGNOSIS — I10 Essential (primary) hypertension: Secondary | ICD-10-CM | POA: Insufficient documentation

## 2018-05-20 DIAGNOSIS — I714 Abdominal aortic aneurysm, without rupture, unspecified: Secondary | ICD-10-CM | POA: Insufficient documentation

## 2018-05-20 NOTE — Patient Instructions (Signed)
Attention Deficit Hyperactivity Disorder, Adult Attention deficit hyperactivity disorder (ADHD) is a mental health disorder that starts during childhood. For many people with ADHD, the disorder continues into adult years. There are many things that you and your health care provider or therapist (mental health professional) can do to manage your symptoms. What are the causes? The exact cause of ADHD is not known. What increases the risk? You are more likely to develop this condition if:  You have a family history of ADHD.  You are male.  You were born to a mother who smoked or drank alcohol during pregnancy.  You were exposed to lead poisoning or other toxins in the womb or in early life.  You were born before 37 weeks of pregnancy (prematurely) or you had a low birth weight.  You have experienced a brain injury. What are the signs or symptoms? Symptoms of this condition depend on the type of ADHD. The two main types are inattentive and hyperactive-impulsive. Some people may have symptoms of both types. Symptoms of the inattentive type include:  Difficulty watching, listening, or thinking with focused effort (paying attention).  Making careless mistakes.  Not listening.  Not following instructions.  Being disorganized.  Avoiding tasks that require time and attention.  Losing things.  Forgetting things.  Being easily distracted. Symptoms of the hyperactive-impulsive type include:  Restlessness.  Talking too much.  Interrupting.  Difficulty with: ? Sitting still. ? Staying quiet. ? Feeling motivated. ? Relaxing. ? Waiting in line or waiting for a turn. How is this diagnosed? This condition is diagnosed based on your current symptoms and your history of symptoms. The diagnosis can be made by a provider such as a primary care provider, psychiatrist, psychologist, or clinical social worker. The provider may use a symptom checklist or a standardized behavior rating  scale to evaluate your symptoms. He or she may want to talk with family members who have known you for a long time and have observed your behaviors. There are no lab tests or brain imaging tests that can diagnose ADHD. How is this treated? This condition can be treated with medicines and behavior therapy. Medicines may be the best option to reduce impulsive behaviors and improve attention. Your health care provider may recommend:  Stimulant medicines. These are the most common medicines used for adult ADHD. They affect certain chemicals in the brain (neurotransmitters). These medicines may be long-acting or short-acting. This will determine how often you need to take the medicine.  A non-stimulant medicine for adult ADHD (atomoxetine). This medicine increases a neurotransmitter called norepinephrine. It may take weeks to months to see effects from this medicine. Psychotherapy and behavioral management are also important for treating ADHD. Psychotherapy is often used along with medicine. Your health care provider may suggest:  Cognitive behavioral therapy (CBT). This type of therapy teaches you to replace negative thoughts and actions with positive thoughts and actions. When used as part of ADHD treatment, this therapy may also include: ? Coping strategies for organization, time management, impulse control, and stress reduction. ? Mindfulness and meditation training.  Behavioral management. This may include strategies for organization and time management. You may work with an ADHD coach who is specially trained to help people with ADHD to manage and organize activities and to function more effectively. Follow these instructions at home: Medicines   Take over-the-counter and prescription medicines only as told by your health care provider.  Talk with your health care provider about the possible side effects of your   medicine to watch for. General instructions   Learn as much as you can about  adult ADHD, and work closely with your health care providers to find the treatments that work best for you.  Do not use drugs or abuse alcohol. Limit alcohol intake to no more than 1 drink a day for nonpregnant women and 2 drinks a day for men. One drink equals 12 oz of beer, 5 oz of wine, or 1 oz of hard liquor.  Follow the same schedule each day. Make sure your schedule includes enough time for you to get plenty of sleep.  Use reminder devices like notes, calendars, and phone apps to stay on-time and organized.  Eat a healthy diet. Do not skip meals.  Exercise regularly. Exercise can help to reduce stress and anxiety.  Keep all follow-up visits as told by your health care provider and therapist. This is important. Where to find more information  A health care provider may be able to recommend resources that are available online or over the phone. You could start with: ? Attention Deficit Disorder Association (ADDA): PubAddiction.co.nz ? [place] [facility]): https://carter.com/ Contact a health care provider if:  Your symptoms are changing, getting worse, or not improving.  You have side effects from your medicine, such as: ? Repeated muscle twitches, coughing, or speech outbursts. ? Sleep problems. ? Loss of appetite. ? Depression. ? New or worsening behavior problems. ? Dizziness. ? Unusually fast heartbeat. ? Stomach pains. ? Headaches.  You are struggling with anxiety, depression, or substance abuse. Get help right away if:  You have a severe reaction to a medicine.  You have thoughts of hurting yourself or others. If you ever feel like you may hurt yourself or others, or have thoughts about taking your own life, get help right away. You can go to the nearest emergency department or call:  Your local emergency services (911 in the [place]  A suicide crisis helpline, such as the [place] at (502)015-9217. This is open 24 hours a  day. Summary  ADHD is a mental health disorder that starts during childhood and often continues into adult years.  The exact cause of ADHD is not known.  There is no cure for ADHD, but treatment with medicine, therapy, or behavioral training can help you manage your condition. This information is not intended to replace advice given to you by your health care provider. Make sure you discuss any questions you have with your health care provider. Document Released: 09/14/2016 Document Revised: 09/14/2016 Document Reviewed: 09/14/2016 Elsevier Interactive Patient Education  2019 [place] de dficit de atencin e hiperactividad, en adultos Attention Deficit Hyperactivity Disorder, Adult El trastorno de dficit de atencin e hiperactividad ([place]) es un trastorno de salud mental que comienza en la niez. Para muchas personas con TDAH, este trastorno [employer]. Hay muchas cosas que usted y el mdico o terapeuta (profesional de la salud mental) pueden hacer para Air cabin crew los sntomas. Cules son las causas? Se desconoce la causa exacta del TDAH. Qu incrementa el riesgo? Es ms probable que usted sufra esta afeccin si:  Tiene antecedentes familiares de TDAH.  Es hombre.  Su madre fum o bebi alcohol durante el embarazo.  Estuvo expuesto a envenenamiento por plomo u otras toxinas en el tero o en los primeros aos de vida.  Naci antes de las 37 semanas de embarazo (de forma prematura) o tuvo un peso bajo al nacer.  Sufri una lesin  cerebral. Cules son los signos o los sntomas? Los sntomas de esta afeccin dependen del tipo de [place]. Los dos tipos principales son falta de atencin e hiperactividad e impulsividad. Algunas personas pueden tener sntomas de ambos tipos. Los sntomas del tipo de falta de atencin incluyen:  Dificultad para observar, escuchar o pensar con esfuerzo concentrado (prestando atencin).  Cometer errores por descuido.  No  escuchar.  No seguir instrucciones.  Ser desorganizado.  Evitar tareas que requieren tiempo y atencin.  Perder cosas.  Olvidar cosas.  Distraerse fcilmente. Los sntomas del tipo hiperactividad e impulsividad incluyen:  Agitacin.  Hablar demasiado.  Interrumpir.  Dificultad para hacer lo siguiente: ? Estar quieto. ? Estar callado. ? Sentirse motivado. ? Relajarse. ? Esperar en una fila o esperar un turno. Cmo se diagnostica? Esta afeccin puede diagnosticarse en funcin de los sntomas actuales y los antecedentes de sntomas. El diagnstico puede hacerlo un profesional como un mdico de atencin primaria, Dance movement psychotherapist, Psychologist, sport and exercise o Brewing technologist social clnico. [place] profesional puede usar una lista de sntomas o una escala de clasificacin de la conducta estandarizada para evaluar los sntomas. Es posible que este desee hablar con parientes que lo conocen desde hace mucho tiempo y han observado sus [place]. No hay pruebas de laboratorio ni pruebas de diagnstico por imgenes del cerebro que puedan diagnosticar el TDAH. Cmo se trata? Esta afeccin puede tratarse con medicamentos y Soil scientist. Los medicamentos pueden ser la mejor opcin para reducir las conductas impulsivas y Engineer, manufacturing systems atencin. El mdico podr indicar lo siguiente:  Medicamentos estimulantes. Estos son los medicamentos ms comunes usados para los adultos con [place]. Afectan ciertas sustancias qumicas del cerebro (neurotransmisores). Estos medicamentos pueden ser de accin prolongada o de accin corta. Esto determinar la frecuencia con la que deber tomar el medicamento.  Un medicamento no estimulante para adultos con TDAH (atomoxetina). Este medicamento aumenta un neurotransmisor denominado noradrenalina. Puede tardar [employer]  a meses en ver los efectos de [employer]. La psicoterapia y el manejo de la conducta son importantes tambin en el tratamiento del TDAH. La psicoterapia se [place] frecuentemente  junto con un medicamento. El mdico puede recomendarle lo siguiente:  Terapia cognitivo conductual (TCC). Este tipo de terapia le ensea a Training and development officer pensamientos y acciones negativas por pensamientos y acciones positivas. Cuando se [place] como parte del tratamiento para [place], esta terapia puede tambin incluir: ? Librarian, academic de [place] para la organizacin, manejo del [place], control de impulsos y reduccin del Psychologist, forensic. ? Capacitacin para meditacin tipo mindfulness y tradicional.  Manejo de la conducta. Esto puede incluir estrategias para la organizacin y para el manejo del [place]. Puede trabajar junto con un asesor en [employer] quien est especialmente capacitado para ayudar a las personas que tienen TDAH a Air cabin crew y [place] y funcionar de modo ms efectivo. Siga estas indicaciones en su casa: Medicamentos   [employer] de venta libre y los recetados solamente como se lo haya indicado el mdico.  Hable con el mdico acerca de los posibles efectos secundarios de estos medicamentos que debe tener en cuenta. Instrucciones generales   Obtenga la mayor cantidad posible de informacin sobre el TDAH y trabaje en estrecha colaboracin con los mdicos para hallar los tratamientos que funcionen mejor en su caso.  No consuma drogas ni alcohol en forma excesiva. Limite el consumo de alcohol a no ms de 23medida por da si es mujer y no est [place], y a 28medidas por da si es hombre. Una medida equivale a 12oz (332ml) de cerveza,  5oz (145ml) de vino o 1oz (48ml) de bebidas alcohlicas de alta graduacin.  Siga el mismo cronograma [employer]. Realice un cronograma que incluya tiempo suficiente para asegurarse de Designer, television/film set.  Use dispositivos para recordar, como notas, calendarios y aplicaciones del telfono celular a fin de ser puntual y [place].  Siga una dieta saludable. No se saltee comidas.  Haga ejercicio regularmente. El ejercicio puede ayudar a  reducir el estrs y la ansiedad.  Concurra a todas las visitas de seguimiento como se lo hayan indicado el mdico y Social worker. Esto es importante. Dnde encontrar ms informacin  El mdico quizs pueda recomendarle recursos disponibles en lnea o por telfono. Podra comenzar con los siguientes: ? Asociacin de Trastornos por Dficit de [place] (Attention Deficit Disorder Association, ADDA): PubAddiction.co.nz ? [place] ([place], [place]): https://carter.com/ Comunquese con un mdico si:  Los sntomas [place], no mejoran o [place].  Los [employer] causan efectos secundarios, por ejemplo: ? Espasmos musculares, tos o arrebatos verbales en repetidas ocasiones. ? Problemas para dormir. ? Prdida del apetito. ? Depresin. ? Problemas de conducta nuevos o que empeoran. ? Mareos. ? Latidos cardacos inusualmente rpidos. ? Dolores de [employer]. ? Dolores de [place].  Est lidiando con ansiedad, depresin o abuso de drogas. Solicite ayuda de inmediato si:  Sufre alguna reaccin grave a los medicamentos.  Tiene pensamientos acerca de Glass blower/designer o daar a Producer, television/film/video. Si alguna vez siente que puede lastimarse a usted mismo o a Producer, television/film/video, o tiene pensamientos de poner fin a su vida, busque ayuda de inmediato. Puede dirigirse al departamento de emergencias ms cercano o llamar a:  El servicio de emergencias de su localidad (911 en EE.UU.).  Una lnea de asistencia al suicida y Freight forwarder en crisis, como la [employer] de Prevencin del Suicidio (National Suicide Prevention Lifeline), al [phone]. Est disponible las 24 horas del da. [place] es un trastorno de salud mental que comienza durante la niez y frecuentemente contina en la [employer] [employer].  Se desconoce la causa exacta del TDAH.  No hay cura para el TDAH, pero el tratamiento con [place], terapia y capacitacin conductual puede ayudarlo a Radiographer, therapeutic. Esta informacin no tiene Marine scientist el consejo del mdico. Asegrese de hacerle al mdico cualquier pregunta que tenga. Document Released: 11/16/2016 Document Revised: 11/16/2016 Document Reviewed: 11/16/2016 Elsevier Interactive Patient Education  2019 [employer].

## 2018-05-26 IMAGING — MR MR CERVICAL SPINE W/O CM
4 of 6 series · 20 of 48 positions shown · non-contrast
Comparison: Cervical spine radiographs 04/13/2016

ADDENDUM:
Study discussed by telephone with with Dr. Melate Sanowar on 04/28/2016
at 4521 hours.
CLINICAL DATA: 66-year-old male with cervical neck and right
shoulder injury while working out at the gym. Progressive pain and
weakness for 2 weeks including in the right arm and right shoulder.
Deltoid weakness.

EXAM:
MRI CERVICAL SPINE WITHOUT CONTRAST
TECHNIQUE: Multiplanar, multisequence MR imaging of the cervical spine was
performed. No intravenous contrast was administered.

[Series 3: T2 post-contrast · sagittal · 3.5mm · 0.35mm/px · 6 of 13 slices shown]
[im 1/13]
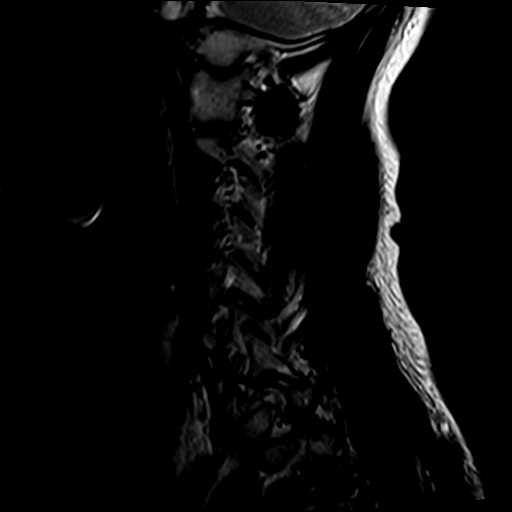
[im 3/13]
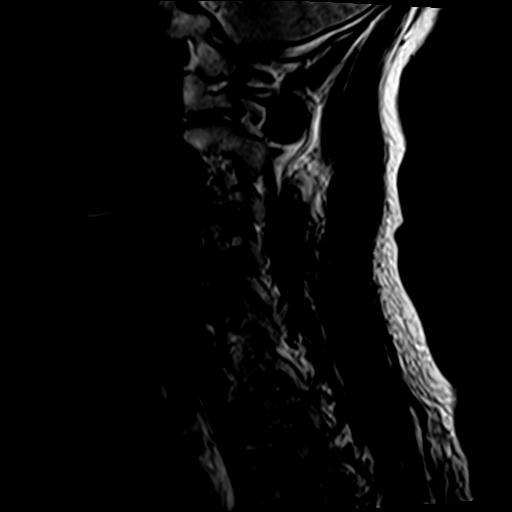
[im 5/13]
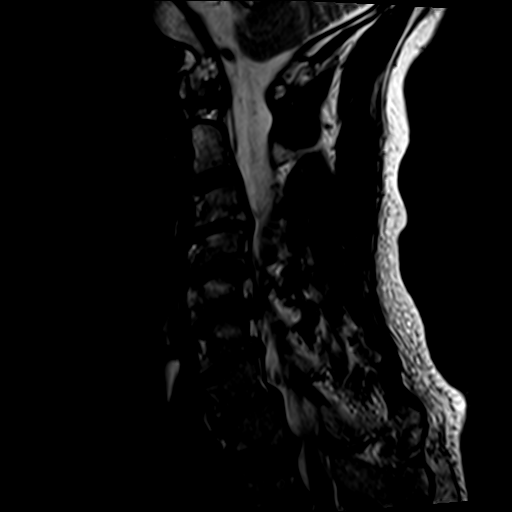
[im 8/13]
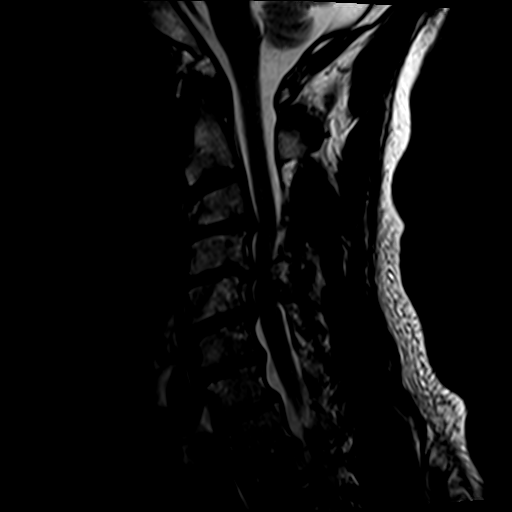
[im 10/13]
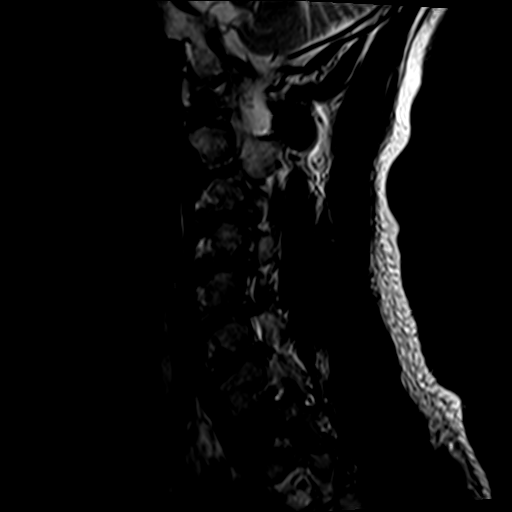
[im 13/13]
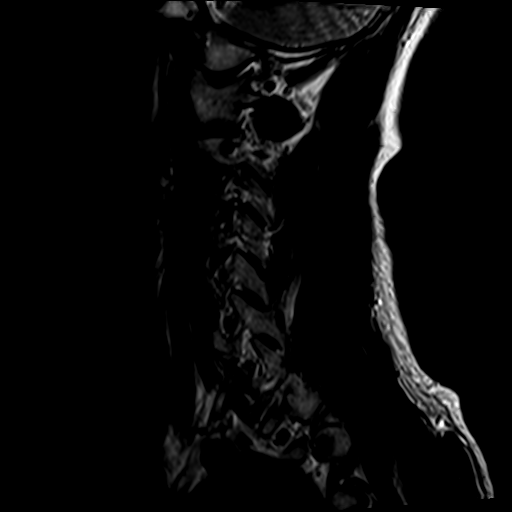

[Series 4: T1 · sagittal · 3.5mm · 0.35mm/px · 3 of 13 slices shown (1 of 2)]
[im 1/13]
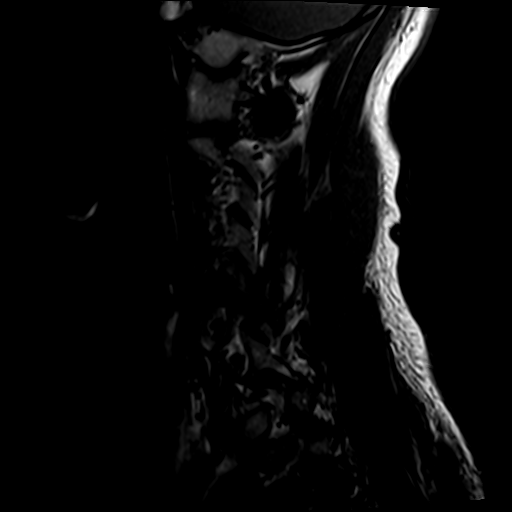
[im 7/13]
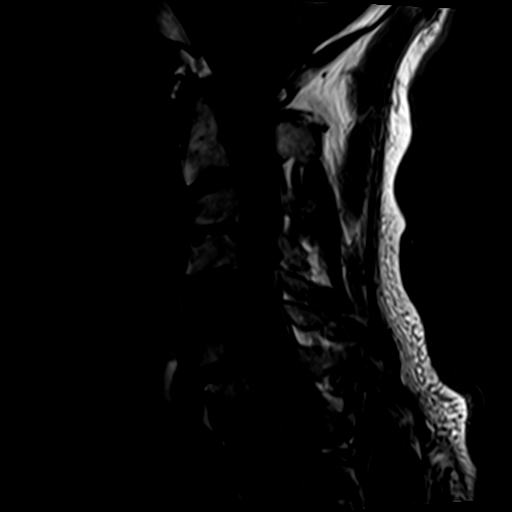
[im 13/13]
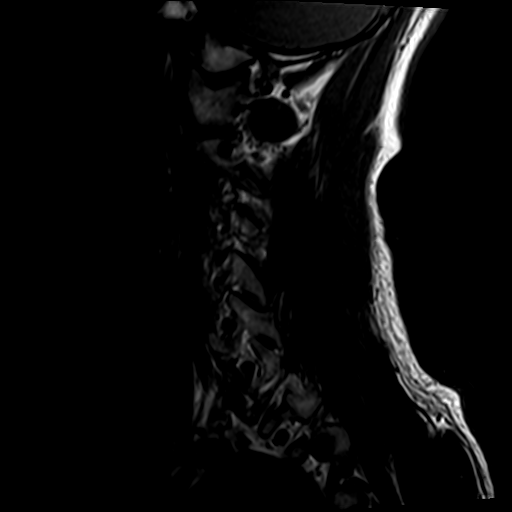

[Series 6: T2 · axial · 3.0mm · 0.39mm/px · z∈[-87,-0]mm · 8 of 26 slices shown]
[im 1/26]
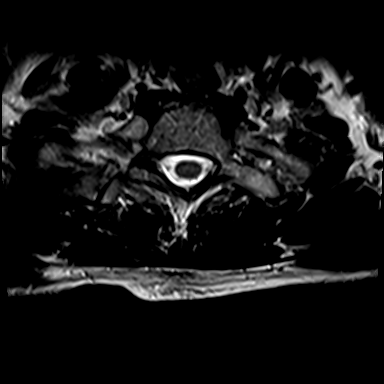
[im 3/26]
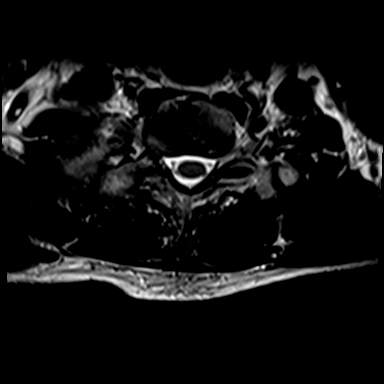
[im 9/26]
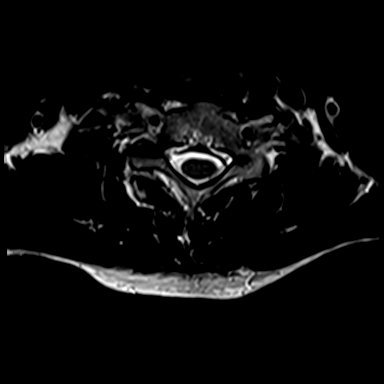
[im 12/26]
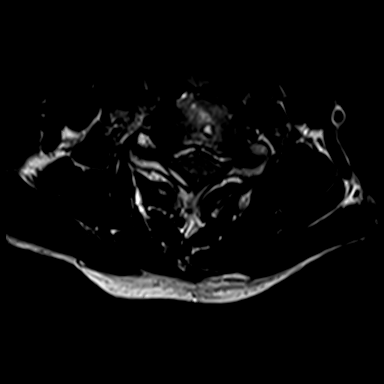
[im 14/26]
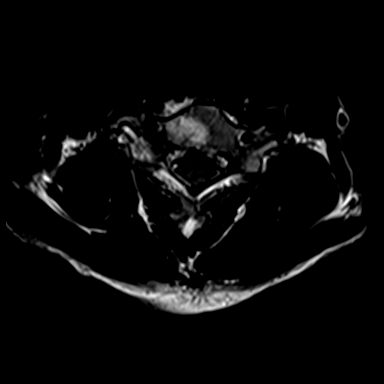
[im 17/26]
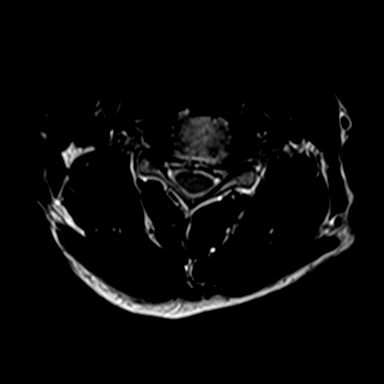
[im 23/26]
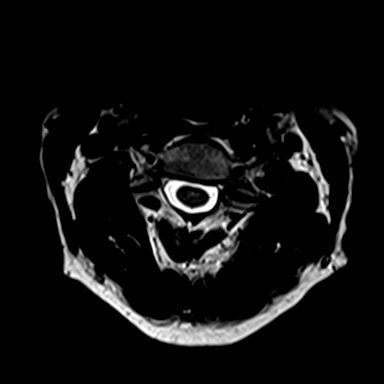
[im 26/26]
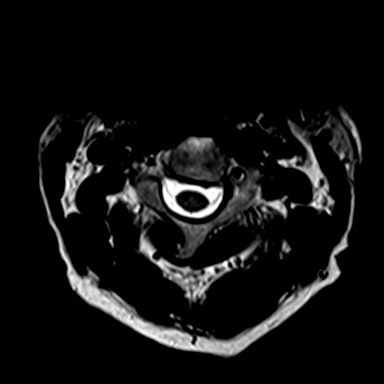

[Series 8: T1 · axial · 3.0mm · 0.35mm/px · z∈[-86,-15]mm · 3 of 27 slices shown (2 of 2)]
[im 3/27]
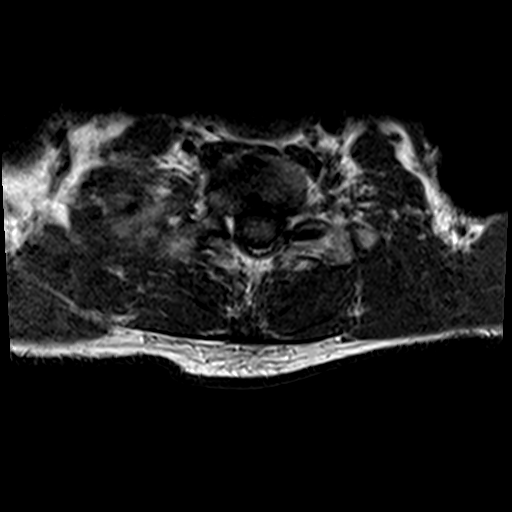
[im 14/27]
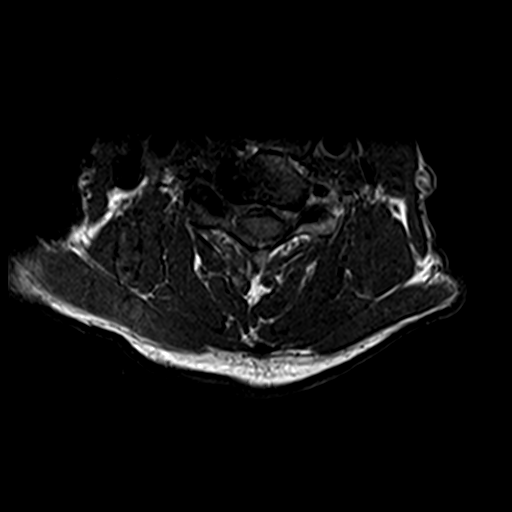
[im 24/27]
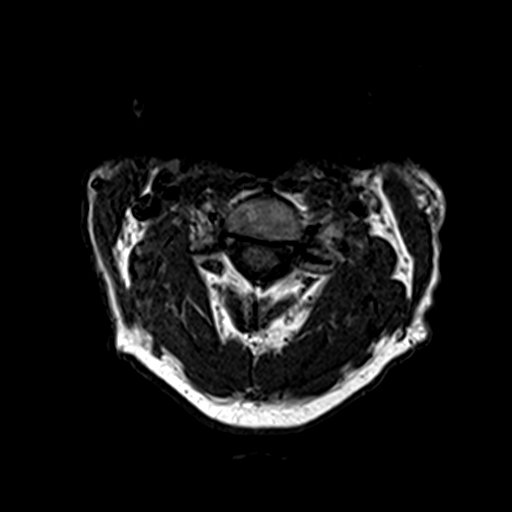

[20 of 48 positions shown; findings below may reference images not displayed]

FINDINGS: Alignment: Stable vertebral height and alignment from the recent
radiographs, including straightening of lower cervical lordosis with
mild retrolisthesis of C4 on C5 and mild reversal of upper cervical
lordosis. There is mild anterolisthesis of C7 on T1.

Vertebrae: Confluent abnormal marrow edema in the right C5 facet,
the right C6 superior articulating facet, and also the right aspect
of the C4, C5, and C6 vertebral bodies. See series 5, images 1-5.
The changes are most pronounced about the right C5-C6 facet and
right C5-C6 disc space. Superimposed chronic degenerative endplate
marrow changes at most cervical levels.

Cord: Despite multilevel cervical spinal stenosis there is no spinal
cord signal abnormality identified.

Posterior Fossa, vertebral arteries, paraspinal tissues: Abnormal
signal in the C4-C5 interspinous ligament (series 5, image 6). Other
anterior and posterior ligamentous complex signal is within normal
limits.

Preserved major vascular flow voids in the neck. Cervicomedullary
junction is within normal limits.

Disc levels:

C2-C3: Mild facet and uncovertebral hypertrophy. Mild to moderate
left C3 foraminal stenosis.

C3-C4: Moderate to severe disc space loss with circumferential disc
osteophyte complex. Broad-based posterior component and right
greater than left uncovertebral hypertrophy. No significant spinal
stenosis. Severe bilateral C4 foraminal stenosis.

C4-C5: Mild retrolisthesis. Moderate to severe disc space loss with
circumferential disc osteophyte complex eccentric to the left.
Broad-based posterior component with ligament flavum hypertrophy and
spinal stenosis with mild spinal cord mass effect (series 7, image
12). Uncovertebral hypertrophy. Severe right greater than left C5
foraminal stenosis.

C5-C6: Moderate to severe disc space loss with right eccentric
circumferential disc osteophyte complex. Broad-based posterior
component. Moderate ligament flavum hypertrophy. Spinal stenosis
with mild spinal cord mass effect. Severe right greater than left C6
foraminal stenosis.

C6-C7: Disc space loss with right eccentric circumferential disc
osteophyte complex and broad-based posterior component. No
significant spinal stenosis. Foraminal disc and uncovertebral
hypertrophy. Moderate to severe bilateral C7 foraminal stenosis.

C7-T1: Mild anterolisthesis. Moderate to severe disc space loss.
Right eccentric circumferential disc osteophyte complex. Moderate
right facet hypertrophy. Bilateral uncovertebral hypertrophy. Mild
left and moderate to severe right C8 foraminal stenosis.

Upper thoracic levels are also remarkable for disc space loss,
circumferential disc osteophyte complex, ligament flavum
hypertrophy, and facet hypertrophy. There is mild spinal stenosis at
T1-T2 and T2-T3 with bilateral neural foraminal stenosis which
appears severe at the right T2 nerve level.
IMPRESSION: 1. Marrow edema in the right C5-C6 facet as well as the C4, C5, and
C6 vertebral bodies. This could be posttraumatic or degenerative,
and in the setting of recent traumatic injury recommend follow-up
noncontrast Cervical Spine CT to exclude underlying fracture.
2. Widespread advanced chronic cervical disc and endplate
degeneration with cervical spinal stenosis C4-C5 and C5-C6 with
spinal cord mass effect but no spinal cord signal abnormality
identified.
3. Widespread multifactorial severe cervical foraminal stenosis
including at the bilateral C4, right greater than left C5 and C6,
bilateral C7 and right C8 nerve levels.

## 2018-06-24 ENCOUNTER — Telehealth: Payer: Self-pay | Admitting: Neurology

## 2018-06-24 NOTE — Telephone Encounter (Signed)
Called the patient to schedule his 3-4 mth follow up with NP to assess his memory. Unable to leave a message due to mailbox was full. If patient calls back he needs a follow up around June/July with NP to assess memory [facility])

## 2018-07-15 DIAGNOSIS — H524 Presbyopia: Secondary | ICD-10-CM | POA: Diagnosis not present

## 2018-07-15 DIAGNOSIS — H5203 Hypermetropia, bilateral: Secondary | ICD-10-CM | POA: Diagnosis not present

## 2018-07-15 DIAGNOSIS — H52223 Regular astigmatism, bilateral: Secondary | ICD-10-CM | POA: Diagnosis not present

## 2018-11-28 DIAGNOSIS — L820 Inflamed seborrheic keratosis: Secondary | ICD-10-CM | POA: Diagnosis not present

## 2018-11-28 DIAGNOSIS — D485 Neoplasm of uncertain behavior of skin: Secondary | ICD-10-CM | POA: Diagnosis not present

## 2018-12-10 DIAGNOSIS — Z0000 Encounter for general adult medical examination without abnormal findings: Secondary | ICD-10-CM | POA: Diagnosis not present

## 2018-12-10 DIAGNOSIS — Z125 Encounter for screening for malignant neoplasm of prostate: Secondary | ICD-10-CM | POA: Diagnosis not present

## 2018-12-10 DIAGNOSIS — R82998 Other abnormal findings in urine: Secondary | ICD-10-CM | POA: Diagnosis not present

## 2018-12-10 DIAGNOSIS — I10 Essential (primary) hypertension: Secondary | ICD-10-CM | POA: Diagnosis not present

## 2018-12-10 DIAGNOSIS — E291 Testicular hypofunction: Secondary | ICD-10-CM | POA: Diagnosis not present

## 2018-12-16 DIAGNOSIS — G4733 Obstructive sleep apnea (adult) (pediatric): Secondary | ICD-10-CM | POA: Diagnosis not present

## 2018-12-16 DIAGNOSIS — Z1212 Encounter for screening for malignant neoplasm of rectum: Secondary | ICD-10-CM | POA: Diagnosis not present

## 2018-12-17 DIAGNOSIS — R7401 Elevation of levels of liver transaminase levels: Secondary | ICD-10-CM | POA: Diagnosis not present

## 2018-12-17 DIAGNOSIS — I714 Abdominal aortic aneurysm, without rupture: Secondary | ICD-10-CM | POA: Diagnosis not present

## 2018-12-17 DIAGNOSIS — E785 Hyperlipidemia, unspecified: Secondary | ICD-10-CM | POA: Diagnosis not present

## 2018-12-17 DIAGNOSIS — E291 Testicular hypofunction: Secondary | ICD-10-CM | POA: Diagnosis not present

## 2018-12-17 DIAGNOSIS — Z1339 Encounter for screening examination for other mental health and behavioral disorders: Secondary | ICD-10-CM | POA: Diagnosis not present

## 2018-12-17 DIAGNOSIS — G4733 Obstructive sleep apnea (adult) (pediatric): Secondary | ICD-10-CM | POA: Diagnosis not present

## 2018-12-17 DIAGNOSIS — Z0000 Encounter for general adult medical examination without abnormal findings: Secondary | ICD-10-CM | POA: Diagnosis not present

## 2018-12-17 DIAGNOSIS — I10 Essential (primary) hypertension: Secondary | ICD-10-CM | POA: Diagnosis not present

## 2018-12-17 DIAGNOSIS — M25562 Pain in left knee: Secondary | ICD-10-CM | POA: Diagnosis not present

## 2018-12-17 DIAGNOSIS — F0390 Unspecified dementia without behavioral disturbance: Secondary | ICD-10-CM | POA: Diagnosis not present

## 2018-12-19 ENCOUNTER — Other Ambulatory Visit: Payer: Self-pay | Admitting: Internal Medicine

## 2018-12-19 DIAGNOSIS — R7401 Elevation of levels of liver transaminase levels: Secondary | ICD-10-CM

## 2018-12-23 DIAGNOSIS — M1712 Unilateral primary osteoarthritis, left knee: Secondary | ICD-10-CM | POA: Diagnosis not present

## 2018-12-25 DIAGNOSIS — M19171 Post-traumatic osteoarthritis, right ankle and foot: Secondary | ICD-10-CM | POA: Diagnosis not present

## 2018-12-25 DIAGNOSIS — M25571 Pain in right ankle and joints of right foot: Secondary | ICD-10-CM | POA: Diagnosis not present

## 2018-12-26 ENCOUNTER — Ambulatory Visit
Admission: RE | Admit: 2018-12-26 | Discharge: 2018-12-26 | Disposition: A | Discharge status: Home / Self Care | Payer: 59 | Source: Ambulatory Visit | Attending: Internal Medicine | Admitting: Internal Medicine

## 2018-12-26 DIAGNOSIS — R7401 Elevation of levels of liver transaminase levels: Secondary | ICD-10-CM

## 2018-12-26 DIAGNOSIS — K7689 Other specified diseases of liver: Secondary | ICD-10-CM | POA: Diagnosis not present

## 2018-12-30 ENCOUNTER — Other Ambulatory Visit: Payer: Self-pay | Admitting: Internal Medicine

## 2018-12-30 DIAGNOSIS — R7401 Elevation of levels of liver transaminase levels: Secondary | ICD-10-CM

## 2019-02-03 NOTE — Progress Notes (Signed)
PATIENT: MALE MINISH DOB: 12-09-1950  REASON FOR VISIT: follow up HISTORY FROM: patient  Chief Complaint  Patient presents with  . Follow-up    RM6. alone. Getting to use to the machine. Has habit of forgetting the machine on.     HISTORY OF PRESENT ILLNESS: Today 02/04/19 Joshua KLUTTZ is a 68 y.o. male here today for follow up for complex sleep apnea on CPAP and memory concerns. He was last seen by Dr Brett Fairy on 04/2018 and had suboptimal compliance with review of download report. 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 He recently had CPE with PCP and was concerned of continued fatigue and memory loss. He admits that he often falls asleep without placing the CPAP.   Compliance report dated 01/04/2019 through 02/02/2019 reveals that he used CPAP 25 of the last 30 days for compliance of 83%.  14 days he used CPAP greater than 4 hours for compliance of 47%.  Average usage for total days used was 3 hours and 57 minutes.  Residual AHI was 1.7 on 7 cm of water and an EPR of 2.  There was no significant leak noted.  He has concerns of word finding issues and inattention. He does feel that it is more difficulty to carry on conversations that it used to be. He does admit to a history of anxiety and depression. He is currently taking bupropion. He likes things neat and clean. He likes to be active. He does misplace items frequently. He previously took Ritalin that he feels helped with being able to multi task. He has not taken this recently as he is retired. He has two degrees (history and mechanical engineering). He worked as a Land for about 30 years. He exercises regularly. He tries to eat a healthy diet. He denies changes in gait, swallowing, or tremors. He reports a possible family history of dementia with his father.   HISTORY: (copied from Dr Dohmeier's note on 05/07/2018)  HPI:  Joshua [name] is a 68  y.o. male patient, he is an established sleep patient.  My personally last visit with the patient was on 21 December 2016.  He was seen here for sleep evaluation had been prior to that date diagnosed with obstructive sleep apnea in 2015 but initially did not want to use CPAP.  When he saw me his sleep study was 68 years old and he had also acquired additional diagnoses such as hypogonadism, he had ankle surgery and chronic back pain and used pain medication, carries a diagnosis of ADHD, hypertension, hyperlipidemia and for status post anterior neck fusion performed by Dr. [facility] Senate.  As he had met his deductible in the year 2018 he wanted to have his sleep study rather ASAP.  The sleep study had resulted in complex central dominant sleep apnea the AHI was 16.6/h the RDI was 33.3/h, supine sleep position exacerbated the AHI to 30.9 by REM sleep related AHI was actually very low at only 4.9/h.  He will be titrated an attended sleep study given the central nature of his apnea.   CPAP was able to reduce the AHI finally but caused some central apneas to emerge at higher pressures.  He seemed to tolerate 6 cm water pressure the very best and I wrote a prescription for an autotitrator on 23 January 2017 in order to be delivered during his insurance covered calendar year.  The  patient's CPAP compliance data were provided by his DME [facility],  showing that by 05 May 2018 he had used the machine 73% of all days and 53% of the time over 4 hours.   The machine is relatively new as I indicated.  The average user time in all days was 3 hours 60 minutes, CPAP was set at 7 cmH2O pressure was 2 cm EPR the residual AHI was only 2.3 with 0.7 central apneas per hour and 0.4 obstructive apneas per hour.  Based on this the settings do not have to change.  I asked the patient by compliance also difficult for him to achieve and he states that he is using a dream wear mask which does not allow him to read as he cannot use his  reading glasses.  I stated that we could use other interfaces but he needs to increase his compliance and show more discipline he does have a lot of air leakage but air leakage had improved after 18 March when apparently he tried a different mask set up.  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  There is a question if this is a manifestation of adult ADD or ADHD and he states that Ritalin seems to help.  His BMI is 23.3, his blood pressures in the high normal range, he is not on chronic pain at night.     I suggested that she should follow-up with a face-to-face revisit with either me or nurse practitioner here in the office in 3 months my goal is for him to have a Montreal cognitive assessment test.  I would also be happy to refer him for ADD or ADHD testing.  I urged him to improve his compliance.  I also warned him that low compliance may lead his insurance to drug coverage for future supplies.   REVIEW OF SYSTEMS: Out of a complete 14 system review of symptoms, the patient complains only of the following symptoms, memory loss and all other reviewed systems are negative.  Epworth sleepiness scale: 9 Fatigue severity scale: 13  ALLERGIES: Allergies  Allergen Reactions  . No Known Allergies     HOME MEDICATIONS: Outpatient Medications Prior to Visit  Medication Sig Dispense Refill  . amLODipine (NORVASC) 5 MG tablet Take by mouth.    Marland Kitchen aspirin 81 MG tablet Take 81 mg by mouth daily.    Marland Kitchen buPROPion (WELLBUTRIN SR) 100 MG 12 hr tablet Take 100 mg by mouth 2 (two) times daily.    Marland Kitchen DEPO-TESTOSTERONE 200 MG/ML injection Inject 100 mg into the muscle every 21 ( twenty-one) days.   3  . famciclovir (FAMVIR) 125 MG tablet Take 125 mg by mouth 2 (two) times daily as needed.    . gabapentin (NEURONTIN) 300 MG capsule Take 300 mg  by mouth daily. To reduce pain     . losartan-hydrochlorothiazide (HYZAAR) 50-12.5 MG per tablet Take 1 tablet by mouth daily.    . methylphenidate (RITALIN SR) 20 MG ER tablet Take 20 mg by mouth daily as needed.     . metoprolol succinate (TOPROL-XL) 25 MG 24 hr tablet Take 25 mg by mouth daily.    . Multiple Vitamin (MULTIVITAMINS PO) Take 1 tablet by mouth daily.    . simvastatin (ZOCOR) 40 MG tablet Take 40 mg by mouth daily.  No facility-administered medications prior to visit.    PAST MEDICAL HISTORY: Past Medical History:  Diagnosis Date  . Anemia    as a child  . Anxiety   . Arthritis   . Depression    takes Wellbutrin daily  . Hyperlipidemia    takes Simvastatin daily  . Hypertension    takes Metoprolol and Hyzaar daily   . Muscle spasm    takes Robaxin as needed  . Nerve pain    takes Gabapentin as needed  . OSA (obstructive sleep apnea)   . OSA (obstructive sleep apnea) 12/01/2013   16.6 AHI and RDI of over 30. REM AHI over 30.    . 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 Urinary frequency   . Weakness    numbness and tingling in right arm down to fingers    PAST SURGICAL HISTORY: Past Surgical History:  Procedure Laterality Date  . ANTERIOR CERVICAL DECOMP/DISCECTOMY FUSION N/A 06/28/2016   Procedure: ANTERIOR CERVICAL DECOMPRESSION FUSION CERVICAL THREE-FOUR ,CERVICAL FOUR-FIVE,CERVICAL FIVE-SIX;  Surgeon: Jovita Gamma, MD;  Location: [facility];  Service: Neurosurgery;  Laterality: N/A;  . COLONOSCOPY    . lap inguinal hernia repair wiht mesh Bilateral 10/2003  . REFRACTIVE SURGERY  2003  . TONSILLECTOMY    . wisdom teeth extracted      FAMILY HISTORY: Family History  Problem Relation Age of Onset  . Lung disease Father   . Parkinson's disease Father   . Lymphoma Mother     SOCIAL HISTORY: Social History   Socioeconomic History  . Marital status: Married    Spouse name: [name]  . Number of  children: 2  . Years of education: College  . Highest education level: Not on file  Occupational History    Employer: [employer]  Tobacco Use  . Smoking status: Never Smoker  . Smokeless tobacco: Never Used  Substance and Sexual Activity  . Alcohol use: Yes    Comment: socially  . Drug use: No  . Sexual activity: Not on file  Other Topics Concern  . Not on file  Social History Narrative   Patient is married [name])   Patient has two children.   Patient drinks two caffeine drinks per day.   Patient is right-handed.   Patient has a college education.   Social Determinants of Health   Financial Resource Strain:   . Difficulty of Paying Living Expenses: Not on file  Food Insecurity:   . Worried About Charity fundraiser in the Last Year: Not on file  . Ran Out of Food in the Last Year: Not on file  Transportation Needs:   . Lack of Transportation (Medical): Not on file  . Lack of Transportation (Non-Medical): Not on file  Physical Activity:   . Days of Exercise per Week: Not on file  . Minutes of Exercise per Session: Not on file  Stress:   . Feeling of Stress : Not on file  Social Connections:   . Frequency of Communication with Friends and Family: Not on file  . Frequency of Social Gatherings with Friends and Family: Not on file  . Attends Religious Services: Not on file  . Active Member of Clubs or Organizations: Not on file  . Attends Archivist Meetings: Not on file  . Marital Status: Not on file  Intimate Partner Violence:   . Fear of Current or Ex-Partner: Not on file  . Emotionally Abused:  Not on file  . Physically Abused: Not on file  . Sexually Abused: Not on file      PHYSICAL EXAM  Vitals:   02/04/19 0847  BP: 137/84  Pulse: 63  Temp: (!) 97.2 F (36.2 C)  Weight: 151 lb (68.5 kg)  Height: _0  (1.702 m)   Body mass index is 23.65 kg/m.  Generalized: Well developed, in no acute distress  Cardiology: normal rate and rhythm, no  murmur noted Respiratory: Clear to auscultation bilaterally Neurological examination  Mentation: Alert oriented to time, place, history taking. Follows all commands speech and language fluent Cranial nerve II-XII: Pupils were equal round reactive to light. Extraocular movements were full, visual field were full on confrontational test. Facial sensation and strength were normal. Uvula tongue midline. Head turning and shoulder shrug  were normal and symmetric. Motor: The motor testing reveals 5 over 5 strength of all 4 extremities. Good symmetric motor tone is noted throughout.  No bradykinesia or tremor is noted Sensory: Sensory testing is intact to soft touch on all 4 extremities. No evidence of extinction is noted.  Coordination: Cerebellar testing reveals good finger-nose-finger and heel-to-shin bilaterally.  Gait and station: Gait is normal.   DIAGNOSTIC DATA (LABS, IMAGING, TESTING) - I reviewed patient records, labs, notes, testing and imaging myself where available.  MMSE - Mini Mental State Exam 02/04/2019  Orientation to time 4  Orientation to Place 4  Registration 3  Attention/ Calculation 1  Recall 3  Language- name 2 objects 2  Language- repeat 1  Language- follow 3 step command 3  Language- read & follow direction 1  Write a sentence 1  Copy design 1  Total score 24     Lab Results  Component Value Date   WBC 7.1 06/22/2016   HGB 15.7 06/22/2016   HCT 46.9 06/22/2016   MCV 89.3 06/22/2016   PLT 253 06/22/2016      Component Value Date/Time   NA 140 06/22/2016 0852   NA 142 10/03/2013 1111   K 4.2 06/22/2016 0852   CL 105 06/22/2016 0852   CO2 27 06/22/2016 0852   GLUCOSE 73 06/22/2016 0852   BUN 24 (H) 06/22/2016 0852   BUN 23 10/03/2013 1111   CREATININE 1.14 06/22/2016 0852   CALCIUM 9.1 06/22/2016 0852   PROT 6.5 10/03/2013 1111   ALBUMIN 4.1 10/03/2013 1111   AST 27 10/03/2013 1111   ALT 25 10/03/2013 1111   ALKPHOS 78 10/03/2013 1111   BILITOT 0.8  10/03/2013 1111   GFRNONAA >60 06/22/2016 0852   GFRAA >60 06/22/2016 0852   No results found for: CHOL, HDL, LDLCALC, LDLDIRECT, TRIG, CHOLHDL No results found for: HGBA1C No results found for: VITAMINB12 No results found for: TSH     ASSESSMENT AND PLAN 68 y.o. year old male  has a past medical history of Anemia, Anxiety, Arthritis, Depression, Hyperlipidemia, Hypertension, Muscle spasm, Nerve pain, OSA (obstructive sleep apnea), OSA (obstructive sleep apnea) (12/01/2013), Pars defect, Urinary frequency, and Weakness. here with     ICD-10-CM   1. OSA on CPAP  G47.33    Z99.89   2. Complex sleep apnea syndrome  G47.31   3. Memory loss  R41.3 MR BRAIN W WO CONTRAST    Ambulatory referral to Neuropsychology  4. History of attention deficit disorder  Z86.59 Ambulatory referral to Neuropsychology    Joshua [name] continues to struggle with nightly compliance of CPAP therapy.  He admits that he often falls asleep without placing therapy.  We have discussed the role of untreated sleep apnea in comorbidities including concerns of memory loss.  He was encouraged to place CPAP as soon as he feels he may drift to sleep.  He was encouraged to use CPAP nightly and for greater than 4 hours each night.  MMSE was abnormal, especially considering patient's education history and career.  He was unable to perform serial subtractions.  I do feel that there is a level of anxiety relating to memory loss and word finding concerns.  He does have a history of attention deficit that could be contributing as well.  He will continue close follow-up with primary care for these concerns.  I do feel that it would be helpful to evaluate concerns with MRI of the brain due to family history of dementia.  We will also order a formal cognitive test to help evaluate concerns.  He will continue focusing on memory compensation strategies.  Healthy lifestyle with well-balanced diet and regular exercise advised.  Adequate hydration  discussed.  We will follow-up in 3 months, sooner if needed.  He verbalizes understanding and agreement with this plan.   Orders Placed This Encounter  Procedures  . MR BRAIN W WO CONTRAST    Standing Status:   Future    Standing Expiration Date:   04/05/2020    Order Specific Question:   If indicated for the ordered procedure, I authorize the administration of contrast media per Radiology protocol    Answer:   Yes    Order Specific Question:   What is the patient's sedation requirement?    Answer:   No Sedation    Order Specific Question:   Does the patient have a pacemaker or implanted devices?    Answer:   No    Order Specific Question:   Radiology Contrast Protocol - do NOT remove file path    Answer:   [email or web address]    Order Specific Question:   Preferred imaging location?    Answer:   Internal  . Ambulatory referral to Neuropsychology    Referral Priority:   Routine    Referral Type:   Psychiatric    Referral Reason:   Specialty Services Required    Requested Specialty:   Psychology    Number of Visits Requested:   1     No orders of the defined types were placed in this encounter.     I spent 15 minutes with the patient. 50% of this time was spent counseling and educating patient on plan of care and medications.    Debbora Presto, FNP-C 02/04/2019, 10:12 AM [facility] [address] [place], [place] [address] [phone]

## 2019-02-04 ENCOUNTER — Ambulatory Visit (INDEPENDENT_AMBULATORY_CARE_PROVIDER_SITE_OTHER): Payer: 59 | Admitting: Family Medicine

## 2019-02-04 ENCOUNTER — Encounter: Payer: Self-pay | Admitting: Family Medicine

## 2019-02-04 ENCOUNTER — Other Ambulatory Visit: Payer: Self-pay

## 2019-02-04 VITALS — BP 137/84 | HR 63 | Temp 97.2°F | Ht 67.0 in | Wt 151.0 lb

## 2019-02-04 DIAGNOSIS — Z8659 Personal history of other mental and behavioral disorders: Secondary | ICD-10-CM

## 2019-02-04 DIAGNOSIS — Z9989 Dependence on other enabling machines and devices: Secondary | ICD-10-CM | POA: Diagnosis not present

## 2019-02-04 DIAGNOSIS — R413 Other amnesia: Secondary | ICD-10-CM

## 2019-02-04 DIAGNOSIS — G4731 Primary central sleep apnea: Secondary | ICD-10-CM | POA: Diagnosis not present

## 2019-02-04 DIAGNOSIS — G4733 Obstructive sleep apnea (adult) (pediatric): Secondary | ICD-10-CM | POA: Diagnosis not present

## 2019-02-04 NOTE — Patient Instructions (Signed)
Please work on daily compliance of CPAP  We will order a MRI of brain and formal cognitive testing for memory concerns  Continue healthy diet and regular physical exercise  Follow up in 3 months    Memory Compensation Strategies  1. Use "WARM" strategy.  W= write it down  A= associate it  R= repeat it  M= make a mental note  2.   You can keep a Social worker.  Use a 3-ring notebook with sections for the following: calendar, important names and phone numbers,  medications, doctors' names/phone numbers, lists/reminders, and a section to journal what you did  each day.   3.    Use a calendar to write appointments down.  4.    Write yourself a schedule for the day.  This can be placed on the calendar or in a separate section of the Memory Notebook.  Keeping a  regular schedule can help memory.  5.    Use medication organizer with sections for each day or morning/evening pills.  You may need help loading it  6.    Keep a basket, or pegboard by the door.  Place items that you need to take out with you in the basket or on the pegboard.  You may also want to  include a message board for reminders.  7.    Use sticky notes.  Place sticky notes with reminders in a place where the task is performed.  For example: " turn off the  stove" placed by the stove, "lock the door" placed on the door at eye level, " take your medications" on  the bathroom mirror or by the place where you normally take your medications.  8.    Use alarms/timers.  Use while cooking to remind yourself to check on food or as a reminder to take your medicine, or as a  reminder to make a call, or as a reminder to perform another task, etc.   Sleep Apnea Sleep apnea affects breathing during sleep. It causes breathing to stop for a short time or to become shallow. It can also increase the risk of:  Heart attack.  Stroke.  Being very overweight (obese).  Diabetes.  Heart failure.  Irregular heartbeat. The  goal of treatment is to help you breathe normally again. What are the causes? There are three kinds of sleep apnea:  Obstructive sleep apnea. This is caused by a blocked or collapsed airway.  Central sleep apnea. This happens when the brain does not send the right signals to the muscles that control breathing.  Mixed sleep apnea. This is a combination of obstructive and central sleep apnea. The most common cause of this condition is a collapsed or blocked airway. This can happen if:  Your throat muscles are too relaxed.  Your tongue and tonsils are too large.  You are overweight.  Your airway is too small. What increases the risk?  Being overweight.  Smoking.  Having a small airway.  Being older.  Being male.  Drinking alcohol.  Taking medicines to calm yourself (sedatives or tranquilizers).  Having family members with the condition. What are the signs or symptoms?  Trouble staying asleep.  Being sleepy or tired during the day.  Getting angry a lot.  Loud snoring.  Headaches in the morning.  Not being able to focus your mind (concentrate).  Forgetting things.  Less interest in sex.  Mood swings.  Personality changes.  Feelings of sadness (depression).  Waking up a lot during  the night to pee (urinate).  Dry mouth.  Sore throat. How is this diagnosed?  Your medical history.  A physical exam.  A test that is done when you are sleeping (sleep study). The test is most often done in a sleep lab but may also be done at home. How is this treated?   Sleeping on your side.  Using a medicine to get rid of mucus in your nose (decongestant).  Avoiding the use of alcohol, medicines to help you relax, or certain pain medicines (narcotics).  Losing weight, if needed.  Changing your diet.  Not smoking.  Using a machine to open your airway while you sleep, such as: ? An oral appliance. This is a mouthpiece that shifts your lower jaw forward. ? A  CPAP device. This device blows air through a mask when you breathe out (exhale). ? An EPAP device. This has valves that you put in each nostril. ? A BPAP device. This device blows air through a mask when you breathe in (inhale) and breathe out.  Having surgery if other treatments do not work. It is important to get treatment for sleep apnea. Without treatment, it can lead to:  High blood pressure.  Coronary artery disease.  In men, not being able to have an erection (impotence).  Reduced thinking ability. Follow these instructions at home: Lifestyle  Make changes that your doctor recommends.  Eat a healthy diet.  Lose weight if needed.  Avoid alcohol, medicines to help you relax, and some pain medicines.  Do not use any products that contain nicotine or tobacco, such as cigarettes, e-cigarettes, and chewing tobacco. If you need help quitting, ask your doctor. General instructions  Take over-the-counter and prescription medicines only as told by your doctor.  If you were given a machine to use while you sleep, use it only as told by your doctor.  If you are having surgery, make sure to tell your doctor you have sleep apnea. You may need to bring your device with you.  Keep all follow-up visits as told by your doctor. This is important. Contact a doctor if:  The machine that you were given to use during sleep bothers you or does not seem to be working.  You do not get better.  You get worse. Get help right away if:  Your chest hurts.  You have trouble breathing in enough air.  You have an uncomfortable feeling in your back, arms, or stomach.  You have trouble talking.  One side of your body feels weak.  A part of your face is hanging down. These symptoms may be an emergency. Do not wait to see if the symptoms will go away. Get medical help right away. Call your local emergency services (911 in the [place] Do not drive yourself to the hospital. Summary  This  condition affects breathing during sleep.  The most common cause is a collapsed or blocked airway.  The goal of treatment is to help you breathe normally while you sleep. This information is not intended to replace advice given to you by your health care provider. Make sure you discuss any questions you have with your health care provider. Document Released: 11/02/2007 Document Revised: 11/09/2017 Document Reviewed: 09/18/2017 Elsevier Patient Education  2020 [employer].

## 2019-02-12 DIAGNOSIS — M1712 Unilateral primary osteoarthritis, left knee: Secondary | ICD-10-CM | POA: Diagnosis not present

## 2019-02-13 ENCOUNTER — Encounter: Payer: Self-pay | Admitting: Psychology

## 2019-02-14 ENCOUNTER — Telehealth: Payer: Self-pay | Admitting: Psychology

## 2019-02-14 NOTE — Telephone Encounter (Signed)
The nursing staff do not precert for neuropysch testing. We do put in the referrals. Is Dr. Melvyn Novas not approved with [facility] yet? Otherwise, will need to clarify with Hoyle Sauer on who would precert.

## 2019-02-14 NOTE — Telephone Encounter (Signed)
Autumn from [employer] called and left a message with Access Nurse in regards to this patient.   Caller requesting prior authorization for psychological testing and neuro psychological testing scheduled 02/20/2019. They also needs additional information.

## 2019-02-14 NOTE — Telephone Encounter (Signed)
This is a new patient at [facility] coming to establish care with Dr. Melvyn Novas. Patient scheduled on 02/20/19 and was referred to Dr. Melvyn Novas by Dr. Debbora Presto.   Routing call to Dr. Ubaldo Glassing for her office to follow up with [facility] about the prior authorization.

## 2019-02-14 NOTE — Telephone Encounter (Signed)
Routing to [place] for review.

## 2019-02-17 NOTE — Telephone Encounter (Signed)
Can you help me direct this message? Would Joshua [name] hep get authorization?

## 2019-02-18 NOTE — Telephone Encounter (Signed)
I have called talked to patient and relayed that our office did not do auth for Neuropsychology you just pay your normal co pay and its billed. I called over to [place] as well and they stated they have never did auth for  Neuropsychology . I tried to explain this to the patient he just could not grasp it. Velora Heckler is also going to call him back.  Patient is scared because he has a 3.,000 deductible and has not meet yet. Patient states he will call back after he talks to social security. I relayed to patient that social security would not be able to help him . Patient states hold off on apt.

## 2019-02-18 NOTE — Telephone Encounter (Signed)
I have never did a pre cert  For this . There office need to do pre cert  I will call and talk to there office

## 2019-02-20 ENCOUNTER — Encounter: Payer: 59 | Admitting: Psychology

## 2019-02-20 DIAGNOSIS — L259 Unspecified contact dermatitis, unspecified cause: Secondary | ICD-10-CM | POA: Diagnosis not present

## 2019-02-20 DIAGNOSIS — F0390 Unspecified dementia without behavioral disturbance: Secondary | ICD-10-CM | POA: Diagnosis not present

## 2019-02-20 DIAGNOSIS — D751 Secondary polycythemia: Secondary | ICD-10-CM | POA: Diagnosis not present

## 2019-02-20 DIAGNOSIS — G4733 Obstructive sleep apnea (adult) (pediatric): Secondary | ICD-10-CM | POA: Diagnosis not present

## 2019-02-20 DIAGNOSIS — I714 Abdominal aortic aneurysm, without rupture: Secondary | ICD-10-CM | POA: Diagnosis not present

## 2019-02-20 DIAGNOSIS — M25562 Pain in left knee: Secondary | ICD-10-CM | POA: Diagnosis not present

## 2019-02-20 DIAGNOSIS — F900 Attention-deficit hyperactivity disorder, predominantly inattentive type: Secondary | ICD-10-CM | POA: Diagnosis not present

## 2019-02-20 DIAGNOSIS — Z1331 Encounter for screening for depression: Secondary | ICD-10-CM | POA: Diagnosis not present

## 2019-02-20 DIAGNOSIS — R7401 Elevation of levels of liver transaminase levels: Secondary | ICD-10-CM | POA: Diagnosis not present

## 2019-02-20 DIAGNOSIS — I10 Essential (primary) hypertension: Secondary | ICD-10-CM | POA: Diagnosis not present

## 2019-03-03 ENCOUNTER — Encounter: Payer: 59 | Admitting: Psychology

## 2019-03-11 ENCOUNTER — Encounter: Payer: Self-pay | Admitting: *Deleted

## 2019-03-11 NOTE — Progress Notes (Signed)
  RE: Prior Authorization for a New Patient referred to Dr. Melvyn Novas Received: 2 weeks ago [place], [place], [name], [name]  I was able to get an approximate contracted price for the codes listed below through [facility]. I used for [id number] (4 units) although it could vary, I went with the high end. Based on the codes listed and the information that I could pull from [facility] the estimate for the patients visit is roughly $1798.00. Keep in mind as Joshua [name] stated that the deductible will play a part in how much the patient owes and the patient should know how much of his deductible he has met if any. Patient out of pocket charges could be approximately $359.60. Once again this is subject to his payer and deductible. This would be a best case scenario.        Previous Messages   ----- Message -----  From: Azalee Course  Sent: 02/19/2019  4:53 PM EST  To: Lawana Pai  Subject: RE: Prior Authorization for a New Patient re*   I called care management number off his card [phone]. They sent me a fax with a passcode to be able to speak with a representative. I called back and spoke with [place]. I gave her all the CPT codes and she said we will need to talk to the billing department to get the contract rate for these CPT codes. Once we have the CPT code contract rate we can add up the total according to the units ballparked and the units that are definite. Then once we know how much we are billing him it is applied to the deductible and once the deductible is met insurance will cover 80%. She said that is how it is handled when in-network I said we are part of the network.  Joshua [name]  ----- Message -----  From: Leeroy Bock  Sent: 02/18/2019 11:23 AM EST  To: Cheron Schaumann  Subject: Prior Authorization for a New Patient referr*    [name], Joshua Hatchet Joshua [name],   This is a new patient referred to Dr. Melvyn Novas for testing. He  is requesting prior authorization before he is seen. Hoyle Sauer asked me to send you the codes to help take care of this request for the patient. Will you help with this please? The patient will reschedule once this has been done.   The codes are:  09323, 1 unit  96132, 1 unit  96133, generally 2 units  96138, 1 unit  96139, varies 2-4 units   Thank you,   Joshua [name]

## 2019-03-26 ENCOUNTER — Other Ambulatory Visit (HOSPITAL_COMMUNITY): Payer: Self-pay | Admitting: Internal Medicine

## 2019-05-07 ENCOUNTER — Ambulatory Visit: Payer: 59 | Admitting: Family Medicine

## 2019-06-11 ENCOUNTER — Other Ambulatory Visit (HOSPITAL_COMMUNITY): Payer: Self-pay | Admitting: Internal Medicine

## 2019-06-18 DIAGNOSIS — H524 Presbyopia: Secondary | ICD-10-CM | POA: Diagnosis not present

## 2019-06-18 DIAGNOSIS — H52223 Regular astigmatism, bilateral: Secondary | ICD-10-CM | POA: Diagnosis not present

## 2019-06-18 DIAGNOSIS — H5203 Hypermetropia, bilateral: Secondary | ICD-10-CM | POA: Diagnosis not present

## 2019-06-19 ENCOUNTER — Ambulatory Visit
Admission: RE | Admit: 2019-06-19 | Discharge: 2019-06-19 | Disposition: A | Discharge status: Home / Self Care | Payer: 59 | Source: Ambulatory Visit | Attending: Internal Medicine | Admitting: Internal Medicine

## 2019-06-19 DIAGNOSIS — R7401 Elevation of levels of liver transaminase levels: Secondary | ICD-10-CM

## 2019-06-19 DIAGNOSIS — K7689 Other specified diseases of liver: Secondary | ICD-10-CM | POA: Diagnosis not present

## 2019-06-24 DIAGNOSIS — D751 Secondary polycythemia: Secondary | ICD-10-CM | POA: Diagnosis not present

## 2019-06-24 DIAGNOSIS — G4733 Obstructive sleep apnea (adult) (pediatric): Secondary | ICD-10-CM | POA: Diagnosis not present

## 2019-06-24 DIAGNOSIS — I10 Essential (primary) hypertension: Secondary | ICD-10-CM | POA: Diagnosis not present

## 2019-06-24 DIAGNOSIS — M67442 Ganglion, left hand: Secondary | ICD-10-CM | POA: Diagnosis not present

## 2019-06-24 DIAGNOSIS — R3911 Hesitancy of micturition: Secondary | ICD-10-CM | POA: Diagnosis not present

## 2019-06-24 DIAGNOSIS — E291 Testicular hypofunction: Secondary | ICD-10-CM | POA: Diagnosis not present

## 2019-06-24 DIAGNOSIS — R7401 Elevation of levels of liver transaminase levels: Secondary | ICD-10-CM | POA: Diagnosis not present

## 2019-07-02 DIAGNOSIS — M71342 Other bursal cyst, left hand: Secondary | ICD-10-CM | POA: Diagnosis not present

## 2019-07-02 DIAGNOSIS — M1812 Unilateral primary osteoarthritis of first carpometacarpal joint, left hand: Secondary | ICD-10-CM | POA: Diagnosis not present

## 2019-07-02 DIAGNOSIS — M19032 Primary osteoarthritis, left wrist: Secondary | ICD-10-CM | POA: Diagnosis not present

## 2019-07-02 DIAGNOSIS — M25842 Other specified joint disorders, left hand: Secondary | ICD-10-CM | POA: Diagnosis not present

## 2019-07-03 ENCOUNTER — Other Ambulatory Visit (HOSPITAL_COMMUNITY): Payer: Self-pay | Admitting: Urology

## 2019-07-03 DIAGNOSIS — R3912 Poor urinary stream: Secondary | ICD-10-CM | POA: Diagnosis not present

## 2019-07-03 DIAGNOSIS — R350 Frequency of micturition: Secondary | ICD-10-CM | POA: Diagnosis not present

## 2019-07-03 DIAGNOSIS — N401 Enlarged prostate with lower urinary tract symptoms: Secondary | ICD-10-CM | POA: Diagnosis not present

## 2019-09-08 ENCOUNTER — Other Ambulatory Visit (HOSPITAL_COMMUNITY): Payer: Self-pay | Admitting: Internal Medicine

## 2019-09-25 DIAGNOSIS — D2272 Melanocytic nevi of left lower limb, including hip: Secondary | ICD-10-CM | POA: Diagnosis not present

## 2019-09-25 DIAGNOSIS — D225 Melanocytic nevi of trunk: Secondary | ICD-10-CM | POA: Diagnosis not present

## 2019-09-25 DIAGNOSIS — L570 Actinic keratosis: Secondary | ICD-10-CM | POA: Diagnosis not present

## 2019-09-25 DIAGNOSIS — L821 Other seborrheic keratosis: Secondary | ICD-10-CM | POA: Diagnosis not present

## 2019-10-21 ENCOUNTER — Other Ambulatory Visit (HOSPITAL_COMMUNITY): Payer: Self-pay | Admitting: Internal Medicine

## 2019-11-18 ENCOUNTER — Other Ambulatory Visit (HOSPITAL_COMMUNITY): Payer: Self-pay | Admitting: Internal Medicine

## 2019-12-01 ENCOUNTER — Other Ambulatory Visit (HOSPITAL_COMMUNITY): Payer: Self-pay | Admitting: Orthopedic Surgery

## 2019-12-08 ENCOUNTER — Other Ambulatory Visit (HOSPITAL_COMMUNITY): Payer: Self-pay | Admitting: Internal Medicine

## 2019-12-18 DIAGNOSIS — Z125 Encounter for screening for malignant neoplasm of prostate: Secondary | ICD-10-CM | POA: Diagnosis not present

## 2019-12-18 DIAGNOSIS — Z0000 Encounter for general adult medical examination without abnormal findings: Secondary | ICD-10-CM | POA: Diagnosis not present

## 2019-12-18 DIAGNOSIS — E785 Hyperlipidemia, unspecified: Secondary | ICD-10-CM | POA: Diagnosis not present

## 2019-12-18 DIAGNOSIS — E291 Testicular hypofunction: Secondary | ICD-10-CM | POA: Diagnosis not present

## 2019-12-22 ENCOUNTER — Other Ambulatory Visit (HOSPITAL_COMMUNITY): Payer: Self-pay | Admitting: Internal Medicine

## 2019-12-25 DIAGNOSIS — R5383 Other fatigue: Secondary | ICD-10-CM | POA: Diagnosis not present

## 2019-12-25 DIAGNOSIS — M25571 Pain in right ankle and joints of right foot: Secondary | ICD-10-CM | POA: Diagnosis not present

## 2019-12-25 DIAGNOSIS — R82998 Other abnormal findings in urine: Secondary | ICD-10-CM | POA: Diagnosis not present

## 2019-12-25 DIAGNOSIS — G4733 Obstructive sleep apnea (adult) (pediatric): Secondary | ICD-10-CM | POA: Diagnosis not present

## 2019-12-25 DIAGNOSIS — I714 Abdominal aortic aneurysm, without rupture: Secondary | ICD-10-CM | POA: Diagnosis not present

## 2019-12-25 DIAGNOSIS — M25562 Pain in left knee: Secondary | ICD-10-CM | POA: Diagnosis not present

## 2019-12-25 DIAGNOSIS — Z0000 Encounter for general adult medical examination without abnormal findings: Secondary | ICD-10-CM | POA: Diagnosis not present

## 2019-12-25 DIAGNOSIS — I10 Essential (primary) hypertension: Secondary | ICD-10-CM | POA: Diagnosis not present

## 2019-12-25 DIAGNOSIS — I6523 Occlusion and stenosis of bilateral carotid arteries: Secondary | ICD-10-CM | POA: Diagnosis not present

## 2019-12-25 DIAGNOSIS — F900 Attention-deficit hyperactivity disorder, predominantly inattentive type: Secondary | ICD-10-CM | POA: Diagnosis not present

## 2019-12-30 DIAGNOSIS — M25511 Pain in right shoulder: Secondary | ICD-10-CM | POA: Diagnosis not present

## 2019-12-31 DIAGNOSIS — Z1212 Encounter for screening for malignant neoplasm of rectum: Secondary | ICD-10-CM | POA: Diagnosis not present

## 2020-01-05 ENCOUNTER — Other Ambulatory Visit (HOSPITAL_COMMUNITY): Payer: Self-pay

## 2020-01-06 DIAGNOSIS — M25511 Pain in right shoulder: Secondary | ICD-10-CM | POA: Diagnosis not present

## 2020-01-08 DIAGNOSIS — M25511 Pain in right shoulder: Secondary | ICD-10-CM | POA: Diagnosis not present

## 2020-01-09 ENCOUNTER — Other Ambulatory Visit (HOSPITAL_COMMUNITY): Payer: Self-pay | Admitting: Internal Medicine

## 2020-01-12 DIAGNOSIS — Z20822 Contact with and (suspected) exposure to covid-19: Secondary | ICD-10-CM | POA: Diagnosis not present

## 2020-01-13 DIAGNOSIS — M25511 Pain in right shoulder: Secondary | ICD-10-CM | POA: Diagnosis not present

## 2020-01-14 ENCOUNTER — Other Ambulatory Visit (HOSPITAL_COMMUNITY): Payer: Self-pay | Admitting: Internal Medicine

## 2020-01-16 DIAGNOSIS — M25511 Pain in right shoulder: Secondary | ICD-10-CM | POA: Diagnosis not present

## 2020-01-28 ENCOUNTER — Other Ambulatory Visit (HOSPITAL_COMMUNITY): Payer: Self-pay | Admitting: Internal Medicine

## 2020-02-07 ENCOUNTER — Other Ambulatory Visit (HOSPITAL_COMMUNITY): Payer: Self-pay | Admitting: Internal Medicine

## 2020-02-09 ENCOUNTER — Other Ambulatory Visit (HOSPITAL_COMMUNITY): Payer: Self-pay | Admitting: Internal Medicine

## 2020-02-13 DIAGNOSIS — M25511 Pain in right shoulder: Secondary | ICD-10-CM | POA: Diagnosis not present

## 2020-02-18 DIAGNOSIS — M25511 Pain in right shoulder: Secondary | ICD-10-CM | POA: Diagnosis not present

## 2020-03-15 ENCOUNTER — Other Ambulatory Visit (HOSPITAL_COMMUNITY): Payer: Self-pay | Admitting: Internal Medicine

## 2020-03-26 DIAGNOSIS — H6122 Impacted cerumen, left ear: Secondary | ICD-10-CM | POA: Diagnosis not present

## 2020-03-26 DIAGNOSIS — H6121 Impacted cerumen, right ear: Secondary | ICD-10-CM | POA: Diagnosis not present

## 2020-03-30 ENCOUNTER — Other Ambulatory Visit (HOSPITAL_COMMUNITY): Payer: Self-pay | Admitting: Internal Medicine

## 2020-04-15 ENCOUNTER — Other Ambulatory Visit (HOSPITAL_COMMUNITY): Payer: Self-pay | Admitting: Internal Medicine

## 2020-04-30 ENCOUNTER — Other Ambulatory Visit (HOSPITAL_COMMUNITY): Payer: Self-pay | Admitting: Internal Medicine

## 2020-04-30 ENCOUNTER — Other Ambulatory Visit (HOSPITAL_BASED_OUTPATIENT_CLINIC_OR_DEPARTMENT_OTHER): Payer: Self-pay

## 2020-05-10 ENCOUNTER — Other Ambulatory Visit (HOSPITAL_COMMUNITY): Payer: Self-pay

## 2020-05-17 ENCOUNTER — Other Ambulatory Visit (HOSPITAL_COMMUNITY): Payer: Self-pay

## 2020-05-17 MED FILL — Losartan Potassium Tab 50 MG: ORAL | 90 days supply | Qty: 90 | Fill #0 | Status: AC

## 2020-05-18 ENCOUNTER — Other Ambulatory Visit (HOSPITAL_COMMUNITY): Payer: Self-pay

## 2020-05-20 ENCOUNTER — Other Ambulatory Visit (HOSPITAL_COMMUNITY): Payer: Self-pay

## 2020-05-20 DIAGNOSIS — H43813 Vitreous degeneration, bilateral: Secondary | ICD-10-CM | POA: Diagnosis not present

## 2020-05-21 ENCOUNTER — Other Ambulatory Visit (HOSPITAL_COMMUNITY): Payer: Self-pay

## 2020-05-24 ENCOUNTER — Other Ambulatory Visit (HOSPITAL_COMMUNITY): Payer: Self-pay

## 2020-05-24 MED ORDER — TESTOSTERONE CYPIONATE 200 MG/ML IM SOLN
INTRAMUSCULAR | 0 refills | Status: DC
  Filled 2020-05-24: qty 1, 21d supply, fill #0

## 2020-05-24 MED FILL — Tamsulosin HCl Cap 0.4 MG: ORAL | 30 days supply | Qty: 30 | Fill #0 | Status: AC

## 2020-05-25 ENCOUNTER — Other Ambulatory Visit (HOSPITAL_COMMUNITY): Payer: Self-pay

## 2020-06-03 ENCOUNTER — Other Ambulatory Visit (HOSPITAL_COMMUNITY): Payer: Self-pay

## 2020-06-05 ENCOUNTER — Other Ambulatory Visit (HOSPITAL_COMMUNITY): Payer: Self-pay

## 2020-06-07 ENCOUNTER — Other Ambulatory Visit (HOSPITAL_COMMUNITY): Payer: Self-pay

## 2020-06-07 MED ORDER — METHYLPHENIDATE HCL ER 20 MG PO TBCR
EXTENDED_RELEASE_TABLET | ORAL | 0 refills | Status: DC
  Filled 2020-06-07: qty 30, 30d supply, fill #0

## 2020-06-08 ENCOUNTER — Other Ambulatory Visit (HOSPITAL_COMMUNITY): Payer: Self-pay

## 2020-06-20 ENCOUNTER — Other Ambulatory Visit (HOSPITAL_COMMUNITY): Payer: Self-pay

## 2020-06-21 ENCOUNTER — Other Ambulatory Visit (HOSPITAL_COMMUNITY): Payer: Self-pay

## 2020-06-22 ENCOUNTER — Other Ambulatory Visit (HOSPITAL_COMMUNITY): Payer: Self-pay

## 2020-06-22 MED ORDER — AMLODIPINE BESYLATE 5 MG PO TABS
1.0000 | ORAL_TABLET | Freq: Every day | ORAL | 2 refills | Status: DC
  Filled 2020-06-22: qty 90, 90d supply, fill #0
  Filled 2020-09-20: qty 90, 90d supply, fill #1
  Filled 2021-01-02: qty 90, 90d supply, fill #2

## 2020-06-28 MED FILL — Tamsulosin HCl Cap 0.4 MG: ORAL | 30 days supply | Qty: 30 | Fill #1 | Status: AC

## 2020-06-28 MED FILL — Simvastatin Tab 40 MG: ORAL | 90 days supply | Qty: 90 | Fill #0 | Status: AC

## 2020-06-29 ENCOUNTER — Other Ambulatory Visit (HOSPITAL_COMMUNITY): Payer: Self-pay

## 2020-07-03 ENCOUNTER — Other Ambulatory Visit (HOSPITAL_COMMUNITY): Payer: Self-pay

## 2020-07-03 MED FILL — Ezetimibe Tab 10 MG: ORAL | 90 days supply | Qty: 90 | Fill #0 | Status: AC

## 2020-07-07 ENCOUNTER — Other Ambulatory Visit (HOSPITAL_COMMUNITY): Payer: Self-pay

## 2020-07-14 ENCOUNTER — Other Ambulatory Visit (HOSPITAL_COMMUNITY): Payer: Self-pay

## 2020-07-14 MED ORDER — TESTOSTERONE CYPIONATE 200 MG/ML IM SOLN
INTRAMUSCULAR | 0 refills | Status: DC
  Filled 2020-07-14: qty 1, 30d supply, fill #0

## 2020-07-15 ENCOUNTER — Other Ambulatory Visit (HOSPITAL_COMMUNITY): Payer: Self-pay

## 2020-07-16 ENCOUNTER — Other Ambulatory Visit (HOSPITAL_COMMUNITY): Payer: Self-pay

## 2020-07-17 ENCOUNTER — Other Ambulatory Visit (HOSPITAL_COMMUNITY): Payer: Self-pay

## 2020-07-17 MED FILL — Bupropion HCl Tab ER 12HR 100 MG: ORAL | 90 days supply | Qty: 180 | Fill #0 | Status: AC

## 2020-07-19 ENCOUNTER — Other Ambulatory Visit (HOSPITAL_COMMUNITY): Payer: Self-pay

## 2020-07-19 MED ORDER — METHYLPHENIDATE HCL ER 20 MG PO TBCR
EXTENDED_RELEASE_TABLET | ORAL | 0 refills | Status: DC
  Filled 2020-07-19: qty 30, 30d supply, fill #0

## 2020-07-28 ENCOUNTER — Other Ambulatory Visit: Payer: Self-pay | Admitting: Urology

## 2020-07-28 MED FILL — Metoprolol Succinate Tab ER 24HR 25 MG (Tartrate Equiv): ORAL | 90 days supply | Qty: 90 | Fill #0 | Status: AC

## 2020-07-29 ENCOUNTER — Other Ambulatory Visit (HOSPITAL_COMMUNITY): Payer: Self-pay

## 2020-08-02 ENCOUNTER — Other Ambulatory Visit (HOSPITAL_COMMUNITY): Payer: Self-pay

## 2020-08-02 ENCOUNTER — Other Ambulatory Visit: Payer: Self-pay | Admitting: Urology

## 2020-08-02 MED ORDER — TAMSULOSIN HCL 0.4 MG PO CAPS
0.4000 mg | ORAL_CAPSULE | Freq: Every day | ORAL | 3 refills | Status: DC
  Filled 2020-08-02: qty 90, 90d supply, fill #0
  Filled 2020-11-05: qty 90, 90d supply, fill #1
  Filled 2021-02-23: qty 90, 90d supply, fill #2
  Filled 2021-05-24: qty 90, 90d supply, fill #3

## 2020-08-04 ENCOUNTER — Other Ambulatory Visit (HOSPITAL_COMMUNITY): Payer: Self-pay

## 2020-08-16 ENCOUNTER — Other Ambulatory Visit (HOSPITAL_COMMUNITY): Payer: Self-pay

## 2020-08-16 MED ORDER — METHYLPHENIDATE HCL ER 20 MG PO TBCR
EXTENDED_RELEASE_TABLET | ORAL | 0 refills | Status: DC
  Filled 2020-08-16: qty 30, 30d supply, fill #0

## 2020-08-16 MED ORDER — TESTOSTERONE CYPIONATE 200 MG/ML IM SOLN
INTRAMUSCULAR | 0 refills | Status: DC
  Filled 2020-08-16: qty 3, 30d supply, fill #0

## 2020-08-18 ENCOUNTER — Other Ambulatory Visit (HOSPITAL_COMMUNITY): Payer: Self-pay

## 2020-08-23 ENCOUNTER — Other Ambulatory Visit (HOSPITAL_COMMUNITY): Payer: Self-pay

## 2020-08-23 MED FILL — Losartan Potassium Tab 50 MG: ORAL | 90 days supply | Qty: 90 | Fill #1 | Status: AC

## 2020-09-17 ENCOUNTER — Other Ambulatory Visit (HOSPITAL_COMMUNITY): Payer: Self-pay

## 2020-09-17 MED ORDER — METHYLPHENIDATE HCL ER 20 MG PO TBCR
EXTENDED_RELEASE_TABLET | ORAL | 0 refills | Status: DC
  Filled 2020-09-17: qty 30, 30d supply, fill #0

## 2020-09-17 MED ORDER — "BD LUER-LOK SYRINGE 18G X 1-1/2"" 3 ML MISC"
0 refills | Status: AC
  Filled 2020-09-17: qty 20, 30d supply, fill #0

## 2020-09-17 MED FILL — Ezetimibe Tab 10 MG: ORAL | 90 days supply | Qty: 90 | Fill #1 | Status: AC

## 2020-09-20 ENCOUNTER — Other Ambulatory Visit (HOSPITAL_COMMUNITY): Payer: Self-pay

## 2020-09-21 ENCOUNTER — Other Ambulatory Visit (HOSPITAL_COMMUNITY): Payer: Self-pay

## 2020-10-01 ENCOUNTER — Other Ambulatory Visit (HOSPITAL_COMMUNITY): Payer: Self-pay

## 2020-10-04 ENCOUNTER — Other Ambulatory Visit (HOSPITAL_COMMUNITY): Payer: Self-pay

## 2020-10-04 MED ORDER — FAMCICLOVIR 500 MG PO TABS
ORAL_TABLET | ORAL | 0 refills | Status: DC
  Filled 2020-10-04: qty 24, 8d supply, fill #0

## 2020-10-05 ENCOUNTER — Other Ambulatory Visit (HOSPITAL_COMMUNITY): Payer: Self-pay

## 2020-10-06 ENCOUNTER — Other Ambulatory Visit (HOSPITAL_COMMUNITY): Payer: Self-pay

## 2020-10-08 ENCOUNTER — Other Ambulatory Visit (HOSPITAL_COMMUNITY): Payer: Self-pay

## 2020-10-11 ENCOUNTER — Other Ambulatory Visit (HOSPITAL_COMMUNITY): Payer: Self-pay

## 2020-10-12 ENCOUNTER — Other Ambulatory Visit (HOSPITAL_COMMUNITY): Payer: Self-pay

## 2020-10-13 ENCOUNTER — Other Ambulatory Visit (HOSPITAL_COMMUNITY): Payer: Self-pay

## 2020-10-13 MED ORDER — SIMVASTATIN 40 MG PO TABS
ORAL_TABLET | ORAL | 3 refills | Status: DC
  Filled 2020-10-13: qty 90, 90d supply, fill #0
  Filled 2021-01-31: qty 90, 90d supply, fill #1
  Filled 2021-04-26: qty 90, 90d supply, fill #2
  Filled 2021-08-16: qty 90, 90d supply, fill #3

## 2020-10-14 ENCOUNTER — Other Ambulatory Visit (HOSPITAL_COMMUNITY): Payer: Self-pay

## 2020-10-14 MED ORDER — TESTOSTERONE CYPIONATE 200 MG/ML IM SOLN
INTRAMUSCULAR | 2 refills | Status: DC
  Filled 2020-10-14: qty 1, 21d supply, fill #0
  Filled 2021-02-02: qty 1, 21d supply, fill #1
  Filled 2021-02-18: qty 1, 21d supply, fill #2

## 2020-10-14 MED ORDER — FAMCICLOVIR 125 MG PO TABS
125.0000 mg | ORAL_TABLET | Freq: Two times a day (BID) | ORAL | 5 refills | Status: DC | PRN
  Filled 2020-10-14: qty 30, 15d supply, fill #0
  Filled 2021-06-29: qty 30, 15d supply, fill #1

## 2020-10-15 ENCOUNTER — Other Ambulatory Visit (HOSPITAL_COMMUNITY): Payer: Self-pay

## 2020-10-15 MED ORDER — METHYLPHENIDATE HCL ER 20 MG PO TBCR
EXTENDED_RELEASE_TABLET | ORAL | 0 refills | Status: DC
  Filled 2020-10-15: qty 30, 30d supply, fill #0

## 2020-10-21 MED FILL — Bupropion HCl Tab ER 12HR 100 MG: ORAL | 90 days supply | Qty: 180 | Fill #1 | Status: AC

## 2020-10-22 ENCOUNTER — Other Ambulatory Visit (HOSPITAL_COMMUNITY): Payer: Self-pay

## 2020-11-05 ENCOUNTER — Other Ambulatory Visit (HOSPITAL_COMMUNITY): Payer: Self-pay

## 2020-11-11 ENCOUNTER — Other Ambulatory Visit (HOSPITAL_COMMUNITY): Payer: Self-pay

## 2020-11-12 ENCOUNTER — Other Ambulatory Visit (HOSPITAL_COMMUNITY): Payer: Self-pay

## 2020-11-12 MED ORDER — TESTOSTERONE CYPIONATE 200 MG/ML IM SOLN
INTRAMUSCULAR | 3 refills | Status: DC
  Filled 2020-11-12: qty 3, 63d supply, fill #0

## 2020-11-12 MED ORDER — METHYLPHENIDATE HCL ER 20 MG PO TBCR
EXTENDED_RELEASE_TABLET | ORAL | 0 refills | Status: DC
  Filled 2020-11-12: qty 30, 30d supply, fill #0

## 2020-11-13 ENCOUNTER — Other Ambulatory Visit (HOSPITAL_COMMUNITY): Payer: Self-pay

## 2020-11-13 MED FILL — Metoprolol Succinate Tab ER 24HR 25 MG (Tartrate Equiv): ORAL | 90 days supply | Qty: 90 | Fill #1 | Status: AC

## 2020-11-15 ENCOUNTER — Other Ambulatory Visit (HOSPITAL_COMMUNITY): Payer: Self-pay

## 2020-11-25 ENCOUNTER — Other Ambulatory Visit (HOSPITAL_COMMUNITY): Payer: Self-pay

## 2020-11-25 MED ORDER — GABAPENTIN 300 MG PO CAPS
ORAL_CAPSULE | ORAL | 3 refills | Status: DC
  Filled 2020-11-25: qty 30, 30d supply, fill #0

## 2020-11-26 ENCOUNTER — Other Ambulatory Visit (HOSPITAL_COMMUNITY): Payer: Self-pay

## 2020-11-26 MED FILL — Losartan Potassium Tab 50 MG: ORAL | 90 days supply | Qty: 90 | Fill #2 | Status: AC

## 2020-12-20 ENCOUNTER — Other Ambulatory Visit: Payer: Self-pay | Admitting: Urology

## 2020-12-20 ENCOUNTER — Other Ambulatory Visit (HOSPITAL_COMMUNITY): Payer: Self-pay

## 2020-12-21 ENCOUNTER — Other Ambulatory Visit (HOSPITAL_COMMUNITY): Payer: Self-pay

## 2020-12-21 MED ORDER — METHYLPHENIDATE HCL ER 20 MG PO TBCR
EXTENDED_RELEASE_TABLET | ORAL | 0 refills | Status: DC
  Filled 2020-12-21: qty 30, 30d supply, fill #0

## 2020-12-22 ENCOUNTER — Other Ambulatory Visit (HOSPITAL_COMMUNITY): Payer: Self-pay

## 2021-01-02 ENCOUNTER — Other Ambulatory Visit (HOSPITAL_COMMUNITY): Payer: Self-pay

## 2021-01-02 MED FILL — Ezetimibe Tab 10 MG: ORAL | 90 days supply | Qty: 90 | Fill #2 | Status: AC

## 2021-01-03 ENCOUNTER — Other Ambulatory Visit (HOSPITAL_COMMUNITY): Payer: Self-pay

## 2021-01-13 ENCOUNTER — Other Ambulatory Visit (HOSPITAL_COMMUNITY): Payer: Self-pay

## 2021-01-14 ENCOUNTER — Other Ambulatory Visit (HOSPITAL_COMMUNITY): Payer: Self-pay

## 2021-01-14 MED ORDER — AMLODIPINE BESYLATE 5 MG PO TABS
5.0000 mg | ORAL_TABLET | Freq: Every day | ORAL | 2 refills | Status: DC
  Filled 2021-01-14 – 2021-04-11 (×2): qty 90, 90d supply, fill #0
  Filled 2021-07-25: qty 90, 90d supply, fill #1

## 2021-01-18 DIAGNOSIS — R82998 Other abnormal findings in urine: Secondary | ICD-10-CM | POA: Diagnosis not present

## 2021-01-19 ENCOUNTER — Other Ambulatory Visit (HOSPITAL_COMMUNITY): Payer: Self-pay

## 2021-01-19 MED ORDER — BUPROPION HCL ER (SR) 150 MG PO TB12
150.0000 mg | ORAL_TABLET | Freq: Every morning | ORAL | 3 refills | Status: DC
  Filled 2021-01-19: qty 90, 90d supply, fill #0
  Filled 2021-06-15: qty 90, 90d supply, fill #1

## 2021-01-19 MED ORDER — ASPIRIN EC 81 MG PO TBEC
81.0000 mg | DELAYED_RELEASE_TABLET | Freq: Every day | ORAL | 3 refills | Status: DC
  Filled 2021-01-19: qty 90, 90d supply, fill #0

## 2021-01-24 ENCOUNTER — Other Ambulatory Visit (HOSPITAL_COMMUNITY): Payer: Self-pay

## 2021-01-24 MED ORDER — METHYLPHENIDATE HCL ER 20 MG PO TBCR
EXTENDED_RELEASE_TABLET | ORAL | 0 refills | Status: DC
  Filled 2021-01-24: qty 30, 30d supply, fill #0

## 2021-01-26 ENCOUNTER — Other Ambulatory Visit (HOSPITAL_COMMUNITY): Payer: Self-pay

## 2021-01-31 ENCOUNTER — Other Ambulatory Visit (HOSPITAL_COMMUNITY): Payer: Self-pay

## 2021-02-01 ENCOUNTER — Other Ambulatory Visit (HOSPITAL_COMMUNITY): Payer: Self-pay

## 2021-02-02 ENCOUNTER — Other Ambulatory Visit (HOSPITAL_COMMUNITY): Payer: Self-pay

## 2021-02-04 ENCOUNTER — Other Ambulatory Visit (HOSPITAL_COMMUNITY): Payer: Self-pay

## 2021-02-04 MED ORDER — EZETIMIBE 10 MG PO TABS
10.0000 mg | ORAL_TABLET | Freq: Every day | ORAL | 3 refills | Status: DC
  Filled 2021-02-04: qty 90, 90d supply, fill #0

## 2021-02-04 MED ORDER — EZETIMIBE 10 MG PO TABS
ORAL_TABLET | ORAL | 4 refills | Status: DC
Start: 2021-02-04 — End: 2021-03-16
  Filled 2021-02-04: qty 90, 90d supply, fill #0

## 2021-02-14 ENCOUNTER — Encounter: Payer: Self-pay | Admitting: Internal Medicine

## 2021-02-16 ENCOUNTER — Other Ambulatory Visit (HOSPITAL_COMMUNITY): Payer: Self-pay

## 2021-02-16 MED ORDER — BUPROPION HCL ER (SR) 150 MG PO TB12
ORAL_TABLET | ORAL | 3 refills | Status: DC
  Filled 2021-02-16: qty 180, 90d supply, fill #0
  Filled 2021-06-15: qty 180, 90d supply, fill #1
  Filled 2021-09-18: qty 180, 90d supply, fill #2

## 2021-02-18 ENCOUNTER — Other Ambulatory Visit (HOSPITAL_COMMUNITY): Payer: Self-pay

## 2021-02-19 ENCOUNTER — Other Ambulatory Visit (HOSPITAL_COMMUNITY): Payer: Self-pay

## 2021-02-23 ENCOUNTER — Other Ambulatory Visit (HOSPITAL_COMMUNITY): Payer: Self-pay

## 2021-02-23 MED ORDER — METHYLPHENIDATE HCL ER 20 MG PO TBCR
EXTENDED_RELEASE_TABLET | ORAL | 0 refills | Status: DC
  Filled 2021-02-23: qty 30, 30d supply, fill #0

## 2021-02-23 MED FILL — Losartan Potassium Tab 50 MG: ORAL | 60 days supply | Qty: 60 | Fill #3 | Status: AC

## 2021-02-24 ENCOUNTER — Other Ambulatory Visit (HOSPITAL_COMMUNITY): Payer: Self-pay

## 2021-02-24 DIAGNOSIS — M170 Bilateral primary osteoarthritis of knee: Secondary | ICD-10-CM | POA: Diagnosis not present

## 2021-02-25 ENCOUNTER — Other Ambulatory Visit (HOSPITAL_COMMUNITY): Payer: Self-pay

## 2021-03-03 ENCOUNTER — Other Ambulatory Visit (HOSPITAL_COMMUNITY): Payer: Self-pay

## 2021-03-03 MED ORDER — METOPROLOL SUCCINATE ER 25 MG PO TB24
25.0000 mg | ORAL_TABLET | Freq: Every day | ORAL | 3 refills | Status: DC
  Filled 2021-03-03: qty 90, 90d supply, fill #0
  Filled 2021-05-24: qty 90, 90d supply, fill #1
  Filled 2021-06-01 – 2021-09-11 (×3): qty 90, 90d supply, fill #2
  Filled 2022-01-14: qty 90, 90d supply, fill #3

## 2021-03-04 ENCOUNTER — Other Ambulatory Visit (HOSPITAL_COMMUNITY): Payer: Self-pay

## 2021-03-16 ENCOUNTER — Ambulatory Visit (INDEPENDENT_AMBULATORY_CARE_PROVIDER_SITE_OTHER): Payer: Medicare Other | Admitting: Internal Medicine

## 2021-03-16 ENCOUNTER — Encounter: Payer: Self-pay | Admitting: Internal Medicine

## 2021-03-16 VITALS — BP 100/60 | HR 64 | Ht 64.5 in | Wt 146.5 lb

## 2021-03-16 DIAGNOSIS — K219 Gastro-esophageal reflux disease without esophagitis: Secondary | ICD-10-CM

## 2021-03-16 DIAGNOSIS — R195 Other fecal abnormalities: Secondary | ICD-10-CM

## 2021-03-16 MED ORDER — SUTAB 1479-225-188 MG PO TABS: 1.0000 | ORAL_TABLET | Freq: Once | ORAL | 0 refills | Status: AC

## 2021-03-16 NOTE — Patient Instructions (Signed)
If you are age 69 or older, your body mass index should be between 23-30. Your Body mass index is 24.76 kg/m. If this is out of the aforementioned range listed, please consider follow up with your Primary Care Provider.  If you are age 83 or younger, your body mass index should be between 19-25. Your Body mass index is 24.76 kg/m. If this is out of the aformentioned range listed, please consider follow up with your Primary Care Provider.   ________________________________________________________  The [facility] providers would like to encourage you to use [facility] to communicate with providers for non-urgent requests or questions.  Due to long hold times on the telephone, sending your provider a message by [facility] may be a faster and more efficient way to get a response.  Please allow 48 business hours for a response.  Please remember that this is for non-urgent requests.  _______________________________________________________  Joshua [name] have been scheduled for an endoscopy and colonoscopy. Please follow the written instructions given to you at your visit today. Please pick up your prep supplies at the pharmacy within the next 1-3 days. If you use inhalers (even only as needed), please bring them with you on the day of your procedure.

## 2021-03-16 NOTE — Progress Notes (Signed)
HISTORY OF PRESENT ILLNESS:  Joshua [name] is a 71 y.o. male, retired Chief Financial Officer and native of [place], who is sent today by his primary care provider, Dr. [name], regarding Hemoccult positive stool on routine annual testing.  I saw the patient May 2009 when he underwent routine screening colonoscopy.  The examination was normal.  He has not been seen since.  He did undergo Hemoccult testing November 2021.  This was negative.  Hemoglobin at that time was 16.7.  More recent Hemoccult testing December 2022 returned positive.  Hemoglobin at that time was 16.5.  The patient's GI review of systems is remarkable for reflux symptoms for which she takes Pepcid once per week.  He also takes a daily aspirin and Aleve as needed for aches and pains.  He denies melena or hematochezia.  No abdominal pain.  No change in bowel habits.  No family history of colon cancer.  REVIEW OF SYSTEMS:  All non-GI ROS negative unless otherwise stated in the HPI except for excessive urination  Past Medical History:  Diagnosis Date   Anemia    as a child   Anxiety    Arthritis    Depression    takes Wellbutrin daily   Hyperlipidemia    takes Simvastatin daily   Hypertension    takes Metoprolol and Hyzaar daily    Muscle spasm    takes Robaxin as needed   Nerve pain    takes Gabapentin as needed   OSA (obstructive sleep apnea) 12/01/2013   16.6 AHI and RDI of over 30. REM AHI over 30.     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 and states it is very well controlled   Urinary frequency    Weakness    numbness and tingling in right arm down to fingers    Past Surgical History:  Procedure Laterality Date   ANKLE ARTHROPLASTY Right    ANTERIOR CERVICAL DECOMP/DISCECTOMY FUSION N/A 06/28/2016   Procedure: ANTERIOR CERVICAL DECOMPRESSION FUSION CERVICAL THREE-FOUR ,CERVICAL FOUR-FIVE,CERVICAL FIVE-SIX;  Surgeon: Jovita Gamma, MD;  Location: [facility];  Service: Neurosurgery;  Laterality: N/A;   APPENDECTOMY      COLONOSCOPY     lap inguinal hernia repair wiht mesh Bilateral 10/2003   REFRACTIVE SURGERY  2003   TONSILLECTOMY     wisdom teeth extracted      Social History Joshua [name]  reports that he has never smoked. He has never used smokeless tobacco. He reports current alcohol use. He reports that he does not use drugs.  family history includes Hyperlipidemia in his brother; Hypertension in his brother, father, and sister; Lung disease in his father; Lymphoma in his mother; Parkinson's disease in his father; Stroke in his maternal grandmother; Ulcerative colitis in his daughter.  Allergies  Allergen Reactions   No Known Allergies        PHYSICAL EXAMINATION: Vital signs: BP 100/60 (BP Location: Left Arm, Patient Position: Sitting, Cuff Size: Normal)    [name] 66    Ht 5' 4.5" (1.638 m) Comment: height measured without shoes   Wt 146 lb 8 oz (66.5 kg)    BMI 24.76 kg/m   Constitutional: generally well-appearing, no acute distress Psychiatric: alert and oriented x3, cooperative Eyes: extraocular movements intact, anicteric, conjunctiva pink Mouth: oral pharynx moist, no lesions Neck: supple no lymphadenopathy Cardiovascular: heart regular rate and rhythm, no murmur Lungs: clear to auscultation bilaterally Abdomen: soft, nontender, nondistended, no obvious ascites, no peritoneal signs, normal bowel sounds, no organomegaly Rectal: Deferred  until colonoscopy Extremities: no clubbing, cyanosis, or lower extremity edema bilaterally Skin: no lesions on visible extremities Neuro: No focal deficits.  Cranial nerves intact  ASSESSMENT:  1.  Hemoccult positive stool.  Normal hemoglobin.  Rule out underlying GI mucosal lesion 2.  GERD.  No alarm features. 3.  Chronic aspirin and intermittent NSAID use. 4.  Normal colonoscopy 2009   PLAN:  1.  Colonoscopy to evaluate Hemoccult-positive stool.The nature of the procedure, as well as the risks, benefits, and alternatives were carefully and  thoroughly reviewed with the patient. Ample time for discussion and questions allowed. The patient understood, was satisfied, and agreed to proceed.  2.  Upper endoscopy to evaluate Hemoccult-positive stool, chronic GERD, and rule out NSAID induced mucosal lesions.The nature of the procedure, as well as the risks, benefits, and alternatives were carefully and thoroughly reviewed with the patient. Ample time for discussion and questions allowed. The patient understood, was satisfied, and agreed to proceed.  3.  Further recommendations after the above completed

## 2021-03-23 ENCOUNTER — Other Ambulatory Visit (HOSPITAL_COMMUNITY): Payer: Self-pay

## 2021-03-23 MED ORDER — TESTOSTERONE CYPIONATE 200 MG/ML IM SOLN
INTRAMUSCULAR | 0 refills | Status: DC
  Filled 2021-03-23: qty 3, 63d supply, fill #0

## 2021-03-26 ENCOUNTER — Other Ambulatory Visit (HOSPITAL_COMMUNITY): Payer: Self-pay

## 2021-04-04 ENCOUNTER — Other Ambulatory Visit (HOSPITAL_COMMUNITY): Payer: Self-pay

## 2021-04-04 MED ORDER — METHYLPHENIDATE HCL ER 20 MG PO TBCR
EXTENDED_RELEASE_TABLET | ORAL | 0 refills | Status: DC
  Filled 2021-04-04: qty 30, 30d supply, fill #0

## 2021-04-11 ENCOUNTER — Other Ambulatory Visit (HOSPITAL_COMMUNITY): Payer: Self-pay

## 2021-04-12 ENCOUNTER — Other Ambulatory Visit (HOSPITAL_COMMUNITY): Payer: Self-pay

## 2021-04-14 ENCOUNTER — Ambulatory Visit: Payer: Medicare Other | Admitting: Neurology

## 2021-04-19 ENCOUNTER — Other Ambulatory Visit (HOSPITAL_COMMUNITY): Payer: Self-pay

## 2021-04-19 MED ORDER — FAMOTIDINE 20 MG PO TABS
ORAL_TABLET | ORAL | 4 refills | Status: AC
Start: 2021-04-19 — End: ?
  Filled 2021-04-19: qty 90, 90d supply, fill #0

## 2021-04-20 ENCOUNTER — Other Ambulatory Visit (HOSPITAL_COMMUNITY): Payer: Self-pay

## 2021-04-26 ENCOUNTER — Other Ambulatory Visit (HOSPITAL_COMMUNITY): Payer: Self-pay

## 2021-04-27 ENCOUNTER — Other Ambulatory Visit (HOSPITAL_COMMUNITY): Payer: Self-pay

## 2021-04-27 MED ORDER — LOSARTAN POTASSIUM 50 MG PO TABS
50.0000 mg | ORAL_TABLET | Freq: Every day | ORAL | 3 refills | Status: DC
  Filled 2021-04-27: qty 90, 90d supply, fill #0
  Filled 2021-08-04: qty 90, 90d supply, fill #1
  Filled 2021-11-19: qty 90, 90d supply, fill #2

## 2021-04-27 MED ORDER — EZETIMIBE 10 MG PO TABS
10.0000 mg | ORAL_TABLET | Freq: Every day | ORAL | 3 refills | Status: DC
  Filled 2021-04-27: qty 90, 90d supply, fill #0

## 2021-05-06 ENCOUNTER — Other Ambulatory Visit (HOSPITAL_COMMUNITY): Payer: Self-pay

## 2021-05-09 ENCOUNTER — Other Ambulatory Visit (HOSPITAL_COMMUNITY): Payer: Self-pay

## 2021-05-09 MED ORDER — TESTOSTERONE CYPIONATE 200 MG/ML IM SOLN
INTRAMUSCULAR | 0 refills | Status: DC
  Filled 2021-05-20: qty 3, 63d supply, fill #0

## 2021-05-10 ENCOUNTER — Encounter: Payer: Self-pay | Admitting: Internal Medicine

## 2021-05-10 ENCOUNTER — Other Ambulatory Visit (HOSPITAL_COMMUNITY): Payer: Self-pay

## 2021-05-10 ENCOUNTER — Ambulatory Visit (AMBULATORY_SURGERY_CENTER): Payer: Medicare Other | Admitting: Internal Medicine

## 2021-05-10 VITALS — BP 120/60 | HR 54 | Temp 97.3°F | Resp 14 | Ht 64.0 in | Wt 146.0 lb

## 2021-05-10 DIAGNOSIS — G4733 Obstructive sleep apnea (adult) (pediatric): Secondary | ICD-10-CM | POA: Diagnosis not present

## 2021-05-10 DIAGNOSIS — R195 Other fecal abnormalities: Secondary | ICD-10-CM

## 2021-05-10 DIAGNOSIS — D122 Benign neoplasm of ascending colon: Secondary | ICD-10-CM | POA: Diagnosis not present

## 2021-05-10 DIAGNOSIS — I10 Essential (primary) hypertension: Secondary | ICD-10-CM | POA: Diagnosis not present

## 2021-05-10 DIAGNOSIS — D120 Benign neoplasm of cecum: Secondary | ICD-10-CM | POA: Diagnosis not present

## 2021-05-10 DIAGNOSIS — K219 Gastro-esophageal reflux disease without esophagitis: Secondary | ICD-10-CM

## 2021-05-10 DIAGNOSIS — K635 Polyp of colon: Secondary | ICD-10-CM | POA: Diagnosis not present

## 2021-05-10 MED ORDER — SODIUM CHLORIDE 0.9 % IV SOLN: 500.0000 mL | Freq: Once | INTRAVENOUS | Status: DC

## 2021-05-10 NOTE — Patient Instructions (Addendum)
Handouts given. ? ?Dr Henrene Pastor recommends over the counter Prilosec (omeprazole) '20mg'$  daily. This will control your reflux symptoms and prevent ongoing inflammation of the esophagus.  ? ?Resume previous diet and medications. ? ? ?YOU HAD AN ENDOSCOPIC PROCEDURE TODAY AT [facility]:   Refer to the procedure report that was given to you for any specific questions about what was found during the examination.  If the procedure report does not answer your questions, please call your gastroenterologist to clarify.  If you requested that your care partner not be given the details of your procedure findings, then the procedure report has been included in a sealed envelope for you to review at your convenience later. ? ?YOU SHOULD EXPECT: Some feelings of bloating in the abdomen. Passage of more gas than usual.  Walking can help get rid of the air that was put into your GI tract during the procedure and reduce the bloating. If you had a lower endoscopy (such as a colonoscopy or flexible sigmoidoscopy) you may notice spotting of blood in your stool or on the toilet paper. If you underwent a bowel prep for your procedure, you may not have a normal bowel movement for a few days. ? ?Please Note:  You might notice some irritation and congestion in your nose or some drainage.  This is from the oxygen used during your procedure.  There is no need for concern and it should clear up in a day or so. ? ?SYMPTOMS TO REPORT IMMEDIATELY: ? ?Following lower endoscopy (colonoscopy or flexible sigmoidoscopy): ? Excessive amounts of blood in the stool ? Significant tenderness or worsening of abdominal pains ? Swelling of the abdomen that is new, acute ? Fever of 100?F or higher ? ?Following upper endoscopy (EGD) ? Vomiting of blood or coffee ground material ? New chest pain or pain under the shoulder blades ? Painful or persistently difficult swallowing ? New shortness of breath ? Fever of 100?F or higher ? Black, tarry-looking  stools ? ?For urgent or emergent issues, a gastroenterologist can be reached at any hour by calling [phone]. ?Do not use MyChart messaging for urgent concerns.  ? ? ?DIET:  We do recommend a small meal at first, but then you may proceed to your regular diet.  Drink plenty of fluids but you should avoid alcoholic beverages for 24 hours. ? ?ACTIVITY:  You should plan to take it easy for the rest of today and you should NOT DRIVE or use heavy machinery until tomorrow (because of the sedation medicines used during the test).   ? ?FOLLOW UP: ?Our staff will call the number listed on your records 48-72 hours following your procedure to check on you and address any questions or concerns that you may have regarding the information given to you following your procedure. If we do not reach you, we will leave a message.  We will attempt to reach you two times.  During this call, we will ask if you have developed any symptoms of COVID 19. If you develop any symptoms (ie: fever, flu-like symptoms, shortness of breath, cough etc.) before then, please call [phone].  If you test positive for Covid 19 in the 2 weeks post procedure, please call and report this information to [place].   ? ?If any biopsies were taken you will be contacted by phone or by letter within the next 1-3 weeks.  Please call us at [phone] if you have not heard about the biopsies in 3 weeks.  ? ? ?  SIGNATURES/CONFIDENTIALITY: ?You and/or your care partner have signed paperwork which will be entered into your electronic medical record.  These signatures attest to the fact that that the information above on your After Visit Summary has been reviewed and is understood.  Full responsibility of the confidentiality of this discharge information lies with you and/or your care-partner. YOU HAD AN ENDOSCOPIC PROCEDURE TODAY AT [facility]:   Refer to the procedure report that was given to you for any specific questions about what was found  during the examination.  If the procedure report does not answer your questions, please call your gastroenterologist to clarify.  If you requested that your care partner not be given the details of your procedure findings, then the procedure report has been included in a sealed envelope for you to review at your convenience later. ? ?YOU SHOULD EXPECT: Some feelings of bloating in the abdomen. Passage of more gas than usual.  Walking can help get rid of the air that was put into your GI tract during the procedure and reduce the bloating. If you had a lower endoscopy (such as a colonoscopy or flexible sigmoidoscopy) you may notice spotting of blood in your stool or on the toilet paper. If you underwent a bowel prep for your procedure, you may not have a normal bowel movement for a few days. ? ?Please Note:  You might notice some irritation and congestion in your nose or some drainage.  This is from the oxygen used during your procedure.  There is no need for concern and it should clear up in a day or so. ? ?SYMPTOMS TO REPORT IMMEDIATELY: ? ?Following lower endoscopy (colonoscopy or flexible sigmoidoscopy): ? Excessive amounts of blood in the stool ? Significant tenderness or worsening of abdominal pains ? Swelling of the abdomen that is new, acute ? Fever of 100?F or higher ? ?Following upper endoscopy (EGD) ? Vomiting of blood or coffee ground material ? New chest pain or pain under the shoulder blades ? Painful or persistently difficult swallowing ? New shortness of breath ? Fever of 100?F or higher ? Black, tarry-looking stools ? ?For urgent or emergent issues, a gastroenterologist can be reached at any hour by calling [phone]. ?Do not use MyChart messaging for urgent concerns.  ? ? ?DIET:  We do recommend a small meal at first, but then you may proceed to your regular diet.  Drink plenty of fluids but you should avoid alcoholic beverages for 24 hours. ? ?ACTIVITY:  You should plan to take it easy for the  rest of today and you should NOT DRIVE or use heavy machinery until tomorrow (because of the sedation medicines used during the test).   ? ?FOLLOW UP: ?Our staff will call the number listed on your records 48-72 hours following your procedure to check on you and address any questions or concerns that you may have regarding the information given to you following your procedure. If we do not reach you, we will leave a message.  We will attempt to reach you two times.  During this call, we will ask if you have developed any symptoms of COVID 19. If you develop any symptoms (ie: fever, flu-like symptoms, shortness of breath, cough etc.) before then, please call [phone].  If you test positive for Covid 19 in the 2 weeks post procedure, please call and report this information to [place].   ? ?If any biopsies were taken you will be contacted by phone or by letter within  the next 1-3 weeks.  Please call us at [phone] if you have not heard about the biopsies in 3 weeks.  ? ? ?SIGNATURES/CONFIDENTIALITY: ?You and/or your care partner have signed paperwork which will be entered into your electronic medical record.  These signatures attest to the fact that that the information above on your After Visit Summary has been reviewed and is understood.  Full responsibility of the confidentiality of this discharge information lies with you and/or your care-partner.  ?

## 2021-05-10 NOTE — Op Note (Signed)
[facility] ?Patient Name: Joshua [name] ?Procedure Date: 05/10/2021 8:43 AM ?MRN: [id number] ?Endoscopist: Docia Chuck. Henrene Pastor , MD ?Age: 71 ?Referring MD:  ?Date of Birth: 16-Sep-1950 ?Gender: Male ?Account #: [id number] ?Procedure:                Upper GI endoscopy ?Indications:              Esophageal reflux, Heme positive stool ?Medicines:                Monitored Anesthesia Care ?Procedure:                Pre-Anesthesia Assessment: ?                          - Prior to the procedure, a History and Physical  ?                          was performed, and patient medications and  ?                          allergies were reviewed. The patient's tolerance of  ?                          previous anesthesia was also reviewed. The risks  ?                          and benefits of the procedure and the sedation  ?                          options and risks were discussed with the patient.  ?                          All questions were answered, and informed consent  ?                          was obtained. Prior Anticoagulants: The patient has  ?                          taken no previous anticoagulant or antiplatelet  ?                          agents. ASA Grade Assessment: II - A patient with  ?                          mild systemic disease. After reviewing the risks  ?                          and benefits, the patient was deemed in  ?                          satisfactory condition to undergo the procedure. ?                          After obtaining informed consent, the endoscope was  ?  passed under direct vision. Throughout the  ?                          procedure, the patient's blood pressure, pulse, and  ?                          oxygen saturations were monitored continuously. The  ?                          Endoscope was introduced through the mouth, and  ?                          advanced to the second part of duodenum. The upper  ?                          GI endoscopy was  accomplished without difficulty.  ?                          The patient tolerated the procedure well. ?Scope In: 8:53:42 AM ?Scope Out: 9:09:08 AM ?Scope Withdrawal Time: 0 hours 12 minutes 53 seconds  ?Total Procedure Duration: 0 hours 15 minutes 26 seconds  ?Findings:                 The esophagus revealed mild esophagitis in the  ?                          region of the gastroesophageal junction. No  ?                          Barrett's. ?                          The stomach was normal save moderate hiatal hernia. ?                          The examined duodenum was normal. ?                          The cardia and gastric fundus were normal on  ?                          retroflexion. ?Complications:            No immediate complications. ?Estimated Blood Loss:     Estimated blood loss: none. ?Impression:               1. GERD with esophagitis ?                          2. Hiatal hernia. Otherwise normal EGD. ?Recommendation:           - Patient has a contact number available for  ?                          emergencies. The signs and symptoms of potential  ?  delayed complications were discussed with the  ?                          patient. Return to normal activities tomorrow.  ?                          Written discharge instructions were provided to the  ?                          patient. ?                          - Resume previous diet. ?                          - Continue present medications. ?                          - Recommend Prilosec (omeprazole) OTC 20 mg daily.  ?                          This will control your reflux symptoms and prevent  ?                          ongoing inflammation of the esophagus ?Docia Chuck. Henrene Pastor, MD ?05/10/2021 9:33:18 AM ?This report has been signed electronically. ?

## 2021-05-10 NOTE — Progress Notes (Signed)
Report to PACU, RN, vss, BBS= Clear.  

## 2021-05-10 NOTE — Progress Notes (Signed)
Pt's states no medical or surgical changes since previsit or office visit. 

## 2021-05-10 NOTE — Progress Notes (Signed)
Called to room to assist during endoscopic procedure.  Patient ID and intended procedure confirmed with present staff. Received instructions for my participation in the procedure from the performing physician.  

## 2021-05-10 NOTE — Op Note (Signed)
[facility] ?Patient Name: Joshua [name] ?Procedure Date: 05/10/2021 8:34 AM ?MRN: [id number] ?Endoscopist: Docia Chuck. Henrene Pastor , MD ?Age: 71 ?Referring MD:  ?Date of Birth: [birth year] ?Gender: Male ?Account #: [id number] ?Procedure:                Colonoscopy with cold snare polypectomy x 1; biopsy  ?                          polypectomy x 1 ?Indications:              Heme positive stool ?Medicines:                Monitored Anesthesia Care ?Procedure:                Pre-Anesthesia Assessment: ?                          - Prior to the procedure, a History and Physical  ?                          was performed, and patient medications and  ?                          allergies were reviewed. The patient's tolerance of  ?                          previous anesthesia was also reviewed. The risks  ?                          and benefits of the procedure and the sedation  ?                          options and risks were discussed with the patient.  ?                          All questions were answered, and informed consent  ?                          was obtained. Prior Anticoagulants: The patient has  ?                          taken no previous anticoagulant or antiplatelet  ?                          agents. ASA Grade Assessment: II - A patient with  ?                          mild systemic disease. After reviewing the risks  ?                          and benefits, the patient was deemed in  ?                          satisfactory condition to undergo the procedure. ?  After obtaining informed consent, the colonoscope  ?                          was passed under direct vision. Throughout the  ?                          procedure, the patient's blood pressure, pulse, and  ?                          oxygen saturations were monitored continuously. The  ?                          CF [id number] #[id number] was introduced through the anus  ?                          and advanced to the the cecum,  identified by  ?                          appendiceal orifice and ileocecal valve. The  ?                          ileocecal valve, appendiceal orifice, and rectum  ?                          were photographed. The quality of the bowel  ?                          preparation was excellent. The colonoscopy was  ?                          performed without difficulty. The patient tolerated  ?                          the procedure well. The bowel preparation used was  ?                          SUPREP via split dose instruction. ?Scope In: ?Scope Out: ?Findings:                 A 5 mm polyp was found in the cecum. The polyp was  ?                          removed with a cold snare. Resection and retrieval  ?                          were complete. ?                          A 1 mm polyp was found in the ascending colon. The  ?                          polyp was removed with a jumbo cold forceps.  ?  Resection and retrieval were complete. ?                          Multiple diverticula were found in the sigmoid  ?                          colon. ?                          Internal hemorrhoids were found during retroflexion. ?                          The exam was otherwise without abnormality on  ?                          direct and retroflexion views. ?Complications:            No immediate complications. Estimated blood loss:  ?                          None. ?Estimated Blood Loss:     Estimated blood loss: none. ?Impression:               - One 5 mm polyp in the cecum, removed with a cold  ?                          snare. Resected and retrieved. ?                          - One 1 mm polyp in the ascending colon, removed  ?                          with a jumbo cold forceps. Resected and retrieved. ?                          - Diverticulosis in the sigmoid colon. ?                          - Internal hemorrhoids. ?                          - The examination was otherwise normal on direct  ?                           and retroflexion views. ?Recommendation:           - Repeat colonoscopy in 7-10 years for surveillance. ?                          - Patient has a contact number available for  ?                          emergencies. The signs and symptoms of potential  ?                          delayed complications were discussed with the  ?  patient. Return to normal activities tomorrow.  ?                          Written discharge instructions were provided to the  ?                          patient. ?                          - Resume previous diet. ?                          - Continue present medications. ?                          - Await pathology results. ?Docia Chuck. Henrene Pastor, MD ?05/10/2021 9:30:23 AM ?This report has been signed electronically. ?

## 2021-05-10 NOTE — Progress Notes (Signed)
HISTORY OF PRESENT ILLNESS: ?  ?Joshua [name] is a 71 y.o. male, retired Chief Financial Officer and native of [place], who is sent today by his primary care provider, Dr. [name], regarding Hemoccult positive stool on routine annual testing.  I saw the patient May 2009 when he underwent routine screening colonoscopy.  The examination was normal.  He has not been seen since.  He did undergo Hemoccult testing November 2021.  This was negative.  Hemoglobin at that time was 16.7.  More recent Hemoccult testing December 2022 returned positive.  Hemoglobin at that time was 16.5.  The patient's GI review of systems is remarkable for reflux symptoms for which she takes Pepcid once per week.  He also takes a daily aspirin and Aleve as needed for aches and pains.  He denies melena or hematochezia.  No abdominal pain.  No change in bowel habits.  No family history of colon cancer. ?  ?REVIEW OF SYSTEMS: ?  ?All non-GI ROS negative unless otherwise stated in the HPI except for excessive urination ?  ?    ?Past Medical History:  ?Diagnosis Date  ? Anemia    ?  as a child  ? Anxiety    ? Arthritis    ? Depression    ?  takes Wellbutrin daily  ? Hyperlipidemia    ?  takes Simvastatin daily  ? Hypertension    ?  takes Metoprolol and Hyzaar daily   ? Muscle spasm    ?  takes Robaxin as needed  ? Nerve pain    ?  takes Gabapentin as needed  ? OSA (obstructive sleep apnea) 12/01/2013  ?  16.6 AHI and RDI of over 30. REM AHI over 30.    ? Pars defect    ?  injection by Dr.Nudelman and states it is very well controlled  ? Urinary frequency    ? Weakness    ?  numbness and tingling in right arm down to fingers  ?  ?  ?     ?Past Surgical History:  ?Procedure Laterality Date  ? ANKLE ARTHROPLASTY Right    ? ANTERIOR CERVICAL DECOMP/DISCECTOMY FUSION N/A 06/28/2016  ?  Procedure: ANTERIOR CERVICAL DECOMPRESSION FUSION CERVICAL THREE-FOUR ,CERVICAL FOUR-FIVE,CERVICAL FIVE-SIX;  Surgeon: Jovita Gamma, MD;  Location: [facility];  Service: Neurosurgery;   Laterality: N/A;  ? APPENDECTOMY      ? COLONOSCOPY      ? lap inguinal hernia repair wiht mesh Bilateral 10/2003  ? REFRACTIVE SURGERY   2003  ? TONSILLECTOMY      ? wisdom teeth extracted      ?  ?  ?Social History ?ALYUS MOFIELD  reports that he has never smoked. He has never used smokeless tobacco. He reports current alcohol use. He reports that he does not use drugs. ?  ?family history includes Hyperlipidemia in his brother; Hypertension in his brother, father, and sister; Lung disease in his father; Lymphoma in his mother; Parkinson's disease in his father; Stroke in his maternal grandmother; Ulcerative colitis in his daughter. ?  ?    ?Allergies  ?Allergen Reactions  ? No Known Allergies    ?  ?  ?  ?  ?PHYSICAL EXAMINATION: ?Vital signs: BP 100/60 (BP Location: Left Arm, Patient Position: Sitting, Cuff Size: Normal)   Pulse 64   Ht 5' 4.5" (1.638 m) Comment: height measured without shoes  Wt 146 lb 8 oz (66.5 kg)   BMI 24.76 kg/m?   ?Constitutional: generally well-appearing, no acute  distress ?Psychiatric: alert and oriented x3, cooperative ?Eyes: extraocular movements intact, anicteric, conjunctiva pink ?Mouth: oral pharynx moist, no lesions ?Neck: supple no lymphadenopathy ?Cardiovascular: heart regular rate and rhythm, no murmur ?Lungs: clear to auscultation bilaterally ?Abdomen: soft, nontender, nondistended, no obvious ascites, no peritoneal signs, normal bowel sounds, no organomegaly ?Rectal: Deferred until colonoscopy ?Extremities: no clubbing, cyanosis, or lower extremity edema bilaterally ?Skin: no lesions on visible extremities ?Neuro: No focal deficits.  Cranial nerves intact ?  ?ASSESSMENT: ?  ?1.  Hemoccult positive stool.  Normal hemoglobin.  Rule out underlying GI mucosal lesion ?2.  GERD.  No alarm features. ?3.  Chronic aspirin and intermittent NSAID use. ?4.  Normal colonoscopy 2009 ?  ?  ?PLAN: ?  ?1.  Colonoscopy to evaluate Hemoccult-positive stool.The nature of the procedure, as  well as the risks, benefits, and alternatives were carefully and thoroughly reviewed with the patient. Ample time for discussion and questions allowed. The patient understood, was satisfied, and agreed to proceed.  ?2.  Upper endoscopy to evaluate Hemoccult-positive stool, chronic GERD, and rule out NSAID induced mucosal lesions.The nature of the procedure, as well as the risks, benefits, and alternatives were carefully and thoroughly reviewed with the patient. Ample time for discussion and questions allowed. The patient understood, was satisfied, and agreed to proceed.  ?3.  Further recommendations after the above completed  ? ?No interval clinical change since his above listed office evaluation recently.  No change in physical exam.  He is now for colonoscopy and upper endoscopy ?

## 2021-05-11 ENCOUNTER — Ambulatory Visit (INDEPENDENT_AMBULATORY_CARE_PROVIDER_SITE_OTHER): Payer: Medicare Other | Admitting: Neurology

## 2021-05-11 ENCOUNTER — Encounter: Payer: Self-pay | Admitting: Neurology

## 2021-05-11 VITALS — BP 134/80 | HR 58 | Ht 64.0 in | Wt 146.0 lb

## 2021-05-11 DIAGNOSIS — G4739 Other sleep apnea: Secondary | ICD-10-CM | POA: Diagnosis not present

## 2021-05-11 DIAGNOSIS — G4710 Hypersomnia, unspecified: Secondary | ICD-10-CM | POA: Diagnosis not present

## 2021-05-11 DIAGNOSIS — Z91199 Patient's noncompliance with other medical treatment and regimen due to unspecified reason: Secondary | ICD-10-CM

## 2021-05-11 NOTE — Patient Instructions (Signed)

## 2021-05-11 NOTE — Progress Notes (Signed)
? ? ?SLEEP MEDICINE CLINIC ?  ? ?Provider:  [name], MD  ?Primary Care Physician:  [name], MD ?[address] ?[place] [address]  ? ?  ?Referring Provider: [name], Md ?[address] ?[place],  [place] [address]  ?  ?  ?    ?Chief Complaint according to patient   ?Patient presents with:  ?  ? New Patient (Initial Visit)  ?     ?  ?  ?HISTORY OF PRESENT ILLNESS:  ?Joshua [name] is a 71 y.o. year old White or Caucasian male patient seen here as a referral on 05/11/2021 from PCP for a .  ?Chief concern according to patient :  I just can't get myself to use th CPAP each night and I have high residual AHIs, and also more word finding delay.  ?  ? Joshua [name]  has a past medical history of Anemia, Anxiety, Arthritis, Depression, Hyperlipidemia, Hypertension, Muscle spasm, Nerve pain, OSA (obstructive sleep apnea) (12/01/2013), Pars defect, Urinary frequency, and Weakness.Marland Kitchen   ?He  continues to struggle with nightly compliance of CPAP therapy.  He was diagnosed with complex sleep apnea:  ?A HST On apnea Link- and has been in lab tested as well. He admits that he often falls asleep without placing therapy. He has been diagnosed with sleep apnea in the year 2015, but initially had no interest in CPAP. He underwent PSG on 13 November 2013 after Epworth sleepiness score was endorsed at 17 points at the time ?He underwent HST on 01-03-2017, resulting in a AHI of 24.6/hr. and nadir Spo2 was 65%, 24 minutes total desaturation time. His apnea was central apnea, therefor returning for attended titration with possible change to BiPAP.  ?He was titrated to 7 cm water, with AirFitP10. ?Residual AHI 10.8 /h ! 80% compliance by days, and 27% by hours. 11-21 2019.  ? ? We have discussed the role of untreated sleep apnea in comorbidities including concerns of memory loss.  He was encouraged to place CPAP as soon as he feels he may drift to sleep.  He was encouraged to use CPAP nightly and for greater  than 4 hours each night.   ? ?MMSE last done in 05-07-2018  was abnormal, especially considering patient's education history and career.  He was unable to perform serial subtractions.  I do feel that there is a level of anxiety relating to memory loss and word finding concerns.  He does have a history of attention deficit that could be contributing as well.  He will continue close follow-up with primary care for these concerns.  I do feel that it would be helpful to evaluate concerns with MRI of the brain due to family history of dementia.  We ordered a formal cognitive test to help evaluate concerns.  The patient did not respond to the calls of Dr Melvyn Novas office and never scheduled the visit for testing.  ? ?He has a history of GERD, and this affects sometimes sleep. ? ? ?Review of Systems: ?Out of a complete 14 system review, the patient complains of only the following symptoms, and all other reviewed systems are negative.:  ?Fatigue, sleepiness , snoring, fragmented sleep, Insomnia . ?  ?How likely are you to doze in the following situations: ?0 = not likely, 1 = slight chance, 2 = moderate chance, 3 = high chance ?  ?Sitting and Reading? ?Watching Television? ?Sitting inactive in a public place (theater or meeting)? ?As a passenger in a  car for an hour without a break? ?Lying down in the afternoon when circumstances permit? ?Sitting and talking to someone? ?Sitting quietly after lunch without alcohol? ?In a car, while stopped for a few minutes in traffic? ?  ?Total = 10/ 24 points  ? FSS endorsed at 24/ 63 points.  ? ?Social History  ? ?Socioeconomic History  ? Marital status: Married  ?  Spouse name: [name]  ? Number of children: 2  ? Years of education: College  ? Highest education level: Not on file  ?Occupational History  ?  Employer: [employer]  ? Occupation: retired Chief Financial Officer  ?Tobacco Use  ? Smoking status: Never  ? Smokeless tobacco: Never  ?Vaping Use  ? Vaping Use: Never used  ?Substance and Sexual  Activity  ? Alcohol use: Yes  ?  Comment: .5 per Joshua  ? Drug use: No  ? Sexual activity: Not on file  ?Other Topics Concern  ? Not on file  ?Social History Narrative  ? Patient is married [name])  ? Patient has two children.  ? Patient drinks two caffeine drinks per Joshua.  ? Patient is right-handed.  ? Patient has a college education.  ? ?Social Determinants of Health  ? ?Financial Resource Strain: Not on file  ?Food Insecurity: Not on file  ?Transportation Needs: Not on file  ?Physical Activity: Not on file  ?Stress: Not on file  ?Social Connections: Not on file  ? ? ?Family History  ?Problem Relation Age of Onset  ? Lymphoma Mother   ? Lung disease Father   ? Parkinson's disease Father   ? Hypertension Father   ? Hypertension Sister   ? Hyperlipidemia Brother   ? Hypertension Brother   ? Stroke Maternal Grandmother   ? Ulcerative colitis Daughter   ? ? ?Past Medical History:  ?Diagnosis Date  ? Anemia   ? as a child  ? Anxiety   ? Arthritis   ? Depression   ? takes Wellbutrin daily  ? Hyperlipidemia   ? takes Simvastatin daily  ? Hypertension   ? takes Metoprolol and Hyzaar daily   ? Muscle spasm   ? takes Robaxin as needed  ? Nerve pain   ? takes Gabapentin as needed  ? OSA (obstructive sleep apnea) 12/01/2013  ? 16.6 AHI and RDI of over 30. REM AHI over 30.    ? Pars defect   ? injection by Dr.Nudelman and states it is very well controlled  ? Urinary frequency   ? Weakness   ? numbness and tingling in right arm down to fingers  ? ? ?Past Surgical History:  ?Procedure Laterality Date  ? ANKLE ARTHROPLASTY Right   ? ANTERIOR CERVICAL DECOMP/DISCECTOMY FUSION N/A 06/28/2016  ? Procedure: ANTERIOR CERVICAL DECOMPRESSION FUSION CERVICAL THREE-FOUR ,CERVICAL FOUR-FIVE,CERVICAL FIVE-SIX;  Surgeon: Jovita Gamma, MD;  Location: [facility];  Service: Neurosurgery;  Laterality: N/A;  ? APPENDECTOMY    ? COLONOSCOPY    ? lap inguinal hernia repair wiht mesh Bilateral 10/2003  ? REFRACTIVE SURGERY  2003  ? TONSILLECTOMY     ? wisdom teeth extracted    ?  ? ?Current Outpatient Medications on File Prior to Visit  ?Medication Sig Dispense Refill  ? amLODipine (NORVASC) 5 MG tablet TAKE 1 TABLET BY MOUTH ONCE A Joshua 90 tablet 2  ? ascorbic acid (VITAMIN C) 1000 MG tablet Take 1 tablet by mouth. Twice a week    ? buPROPion (WELLBUTRIN SR) 150 MG 12 hr tablet Take 1 tablet (  150 mg total) by mouth every morning. 90 tablet 3  ? famciclovir (FAMVIR) 125 MG tablet TAKE 1 TABLET BY MOUTH TWICE A Joshua AS NEEDED 30 tablet 5  ? famotidine (PEPCID) 20 MG tablet TAKE 1 TABLET BY MOUTH ONCE DAILY AS NEEDED FOR HEART BURN 90 tablet 4  ? losartan (COZAAR) 50 MG tablet TAKE 1 TABLET BY MOUTH ONCE A Joshua 90 tablet 3  ? methylphenidate (METADATE ER) 20 MG ER tablet TAKE 1 TABLET BY MOUTH ONCE DAILY IN THE MORNING 30 tablet 0  ? metoprolol succinate (TOPROL-XL) 25 MG 24 hr tablet TAKE 1 TABLET BY MOUTH ONCE A Joshua 90 tablet 3  ? Multiple Vitamin (MULTIVITAMINS PO) Take 1 tablet by mouth daily.    ? simvastatin (ZOCOR) 40 MG tablet TAKE 1 TABLET BY MOUTH ONCE DAILY 90 tablet 3  ? SYRINGE-NEEDLE, DISP, 3 ML (B-D 3CC LUER-LOK SYR 18GX1-1/2) 18G X 1-1/2" 3 ML MISC Use as directed with Testosterone 20 each 0  ? tamsulosin (FLOMAX) 0.4 MG CAPS capsule Take 1 capsule (0.4 mg total) by mouth daily. 90 capsule 3  ? testosterone cypionate (DEPOTESTOSTERONE CYPIONATE) 200 MG/ML injection Inject 1 ml into the muscle every 3 weeks as directed 3 mL 0  ? Vitamin E 268 MG (400 UNIT) CAPS Take 1 tablet by mouth once a week.    ? ?No current facility-administered medications on file prior to visit.  ? ? ?Allergies  ?Allergen Reactions  ? No Known Allergies   ? ? ?Physical exam: ? ?Today's Vitals  ? 05/11/21 1421  ?BP: 134/80  ?Pulse: (!) 58  ?Weight: 146 lb (66.2 kg)  ?Height: '5\' 4"'$  (1.626 m)  ? ?Body mass index is 25.06 kg/m?.  ? ?Wt Readings from Last 3 Encounters:  ?05/11/21 146 lb (66.2 kg)  ?05/10/21 146 lb (66.2 kg)  ?03/16/21 146 lb 8 oz (66.5 kg)  ?  ? ?Ht Readings from  Last 3 Encounters:  ?05/11/21 '5\' 4"'$  (1.626 m)  ?05/10/21 '5\' 4"'$  (1.626 m)  ?03/16/21 5' 4.5" (1.638 m)  ?  ?  ?General: The patient is awake, alert and appears not in acute distress. The patient is well groome

## 2021-05-12 ENCOUNTER — Telehealth: Payer: Self-pay

## 2021-05-12 ENCOUNTER — Other Ambulatory Visit (HOSPITAL_COMMUNITY): Payer: Self-pay

## 2021-05-12 ENCOUNTER — Telehealth: Payer: Self-pay | Admitting: *Deleted

## 2021-05-12 MED ORDER — METHYLPHENIDATE HCL ER 20 MG PO TBCR
EXTENDED_RELEASE_TABLET | ORAL | 0 refills | Status: DC
  Filled 2021-05-12: qty 30, 30d supply, fill #0

## 2021-05-12 NOTE — Telephone Encounter (Signed)
?  Follow up Call- ? ? ?  05/10/2021  ?  7:52 AM  ?Call back number  ?Post procedure Call Back phone  # [phone]  ?Permission to leave phone message Yes  ?  ? ?Patient questions: ?Message left to call us if necessary. ?

## 2021-05-12 NOTE — Telephone Encounter (Signed)
Follow up call placed, VM obtained and message left. ?SChaplin, RN,BSN ? ?

## 2021-05-16 ENCOUNTER — Other Ambulatory Visit (HOSPITAL_COMMUNITY): Payer: Self-pay

## 2021-05-16 ENCOUNTER — Encounter: Payer: Self-pay | Admitting: Internal Medicine

## 2021-05-20 ENCOUNTER — Other Ambulatory Visit (HOSPITAL_COMMUNITY): Payer: Self-pay

## 2021-05-23 DIAGNOSIS — I10 Essential (primary) hypertension: Secondary | ICD-10-CM | POA: Diagnosis not present

## 2021-05-23 DIAGNOSIS — R972 Elevated prostate specific antigen [PSA]: Secondary | ICD-10-CM | POA: Diagnosis not present

## 2021-05-23 DIAGNOSIS — Z1389 Encounter for screening for other disorder: Secondary | ICD-10-CM | POA: Diagnosis not present

## 2021-05-23 DIAGNOSIS — D751 Secondary polycythemia: Secondary | ICD-10-CM | POA: Diagnosis not present

## 2021-05-23 DIAGNOSIS — Z1331 Encounter for screening for depression: Secondary | ICD-10-CM | POA: Diagnosis not present

## 2021-05-23 DIAGNOSIS — R3911 Hesitancy of micturition: Secondary | ICD-10-CM | POA: Diagnosis not present

## 2021-05-23 DIAGNOSIS — G4733 Obstructive sleep apnea (adult) (pediatric): Secondary | ICD-10-CM | POA: Diagnosis not present

## 2021-05-23 DIAGNOSIS — E291 Testicular hypofunction: Secondary | ICD-10-CM | POA: Diagnosis not present

## 2021-05-25 ENCOUNTER — Other Ambulatory Visit (HOSPITAL_COMMUNITY): Payer: Self-pay

## 2021-05-26 ENCOUNTER — Other Ambulatory Visit (HOSPITAL_COMMUNITY): Payer: Self-pay

## 2021-05-26 MED ORDER — TAMSULOSIN HCL 0.4 MG PO CAPS
ORAL_CAPSULE | ORAL | 3 refills | Status: DC
  Filled 2021-05-26: qty 90, 90d supply, fill #0

## 2021-06-02 ENCOUNTER — Other Ambulatory Visit (HOSPITAL_COMMUNITY): Payer: Self-pay

## 2021-06-02 DIAGNOSIS — N401 Enlarged prostate with lower urinary tract symptoms: Secondary | ICD-10-CM | POA: Diagnosis not present

## 2021-06-02 DIAGNOSIS — R3912 Poor urinary stream: Secondary | ICD-10-CM | POA: Diagnosis not present

## 2021-06-02 DIAGNOSIS — R350 Frequency of micturition: Secondary | ICD-10-CM | POA: Diagnosis not present

## 2021-06-02 MED ORDER — TAMSULOSIN HCL 0.4 MG PO CAPS
0.4000 mg | ORAL_CAPSULE | Freq: Two times a day (BID) | ORAL | 3 refills | Status: DC
  Filled 2021-06-02 – 2021-06-15 (×2): qty 180, 90d supply, fill #0

## 2021-06-08 ENCOUNTER — Other Ambulatory Visit (HOSPITAL_COMMUNITY): Payer: Self-pay

## 2021-06-09 DIAGNOSIS — M170 Bilateral primary osteoarthritis of knee: Secondary | ICD-10-CM | POA: Diagnosis not present

## 2021-06-14 ENCOUNTER — Telehealth: Payer: Self-pay

## 2021-06-14 NOTE — Telephone Encounter (Signed)
LVM for pt to call me back to schedule sleep study  

## 2021-06-15 ENCOUNTER — Other Ambulatory Visit (HOSPITAL_COMMUNITY): Payer: Self-pay

## 2021-06-16 ENCOUNTER — Telehealth: Payer: Self-pay

## 2021-06-16 DIAGNOSIS — M170 Bilateral primary osteoarthritis of knee: Secondary | ICD-10-CM | POA: Diagnosis not present

## 2021-06-16 NOTE — Telephone Encounter (Signed)
Called pt to schedule his sleep study but pt states that he is feeling much better and does not wish to proceed with the study. Pt was advised that his orders were good for a year. ?

## 2021-06-21 ENCOUNTER — Other Ambulatory Visit (HOSPITAL_COMMUNITY): Payer: Self-pay

## 2021-06-21 MED ORDER — METHYLPHENIDATE HCL ER 20 MG PO TBCR
EXTENDED_RELEASE_TABLET | ORAL | 0 refills | Status: DC
  Filled 2021-06-21: qty 30, 30d supply, fill #0

## 2021-06-22 ENCOUNTER — Other Ambulatory Visit (HOSPITAL_COMMUNITY): Payer: Self-pay

## 2021-06-28 ENCOUNTER — Other Ambulatory Visit (HOSPITAL_COMMUNITY): Payer: Self-pay

## 2021-06-28 MED ORDER — KETOTIFEN FUMARATE 0.025 % OP SOLN
OPHTHALMIC | 0 refills | Status: DC
  Filled 2021-06-28: qty 5, 10d supply, fill #0

## 2021-06-29 ENCOUNTER — Other Ambulatory Visit (HOSPITAL_COMMUNITY): Payer: Self-pay

## 2021-07-05 ENCOUNTER — Other Ambulatory Visit (HOSPITAL_COMMUNITY): Payer: Self-pay

## 2021-07-05 MED ORDER — FAMCICLOVIR 125 MG PO TABS
125.0000 mg | ORAL_TABLET | Freq: Two times a day (BID) | ORAL | 3 refills | Status: AC
  Filled 2021-07-05: qty 180, 90d supply, fill #0

## 2021-07-06 ENCOUNTER — Other Ambulatory Visit (HOSPITAL_COMMUNITY): Payer: Self-pay

## 2021-07-07 ENCOUNTER — Other Ambulatory Visit (HOSPITAL_COMMUNITY): Payer: Self-pay

## 2021-07-11 DIAGNOSIS — M25561 Pain in right knee: Secondary | ICD-10-CM | POA: Diagnosis not present

## 2021-07-14 DIAGNOSIS — N401 Enlarged prostate with lower urinary tract symptoms: Secondary | ICD-10-CM | POA: Diagnosis not present

## 2021-07-14 DIAGNOSIS — R3912 Poor urinary stream: Secondary | ICD-10-CM | POA: Diagnosis not present

## 2021-07-25 ENCOUNTER — Other Ambulatory Visit (HOSPITAL_COMMUNITY): Payer: Self-pay

## 2021-07-25 MED ORDER — AMLODIPINE BESYLATE 5 MG PO TABS
ORAL_TABLET | ORAL | 3 refills | Status: DC
  Filled 2021-07-25: qty 90, 90d supply, fill #0

## 2021-07-25 MED ORDER — TAMSULOSIN HCL 0.4 MG PO CAPS
0.4000 mg | ORAL_CAPSULE | Freq: Two times a day (BID) | ORAL | 3 refills | Status: DC
  Filled 2021-07-25: qty 180, 90d supply, fill #0

## 2021-07-29 DIAGNOSIS — M170 Bilateral primary osteoarthritis of knee: Secondary | ICD-10-CM | POA: Diagnosis not present

## 2021-08-01 ENCOUNTER — Other Ambulatory Visit (HOSPITAL_COMMUNITY): Payer: Self-pay

## 2021-08-01 MED ORDER — GABAPENTIN 300 MG PO CAPS
ORAL_CAPSULE | ORAL | 3 refills | Status: DC
  Filled 2021-08-01: qty 30, 30d supply, fill #0

## 2021-08-04 ENCOUNTER — Other Ambulatory Visit (HOSPITAL_COMMUNITY): Payer: Self-pay

## 2021-08-05 ENCOUNTER — Other Ambulatory Visit (HOSPITAL_COMMUNITY): Payer: Self-pay

## 2021-08-05 MED ORDER — EZETIMIBE 10 MG PO TABS
10.0000 mg | ORAL_TABLET | Freq: Every day | ORAL | 1 refills | Status: DC
Start: 2021-08-05 — End: 2022-04-03
  Filled 2021-08-05: qty 90, 90d supply, fill #0
  Filled 2021-11-19: qty 90, 90d supply, fill #1

## 2021-08-10 ENCOUNTER — Other Ambulatory Visit (HOSPITAL_COMMUNITY): Payer: Self-pay

## 2021-08-10 MED ORDER — METHYLPHENIDATE HCL ER 20 MG PO TBCR
EXTENDED_RELEASE_TABLET | ORAL | 0 refills | Status: DC
  Filled 2021-08-10: qty 30, 30d supply, fill #0

## 2021-08-11 ENCOUNTER — Other Ambulatory Visit (HOSPITAL_COMMUNITY): Payer: Self-pay

## 2021-08-16 ENCOUNTER — Other Ambulatory Visit (HOSPITAL_COMMUNITY): Payer: Self-pay

## 2021-08-22 ENCOUNTER — Other Ambulatory Visit (HOSPITAL_COMMUNITY): Payer: Self-pay

## 2021-08-22 MED ORDER — TESTOSTERONE CYPIONATE 200 MG/ML IM SOLN
INTRAMUSCULAR | 0 refills | Status: DC
  Filled 2021-08-22: qty 3, 63d supply, fill #0

## 2021-09-12 ENCOUNTER — Other Ambulatory Visit (HOSPITAL_COMMUNITY): Payer: Self-pay

## 2021-09-12 MED ORDER — GABAPENTIN 300 MG PO CAPS
ORAL_CAPSULE | ORAL | 3 refills | Status: DC
  Filled 2021-09-12: qty 90, 90d supply, fill #0

## 2021-09-13 ENCOUNTER — Other Ambulatory Visit (HOSPITAL_COMMUNITY): Payer: Self-pay

## 2021-09-13 MED ORDER — METHYLPHENIDATE HCL ER 20 MG PO TBCR
EXTENDED_RELEASE_TABLET | ORAL | 0 refills | Status: DC
  Filled 2021-09-13: qty 30, 30d supply, fill #0

## 2021-09-19 ENCOUNTER — Other Ambulatory Visit (HOSPITAL_COMMUNITY): Payer: Self-pay

## 2021-10-13 DIAGNOSIS — M170 Bilateral primary osteoarthritis of knee: Secondary | ICD-10-CM | POA: Diagnosis not present

## 2021-10-18 ENCOUNTER — Telehealth: Payer: Self-pay | Admitting: Physical Medicine and Rehabilitation

## 2021-10-18 NOTE — Telephone Encounter (Signed)
Proficient referral from Dr. Sharlett Iles "low back pain", have the paper referral, needs OV

## 2021-10-19 NOTE — Telephone Encounter (Signed)
Called pt to get sch. LVM#1  

## 2021-10-26 ENCOUNTER — Encounter: Payer: Self-pay | Admitting: Physical Medicine and Rehabilitation

## 2021-10-26 ENCOUNTER — Ambulatory Visit (INDEPENDENT_AMBULATORY_CARE_PROVIDER_SITE_OTHER): Payer: Medicare Other | Admitting: Physical Medicine and Rehabilitation

## 2021-10-26 VITALS — BP 145/84 | HR 64

## 2021-10-26 DIAGNOSIS — G8929 Other chronic pain: Secondary | ICD-10-CM | POA: Diagnosis not present

## 2021-10-26 DIAGNOSIS — M47816 Spondylosis without myelopathy or radiculopathy, lumbar region: Secondary | ICD-10-CM | POA: Diagnosis not present

## 2021-10-26 DIAGNOSIS — M48061 Spinal stenosis, lumbar region without neurogenic claudication: Secondary | ICD-10-CM

## 2021-10-26 DIAGNOSIS — M545 Low back pain, unspecified: Secondary | ICD-10-CM | POA: Diagnosis not present

## 2021-10-26 DIAGNOSIS — M4306 Spondylolysis, lumbar region: Secondary | ICD-10-CM | POA: Diagnosis not present

## 2021-10-26 NOTE — Progress Notes (Signed)
Numeric Pain Rating Scale and Functional Assessment Average Pain 7   In the last MONTH (on 0-10 scale) has pain interfered with the following?  1. General activity like being  able to carry out your everyday physical activities such as walking, climbing stairs, carrying groceries, or moving a chair?  Rating(2)    Patient states that low back hurts when walking and standing, but relief when sitting.  Pain does stay in the back.  Takes gabapentin and Advil as needed.

## 2021-10-26 NOTE — Progress Notes (Signed)
Joshua [name] - 71 y.o. male MRN [id number]  Date of birth: 03/08/1950  Office Visit Note: Visit Date: 10/26/2021 PCP: [name], MD Referred by: [name], MD  Subjective: Chief Complaint  Patient presents with   Lower Back - Pain   HPI: Joshua [name] is a 71 y.o. male who comes in today per the request of Dr. Leanna Battles for evaluation of left sided lower back pain. 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 Pain is exacerbated by movement and activity, describes as a sharp and sore sensation, currently rates as 7 out of 10. Some relief of pain with home exercise regimen, rest and use of medications. Patient states he takes Gabapentin 300 mg as needed for pain. Lumbar MRI imaging from 2014 exhibits multifactorial marked bilateral foraminal narrowing with compression of the exiting L4 nerve roots bilaterally, there is moderate spinal canal stenosis noted at the level of L4-L5. Patient was last seen in our office in 2017 for evaluation, per patient he underwent injection in our office that did provide significant relief. I did review notes on SOS from 2017, I was unable to locate procedure note, however office note discusses possible injection and does states patient wanted to hold on any interventions at that time. Patient also reports bilateral knee pain, currently managed by Dr. Gaynelle Arabian at [facility], he is currently planning for bilateral knee replacements. Patient denies focal weakness, numbness and tingling. Patient denies recent trauma or falls.    Review of Systems  Musculoskeletal:  Positive for back pain.  Neurological:  Negative for tingling, sensory change, focal weakness and weakness.  All other systems reviewed and are negative.  Otherwise per HPI.  Assessment & Plan: Visit Diagnoses:    ICD-10-CM   1. Chronic left-sided low back pain without sciatica  M54.50 MR LUMBAR SPINE WO CONTRAST    G89.29     2. Spondylosis without myelopathy or radiculopathy, lumbar region  M47.816 MR LUMBAR SPINE WO CONTRAST    3. Facet arthropathy, lumbar  M47.816 MR LUMBAR SPINE WO CONTRAST    4. Pars defect of lumbar spine  M43.06 MR LUMBAR SPINE WO CONTRAST    5. Spinal stenosis of lumbar region without neurogenic claudication  M48.061 MR LUMBAR SPINE WO CONTRAST       Plan: Findings:  Chronic, worsening and severe left sided lower back pain. Patient continues to have severe pain despite good conservative therapies such as home exercise regimen, rest and use of medications. Patients clinical presentation and exam are consistent with facet mediated pain. Next step is to obtain lumbar MRI imaging. Although his symptoms seem more facet related we are concerned about worsening spinal canal stenosis. I did speak with patient about possible lumbar injection depending on results of lumbar MRI. I also talked with him about medication management and discussed Gabapentin specifically, would recommend taking this medication consistently and titrating up as needed. Patient would like to proceed with MRI first, we will revisit this in the future. We will have patient follow up after lumbar MRI is obtained for review and to discuss treatment options. No red flag symptoms noted upon exam today.       Meds & Orders: No orders of the defined types were placed in this encounter.   Orders Placed This Encounter  Procedures   MR LUMBAR SPINE WO CONTRAST    Follow-up: Return for follow up after lumbar MRI imaging is obtained.  Procedures: No procedures performed      Clinical History: MRI LUMBAR SPINE WITHOUT CONTRAST   Technique:  Multiplanar and multiecho pulse sequences of the lumbar  spine were obtained without intravenous contrast.   Comparison: 10/10/2011 plain film examination.  No comparison MR.   Findings: Motion degraded exam.   Last fully open disc space is labeled L5-S1.  Present  examination  incorporates from T11-12 disc space through the lower sacrum.   Conus just below T12-L1 disc space level.   Atherosclerotic type changes of the aorta with ectasia.  At the L3  level, small aneurysm with maximal AP dimension of 3.2 cm whereas  just above this level the aorta measures 2.4 cm.   T11-12:  Small Schmorl's node deformity.  Shallow broad-based left-  sided protrusion.   T12-L1:  Small Schmorl's node deformity.   L1-2:  Facet joint degenerative changes.  Minimal retrolisthesis  L1.  Disc degeneration with small Schmorl's node deformity in the  anterior osteophyte.  Bulge and spur.  Multifactorial very mild  spinal stenosis greater on the left.  Very mild bilateral foraminal  narrowing.   L2-3:  Facet joint degenerative changes.  Disc degeneration with  mild retrolisthesis L2.  Moderate bulge. Mild spinal stenosis.  Very mild bilateral foraminal narrowing.   L3-4:  Moderate facet joint degenerative changes.   L4-5:  Bilateral pars defects with significant bony overgrowth and  surrounding bony edema.  1 cm anterior slip of L4.  Marked L4 disc  degeneration.  Broad-based protrusion with slight cephalad  extension combined with broad-based spur posterior-superior aspect  L5 vertebra causes marked bilateral foraminal narrowing with  compression of the exiting L4 nerve roots bilaterally. Prominent  lateral osteophyte.  Marked left-sided and mild right-sided lateral  recess stenosis.  Moderate spinal stenosis in a right left  direction.  Prominent dorsal epidural fat.   L5-S1:  Mild to moderate facet joint degenerative changes. Mild  bulge.  Mild lateral recess stenosis which is more notable lateral  position on the left approaching but not compressing the exiting L5  nerve roots.   IMPRESSION:   Summary of pertinent findings includes:   Bilateral L4 pars defects.   L4-5 multifactorial marked bilateral foraminal narrowing with  compression of the exiting  L4 nerve roots bilaterally.  Marked left-  sided and mild right-sided lateral recess stenosis.  Moderate  spinal stenosis in a right to left direction.   T11-12 shallow broad-based left-sided protrusion.   L1-2 multifactorial very mild spinal stenosis greater on the left.   L2-3 mild spinal stenosis.   L3-4 moderate facet joint degenerative changes.   L5-S1 mild lateral recess stenosis which is more notable lateral  position on the left approaching but not compressing the exiting L5  nerve roots.   Atherosclerotic type changes of the aorta with ectasia.  At the L3  level, small aneurysm with maximal AP dimension of 3.2 cm.   He reports that he has never smoked. He has never used smokeless tobacco. No results for input(s): "HGBA1C", "LABURIC" in the last 8760 hours.  Objective:  VS:  HT:    WT:   BMI:     BP:(!) 145/84  HR:64bpm  TEMP: ( )  RESP:  Physical Exam Vitals and nursing note reviewed.  HENT:     Head: Normocephalic and atraumatic.     Right Ear: External ear normal.     Left Ear: External ear normal.     Nose: Nose normal.  Mouth/Throat:     Mouth: Mucous membranes are moist.  Eyes:     Extraocular Movements: Extraocular movements intact.  Cardiovascular:     Rate and Rhythm: Normal rate.     Pulses: Normal pulses.  Pulmonary:     Effort: Pulmonary effort is normal.  Abdominal:     General: Abdomen is flat. There is no distension.  Musculoskeletal:        General: Tenderness present.     Cervical back: Normal range of motion.     Comments: Pt rises from seated position to standing without difficulty. Concordant low back pain with facet loading, lumbar spine extension and rotation. Strong distal strength without clonus, no pain upon palpation of greater trochanters. Sensation intact bilaterally. Walks independently, gait steady.   Skin:    General: Skin is warm and dry.     Capillary Refill: Capillary refill takes less than 2 seconds.  Neurological:      General: No focal deficit present.     Mental Status: He is alert and oriented to person, place, and time.  Psychiatric:        Mood and Affect: Mood normal.        Behavior: Behavior normal.     Ortho Exam  Imaging: No results found.  Past Medical/Family/Surgical/Social History: Medications & Allergies reviewed per EMR, new medications updated. Patient Active Problem List   Diagnosis Date Noted   Abdominal aortic aneurysm (AAA) without rupture ([place]) 05/20/2018   Poor compliance with CPAP treatment 05/20/2018   HTN, goal below 140/80 05/20/2018   Complex sleep apnea syndrome 04/01/2018   Snoring 12/21/2016   UARS (upper airway resistance syndrome) 12/21/2016   HNP (herniated nucleus pulposus), cervical 06/28/2016   Spinal stenosis of cervical region 05/02/2016   Primary osteoarthritis, right shoulder 05/02/2016   OSA (obstructive sleep apnea) 12/01/2013   ERRONEOUS ENCOUNTER--DISREGARD 10/27/2013   Hypersomnia, persistent 10/03/2013   Past Medical History:  Diagnosis Date   Anemia    as a child   Anxiety    Arthritis    Depression    takes Wellbutrin daily   Hyperlipidemia    takes Simvastatin daily   Hypertension    takes Metoprolol and Hyzaar daily    Muscle spasm    takes Robaxin as needed   Nerve pain    takes Gabapentin as needed   OSA (obstructive sleep apnea) 12/01/2013   16.6 AHI and RDI of over 30. REM AHI over 30.     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 and states it is very well controlled   Urinary frequency    Weakness    numbness and tingling in right arm down to fingers   Family History  Problem Relation Age of Onset   Lymphoma Mother    Lung disease Father    Parkinson's disease Father    Hypertension Father    Hypertension Sister    Hyperlipidemia Brother    Hypertension Brother    Stroke Maternal Grandmother    Ulcerative colitis Daughter    Past Surgical History:  Procedure Laterality Date   ANKLE ARTHROPLASTY Right     ANTERIOR CERVICAL DECOMP/DISCECTOMY FUSION N/A 06/28/2016   Procedure: ANTERIOR CERVICAL DECOMPRESSION FUSION CERVICAL THREE-FOUR ,CERVICAL FOUR-FIVE,CERVICAL FIVE-SIX;  Surgeon: Jovita Gamma, MD;  Location: [facility];  Service: Neurosurgery;  Laterality: N/A;   APPENDECTOMY     COLONOSCOPY     lap inguinal hernia repair wiht mesh Bilateral 10/2003   REFRACTIVE SURGERY  2003   TONSILLECTOMY  wisdom teeth extracted     Social History   Occupational History    Employer: [employer]   Occupation: retired Chief Financial Officer  Tobacco Use   Smoking status: Never   Smokeless tobacco: Never  Vaping Use   Vaping Use: Never used  Substance and Sexual Activity   Alcohol use: Yes    Comment: .5 per day   Drug use: No   Sexual activity: Not on file

## 2021-10-31 ENCOUNTER — Telehealth: Payer: Self-pay | Admitting: Physical Medicine and Rehabilitation

## 2021-10-31 ENCOUNTER — Other Ambulatory Visit (HOSPITAL_COMMUNITY): Payer: Self-pay

## 2021-10-31 MED ORDER — METHYLPHENIDATE HCL ER 20 MG PO TBCR
20.0000 mg | EXTENDED_RELEASE_TABLET | Freq: Every morning | ORAL | 0 refills | Status: DC
  Filled 2021-10-31: qty 30, 30d supply, fill #0

## 2021-10-31 NOTE — Telephone Encounter (Signed)
scheduled

## 2021-10-31 NOTE — Telephone Encounter (Signed)
Patient called advised he is having an MRI November 06, 2021    and would like an appointment to go over the MRI with Dr. Ernestina Patches. The number to contact patient is [phone]

## 2021-11-02 ENCOUNTER — Other Ambulatory Visit (HOSPITAL_COMMUNITY): Payer: Self-pay

## 2021-11-06 ENCOUNTER — Ambulatory Visit
Admission: RE | Admit: 2021-11-06 | Discharge: 2021-11-06 | Disposition: A | Discharge status: Home / Self Care | Payer: Medicare Other | Source: Ambulatory Visit | Attending: Physical Medicine and Rehabilitation | Admitting: Physical Medicine and Rehabilitation

## 2021-11-06 DIAGNOSIS — M545 Low back pain, unspecified: Secondary | ICD-10-CM | POA: Diagnosis not present

## 2021-11-06 DIAGNOSIS — M4316 Spondylolisthesis, lumbar region: Secondary | ICD-10-CM | POA: Diagnosis not present

## 2021-11-06 DIAGNOSIS — M47816 Spondylosis without myelopathy or radiculopathy, lumbar region: Secondary | ICD-10-CM | POA: Diagnosis not present

## 2021-11-06 DIAGNOSIS — R202 Paresthesia of skin: Secondary | ICD-10-CM | POA: Diagnosis not present

## 2021-11-06 DIAGNOSIS — M48061 Spinal stenosis, lumbar region without neurogenic claudication: Secondary | ICD-10-CM | POA: Diagnosis not present

## 2021-11-08 ENCOUNTER — Ambulatory Visit (INDEPENDENT_AMBULATORY_CARE_PROVIDER_SITE_OTHER): Payer: Medicare Other | Admitting: Physical Medicine and Rehabilitation

## 2021-11-08 ENCOUNTER — Encounter: Payer: Self-pay | Admitting: Physical Medicine and Rehabilitation

## 2021-11-08 VITALS — BP 130/81 | HR 67

## 2021-11-08 DIAGNOSIS — M5416 Radiculopathy, lumbar region: Secondary | ICD-10-CM | POA: Diagnosis not present

## 2021-11-08 DIAGNOSIS — M48062 Spinal stenosis, lumbar region with neurogenic claudication: Secondary | ICD-10-CM | POA: Diagnosis not present

## 2021-11-08 DIAGNOSIS — M4726 Other spondylosis with radiculopathy, lumbar region: Secondary | ICD-10-CM | POA: Diagnosis not present

## 2021-11-08 DIAGNOSIS — M47816 Spondylosis without myelopathy or radiculopathy, lumbar region: Secondary | ICD-10-CM | POA: Diagnosis not present

## 2021-11-08 NOTE — Progress Notes (Signed)
Joshua [name] - 71 y.o. male MRN [id number]  Date of birth: 07/31/1950  Office Visit Note: Visit Date: 11/08/2021 PCP: [name], MD Referred by: [name], MD  Subjective: Chief Complaint  Patient presents with   Lower Back - Pain   HPI: Joshua [name] is a 71 y.o. male who comes in today for evaluation of chronic, worsening and severe left sided lower back pain radiating to buttock and down left leg to foot. He does reports numbness/tingling to entire left leg. Pain ongoing for several years and is exacerbated by standing and walking, describes pain as a sore sensation, also reports feeling of his leg being "asleep." Some relief of pain with home exercise regimen, rest and use of medications. Continues to take Gabapentin as needed for pain. Recent lumbar MRI imaging exhibits multilevel degenerative changes, mild spinal canal stenosis at L2-L3, there is chronic grade 1 anterolisthesis at L4-L5 secondary to chronic bilateral L4 pars defects, there is also mild to moderate multifactorial spinal canal stenosis at this level. Per patient he underwent lumbar injection in our office several years ago, however we are not able to find documentation of this injection. He reports significant relief of pain for 7-8 months with this procedure. Patient is a very active person, works out frequently and [employer] daily. Patient states pain is negatively impacting his daily, he would like to have injection as quickly as possible. Patient does have right total knee arthroplasty planned for January with Dr. Gaynelle Arabian. Patient denies focal weakness. Patient denies recent trauma or falls.    Review of Systems  Musculoskeletal:  Positive for back pain.  Neurological:  Negative for tingling, sensory change, focal weakness and weakness.  All other systems reviewed and are negative.  Otherwise per HPI.  Assessment & Plan: Visit Diagnoses:    ICD-10-CM   1. Lumbar radiculopathy  M54.16  Ambulatory referral to Physical Medicine Rehab    2. Spinal stenosis of lumbar region with neurogenic claudication  M48.062 Ambulatory referral to Physical Medicine Rehab    3. Other spondylosis with radiculopathy, lumbar region  M47.26 Ambulatory referral to Physical Medicine Rehab    4. Facet hypertrophy of lumbar region  M47.816 Ambulatory referral to Physical Medicine Rehab       Plan: Findings:  Chronic, worsening and severe left sided lower back pain radiating to buttock and down left leg to foot. Patient continues to have severe pain despite good conservative therapies such home exercise regimen, rest and use of medications. We did discuss recent lumbar MRI findings in detail with patient using imaging. Patients clinical presentation and exam are consistent with neurogenic claudication as a result of spinal canal stenosis. There is mild to moderate multifactorial spinal canal stenosis at the level of L4-L5. Next step is to perform diagnostic and hopefully therapeutic left L5-S1 interlaminar epidural steroid injection under fluoroscopic guidance. He is not currently taking anticoagulant medications. If good pain relief we did discuss possibility of repeating injection procedure infrequently as needed. If lower back pain remains post injection we could perform diagnostic facet joint injections. We did briefly discuss surgical consultation, however we feel this could involve multilevel fusion due to scoliosis and anterolisthesis. No red flag symptoms noted upon exam today.     Meds & Orders: No orders of the defined types were placed in this encounter.   Orders Placed This Encounter  Procedures   Ambulatory referral to Physical Medicine Rehab    Follow-up: Return for Left L5-S1 interlaminar  epidural steroid injection.   Procedures: No procedures performed      Clinical History: EXAM: MRI LUMBAR SPINE WITHOUT CONTRAST   TECHNIQUE: Multiplanar, multisequence MR imaging of the lumbar  spine was performed. No intravenous contrast was administered.   COMPARISON:  Ultrasound of the abdominal aorta 12/21/2017, radiograph 08/13/2015 and lumbar MRI 04/05/2012.   FINDINGS: Segmentation: Conventional anatomy assumed, with the last open disc space designated L5-S1.Concordant with previous imaging.   Alignment: Convex left scoliosis centered at L2. At L4-5, there is 11 mm of anterolisthesis secondary to chronic bilateral L4 pars defects.   Vertebrae: As above, chronic bilateral L4 pars defects with associated chronic endplate degenerative changes. There are progressive endplate degenerative changes at L2-3 which are asymmetric to the right. No evidence of acute fracture or worrisome osseous lesion. The visualized sacroiliac joints appear unremarkable.   Conus medullaris: Extends to the L1 level and appears normal.   Paraspinal and other soft tissues: No significant paraspinal findings. Fusiform dilatation of the abdominal aorta has increased from previous remote MRI with an AP diameter of up to 3.6 cm.   Disc levels:   Mild disc bulging and facet hypertrophy at T11-12 and T12-L1 without resulting spinal stenosis or nerve root encroachment.   L1-2: Chronic loss of disc height with annular disc bulging and endplate osteophytes. Mild facet hypertrophy. Mild narrowing of the lateral recesses and foramina without definite nerve root encroachment.   L2-3: Progressive loss of disc height with annular disc bulging and endplate osteophytes. Mild facet and ligamentous hypertrophy. Resulting mild spinal stenosis with asymmetric left lateral recess narrowing. Both foramina are mildly narrowed.   L3-4: Disc height and hydration are relatively preserved. Stable mild disc bulging with mildly progressive facet and ligamentous hypertrophy. Borderline spinal stenosis without nerve root encroachment.   L4-5: As above, chronic bilateral pars defects with chronic grade  1 anterolisthesis and chronic loss of disc height. There is mild facet and ligamentous hypertrophy. Resulting chronic mild to moderate multifactorial spinal stenosis with asymmetric lateral recess narrowing on the left. Chronic moderate to severe foraminal narrowing bilaterally which could affect either L4 nerve root.   L5-S1: Mildly progressive disc bulging and endplate osteophyte formation asymmetric to the left. Mild bilateral facet hypertrophy. Mild spinal stenosis with mild lateral recess and foraminal narrowing bilaterally.   IMPRESSION: 1. Compared with previous MRI from 2014, no acute findings or clear explanation for the patient's symptoms. 2. Chronic grade 1 anterolisthesis at L4-5 secondary to chronic bilateral L4 pars defects. Resulting chronic mild to moderate multifactorial spinal stenosis with asymmetric lateral recess narrowing on the left. Chronic moderate to severe foraminal narrowing bilaterally could affect either L4 nerve root. 3. Mildly progressive spondylosis at L2-3 and L5-S1 without resulting high-grade spinal stenosis or definite nerve root encroachment. 4. Fusiform dilatation of the abdominal aorta has increased from remote lumbar MRI in 2014, now measuring up to 3.6 cm in AP diameter. The aorta measured up to 3.3 x 3.5 cm on aortic ultrasound performed 4 years ago. Recommend follow-up ultrasound every 2 years. Reference: J Am Coll Radiol 9528;41:324-401.     Electronically Signed   By: Richardean Sale M.D.   On: 11/08/2021 13:37   He reports that he has never smoked. He has never used smokeless tobacco. No results for input(s): "HGBA1C", "LABURIC" in the last 8760 hours.  Objective:  VS:  HT:    WT:   BMI:     BP:130/81  HR:67bpm  TEMP: ( )  RESP:95 % Physical Exam  Vitals and nursing note reviewed.  HENT:     Head: Normocephalic and atraumatic.     Right Ear: External ear normal.     Left Ear: External ear normal.     Nose: Nose normal.      Mouth/Throat:     Mouth: Mucous membranes are moist.  Eyes:     Extraocular Movements: Extraocular movements intact.  Cardiovascular:     Rate and Rhythm: Normal rate.     Pulses: Normal pulses.  Pulmonary:     Effort: Pulmonary effort is normal.  Abdominal:     General: Abdomen is flat. There is no distension.  Musculoskeletal:        General: Tenderness present.     Cervical back: Normal range of motion.     Comments: Pt rises from seated position to standing without difficulty. Good lumbar range of motion. Strong distal strength without clonus, no pain upon palpation of greater trochanters. Sensation intact bilaterally. Walks independently, gait steady.   Skin:    General: Skin is warm and dry.     Capillary Refill: Capillary refill takes less than 2 seconds.  Neurological:     General: No focal deficit present.     Mental Status: He is alert and oriented to person, place, and time.  Psychiatric:        Mood and Affect: Mood normal.        Behavior: Behavior normal.     Ortho Exam  Imaging: No results found.  Past Medical/Family/Surgical/Social History: Medications & Allergies reviewed per EMR, new medications updated. Patient Active Problem List   Diagnosis Date Noted   Abdominal aortic aneurysm (AAA) without rupture ([place]) 05/20/2018   Poor compliance with CPAP treatment 05/20/2018   HTN, goal below 140/80 05/20/2018   Complex sleep apnea syndrome 04/01/2018   Snoring 12/21/2016   UARS (upper airway resistance syndrome) 12/21/2016   HNP (herniated nucleus pulposus), cervical 06/28/2016   Spinal stenosis of cervical region 05/02/2016   Primary osteoarthritis, right shoulder 05/02/2016   OSA (obstructive sleep apnea) 12/01/2013   ERRONEOUS ENCOUNTER--DISREGARD 10/27/2013   Hypersomnia, persistent 10/03/2013   Past Medical History:  Diagnosis Date   Anemia    as a child   Anxiety    Arthritis    Depression    takes Wellbutrin daily   Hyperlipidemia    takes  Simvastatin daily   Hypertension    takes Metoprolol and Hyzaar daily    Muscle spasm    takes Robaxin as needed   Nerve pain    takes Gabapentin as needed   OSA (obstructive sleep apnea) 12/01/2013   16.6 AHI and RDI of over 30. REM AHI over 30.     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 and states it is very well controlled   Urinary frequency    Weakness    numbness and tingling in right arm down to fingers   Family History  Problem Relation Age of Onset   Lymphoma Mother    Lung disease Father    Parkinson's disease Father    Hypertension Father    Hypertension Sister    Hyperlipidemia Brother    Hypertension Brother    Stroke Maternal Grandmother    Ulcerative colitis Daughter    Past Surgical History:  Procedure Laterality Date   ANKLE ARTHROPLASTY Right    ANTERIOR CERVICAL DECOMP/DISCECTOMY FUSION N/A 06/28/2016   Procedure: ANTERIOR CERVICAL DECOMPRESSION FUSION CERVICAL THREE-FOUR ,CERVICAL FOUR-FIVE,CERVICAL FIVE-SIX;  Surgeon: Jovita Gamma, MD;  Location:  [facility];  Service: Neurosurgery;  Laterality: N/A;   APPENDECTOMY     COLONOSCOPY     lap inguinal hernia repair wiht mesh Bilateral 10/2003   REFRACTIVE SURGERY  2003   TONSILLECTOMY     wisdom teeth extracted     Social History   Occupational History    Employer: [employer]   Occupation: retired Chief Financial Officer  Tobacco Use   Smoking status: Never   Smokeless tobacco: Never  Vaping Use   Vaping Use: Never used  Substance and Sexual Activity   Alcohol use: Yes    Comment: .5 per day   Drug use: No   Sexual activity: Not on file

## 2021-11-08 NOTE — Progress Notes (Unsigned)
Numeric Pain Rating Scale and Functional Assessment Average Pain 4   In the last MONTH (on 0-10 scale) has pain interfered with the following?  1. General activity like being  able to carry out your everyday physical activities such as walking, climbing stairs, carrying groceries, or moving a chair?  Rating(8)   +Driver, -BT, -Dye Allergies.   MRI Lsp review.

## 2021-11-10 ENCOUNTER — Telehealth: Payer: Self-pay | Admitting: Physical Medicine and Rehabilitation

## 2021-11-10 ENCOUNTER — Other Ambulatory Visit (HOSPITAL_COMMUNITY): Payer: Self-pay

## 2021-11-10 MED ORDER — AMLODIPINE BESYLATE 5 MG PO TABS
5.0000 mg | ORAL_TABLET | Freq: Every day | ORAL | 3 refills | Status: DC
  Filled 2021-11-10: qty 90, 90d supply, fill #0

## 2021-11-10 NOTE — Telephone Encounter (Signed)
scheduled

## 2021-11-10 NOTE — Telephone Encounter (Signed)
Patient returned call needing to schedule an appointment with Dr. Ernestina Patches. Ph# [phone]

## 2021-11-17 ENCOUNTER — Ambulatory Visit: Payer: Self-pay

## 2021-11-17 ENCOUNTER — Ambulatory Visit (INDEPENDENT_AMBULATORY_CARE_PROVIDER_SITE_OTHER): Payer: Medicare Other | Admitting: Physical Medicine and Rehabilitation

## 2021-11-17 VITALS — BP 125/82 | HR 62

## 2021-11-17 DIAGNOSIS — M5416 Radiculopathy, lumbar region: Secondary | ICD-10-CM | POA: Diagnosis not present

## 2021-11-17 MED ORDER — METHYLPREDNISOLONE ACETATE 80 MG/ML IJ SUSP
40.0000 mg | Freq: Once | INTRAMUSCULAR | Status: AC
  Administered 2021-11-17: 40 mg

## 2021-11-17 NOTE — Progress Notes (Signed)
Numeric Pain Rating Scale and Functional Assessment Average Pain 1   In the last MONTH (on 0-10 scale) has pain interfered with the following?  1. General activity like being  able to carry out your everyday physical activities such as walking, climbing stairs, carrying groceries, or moving a chair?  Rating(8)   +Driver, -BT, -Dye Allergies.  Spasms come and go. Pain radiates down left leg, stays in ankle and foot on both sides. Gabapentin and Advil PRN for pain

## 2021-11-17 NOTE — Patient Instructions (Signed)

## 2021-11-19 ENCOUNTER — Other Ambulatory Visit (HOSPITAL_COMMUNITY): Payer: Self-pay

## 2021-11-21 ENCOUNTER — Other Ambulatory Visit (HOSPITAL_COMMUNITY): Payer: Self-pay

## 2021-11-21 MED ORDER — SIMVASTATIN 40 MG PO TABS
40.0000 mg | ORAL_TABLET | Freq: Every day | ORAL | 3 refills | Status: DC
  Filled 2021-11-21: qty 90, 90d supply, fill #0
  Filled 2022-03-17: qty 90, 90d supply, fill #1
  Filled 2022-09-07 – 2022-09-08 (×2): qty 90, 90d supply, fill #2

## 2021-11-28 NOTE — Procedures (Signed)
Lumbar Epidural Steroid Injection - Interlaminar Approach with Fluoroscopic Guidance  Patient: Joshua [name]      Date of Birth: [birth year] MRN: [id number] PCP: [name], MD      Visit Date: 11/17/2021   Universal Protocol:     Consent Given By: the patient  Position: PRONE  Additional Comments: Vital signs were monitored before and after the procedure. Patient was prepped and draped in the usual sterile fashion. The correct patient, procedure, and site was verified.   Injection Procedure Details:   Procedure diagnoses: Lumbar radiculopathy [M54.16]   Meds Administered:  Meds ordered this encounter  Medications   methylPREDNISolone acetate (DEPO-MEDROL) injection 40 mg     Laterality: Left  Location/Site:  L5-S1  Needle: 3.5 in., 20 ga. Tuohy  Needle Placement: Paramedian epidural  Findings:   -Comments: Excellent flow of contrast into the epidural space.  Procedure Details: Using a paramedian approach from the side mentioned above, the region overlying the inferior lamina was localized under fluoroscopic visualization and the soft tissues overlying this structure were infiltrated with 4 ml. of 1% Lidocaine without Epinephrine. The Tuohy needle was inserted into the epidural space using a paramedian approach.   The epidural space was localized using loss of resistance along with counter oblique bi-planar fluoroscopic views.  After negative aspirate for air, blood, and CSF, a 2 ml. volume of Isovue-250 was injected into the epidural space and the flow of contrast was observed. Radiographs were obtained for documentation purposes.    The injectate was administered into the level noted above.   Additional Comments:  The patient tolerated the procedure well Dressing: 2 x 2 sterile gauze and Band-Aid    Post-procedure details: Patient was observed during the procedure. Post-procedure instructions were reviewed.  Patient left the clinic in stable  condition.

## 2021-11-28 NOTE — Progress Notes (Signed)
Joshua [name] - 71 y.o. male MRN [id number]  Date of birth: Sep 01, 1950  Office Visit Note: Visit Date: 11/17/2021 PCP: [name], MD Referred by: [name], MD  Subjective: Chief Complaint  Patient presents with   Lower Back - Pain   HPI:  Joshua [name] is a 71 y.o. male who comes in today at the request of Joshua Pall, FNP for planned Left L5-S1 Lumbar Interlaminar epidural steroid injection with fluoroscopic guidance.  The patient has failed conservative care including home exercise, medications, time and activity modification.  This injection will be diagnostic and hopefully therapeutic.  Please see requesting physician notes for further details and justification.    ROS Otherwise per HPI.  Assessment & Plan: Visit Diagnoses:    ICD-10-CM   1. Lumbar radiculopathy  M54.16 XR C-ARM NO REPORT    Epidural Steroid injection    methylPREDNISolone acetate (DEPO-MEDROL) injection 40 mg      Plan: No additional findings.   Meds & Orders:  Meds ordered this encounter  Medications   methylPREDNISolone acetate (DEPO-MEDROL) injection 40 mg    Orders Placed This Encounter  Procedures   XR C-ARM NO REPORT   Epidural Steroid injection    Follow-up: Return for visit to requesting provider as needed.   Procedures: No procedures performed  Lumbar Epidural Steroid Injection - Interlaminar Approach with Fluoroscopic Guidance  Patient: Joshua [name]      Date of Birth: 05/17/1950 MRN: [id number] PCP: [name], MD      Visit Date: 11/17/2021   Universal Protocol:     Consent Given By: the patient  Position: PRONE  Additional Comments: Vital signs were monitored before and after the procedure. Patient was prepped and draped in the usual sterile fashion. The correct patient, procedure, and site was verified.   Injection Procedure Details:   Procedure diagnoses: Lumbar radiculopathy [M54.16]   Meds Administered:  Meds ordered this  encounter  Medications   methylPREDNISolone acetate (DEPO-MEDROL) injection 40 mg     Laterality: Left  Location/Site:  L5-S1  Needle: 3.5 in., 20 ga. Tuohy  Needle Placement: Paramedian epidural  Findings:   -Comments: Excellent flow of contrast into the epidural space.  Procedure Details: Using a paramedian approach from the side mentioned above, the region overlying the inferior lamina was localized under fluoroscopic visualization and the soft tissues overlying this structure were infiltrated with 4 ml. of 1% Lidocaine without Epinephrine. The Tuohy needle was inserted into the epidural space using a paramedian approach.   The epidural space was localized using loss of resistance along with counter oblique bi-planar fluoroscopic views.  After negative aspirate for air, blood, and CSF, a 2 ml. volume of Isovue-250 was injected into the epidural space and the flow of contrast was observed. Radiographs were obtained for documentation purposes.    The injectate was administered into the level noted above.   Additional Comments:  The patient tolerated the procedure well Dressing: 2 x 2 sterile gauze and Band-Aid    Post-procedure details: Patient was observed during the procedure. Post-procedure instructions were reviewed.  Patient left the clinic in stable condition.   Clinical History: EXAM: MRI LUMBAR SPINE WITHOUT CONTRAST   TECHNIQUE: Multiplanar, multisequence MR imaging of the lumbar spine was performed. No intravenous contrast was administered.   COMPARISON:  Ultrasound of the abdominal aorta 12/21/2017, radiograph 08/13/2015 and lumbar MRI 04/05/2012.   FINDINGS: Segmentation: Conventional anatomy assumed, with the last open disc space designated L5-S1.Concordant with previous  imaging.   Alignment: Convex left scoliosis centered at L2. At L4-5, there is 11 mm of anterolisthesis secondary to chronic bilateral L4 pars defects.   Vertebrae: As above, chronic  bilateral L4 pars defects with associated chronic endplate degenerative changes. There are progressive endplate degenerative changes at L2-3 which are asymmetric to the right. No evidence of acute fracture or worrisome osseous lesion. The visualized sacroiliac joints appear unremarkable.   Conus medullaris: Extends to the L1 level and appears normal.   Paraspinal and other soft tissues: No significant paraspinal findings. Fusiform dilatation of the abdominal aorta has increased from previous remote MRI with an AP diameter of up to 3.6 cm.   Disc levels:   Mild disc bulging and facet hypertrophy at T11-12 and T12-L1 without resulting spinal stenosis or nerve root encroachment.   L1-2: Chronic loss of disc height with annular disc bulging and endplate osteophytes. Mild facet hypertrophy. Mild narrowing of the lateral recesses and foramina without definite nerve root encroachment.   L2-3: Progressive loss of disc height with annular disc bulging and endplate osteophytes. Mild facet and ligamentous hypertrophy. Resulting mild spinal stenosis with asymmetric left lateral recess narrowing. Both foramina are mildly narrowed.   L3-4: Disc height and hydration are relatively preserved. Stable mild disc bulging with mildly progressive facet and ligamentous hypertrophy. Borderline spinal stenosis without nerve root encroachment.   L4-5: As above, chronic bilateral pars defects with chronic grade 1 anterolisthesis and chronic loss of disc height. There is mild facet and ligamentous hypertrophy. Resulting chronic mild to moderate multifactorial spinal stenosis with asymmetric lateral recess narrowing on the left. Chronic moderate to severe foraminal narrowing bilaterally which could affect either L4 nerve root.   L5-S1: Mildly progressive disc bulging and endplate osteophyte formation asymmetric to the left. Mild bilateral facet hypertrophy. Mild spinal stenosis with mild lateral recess  and foraminal narrowing bilaterally.   IMPRESSION: 1. Compared with previous MRI from 2014, no acute findings or clear explanation for the patient's symptoms. 2. Chronic grade 1 anterolisthesis at L4-5 secondary to chronic bilateral L4 pars defects. Resulting chronic mild to moderate multifactorial spinal stenosis with asymmetric lateral recess narrowing on the left. Chronic moderate to severe foraminal narrowing bilaterally could affect either L4 nerve root. 3. Mildly progressive spondylosis at L2-3 and L5-S1 without resulting high-grade spinal stenosis or definite nerve root encroachment. 4. Fusiform dilatation of the abdominal aorta has increased from remote lumbar MRI in 2014, now measuring up to 3.6 cm in AP diameter. The aorta measured up to 3.3 x 3.5 cm on aortic ultrasound performed 4 years ago. Recommend follow-up ultrasound every 2 years. Reference: J Am Coll Radiol 1749;[id number].     Electronically Signed   By: Richardean Sale M.D.   On: 11/08/2021 13:37     Objective:  VS:  HT:    WT:   BMI:     BP:125/82  HR:62bpm  TEMP: ( )  RESP:  Physical Exam Vitals and nursing note reviewed.  Constitutional:      General: He is not in acute distress.    Appearance: Normal appearance. He is not ill-appearing.  HENT:     Head: Normocephalic and atraumatic.     Right Ear: External ear normal.     Left Ear: External ear normal.     Nose: No congestion.  Eyes:     Extraocular Movements: Extraocular movements intact.  Cardiovascular:     Rate and Rhythm: Normal rate.     Pulses: Normal pulses.  Pulmonary:  Effort: Pulmonary effort is normal. No respiratory distress.  Abdominal:     General: There is no distension.     Palpations: Abdomen is soft.  Musculoskeletal:        General: No tenderness or signs of injury.     Cervical back: Neck supple.     Right lower leg: No edema.     Left lower leg: No edema.     Comments: Patient has good distal strength without  clonus.  Skin:    Findings: No erythema or rash.  Neurological:     General: No focal deficit present.     Mental Status: He is alert and oriented to person, place, and time.     Sensory: No sensory deficit.     Motor: No weakness or abnormal muscle tone.     Coordination: Coordination normal.  Psychiatric:        Mood and Affect: Mood normal.        Behavior: Behavior normal.      Imaging: No results found.

## 2021-11-30 ENCOUNTER — Other Ambulatory Visit (HOSPITAL_COMMUNITY): Payer: Self-pay

## 2021-12-07 ENCOUNTER — Telehealth: Payer: Self-pay | Admitting: Physical Medicine and Rehabilitation

## 2021-12-07 NOTE — Telephone Encounter (Signed)
Received call from patient. He would like copy of last ov note with Dr. Ernestina Patches sent to him and also faxed to another doctor. He has appt. This week. Verbal Josem Kaufmann accepted. Copy of 11/17/21 faxed [phone], Attn: Nadine. Also, mailed pt copy of ov note. Pts ph [phone]

## 2021-12-09 DIAGNOSIS — M4125 Other idiopathic scoliosis, thoracolumbar region: Secondary | ICD-10-CM | POA: Diagnosis not present

## 2021-12-09 DIAGNOSIS — M79605 Pain in left leg: Secondary | ICD-10-CM | POA: Diagnosis not present

## 2021-12-09 DIAGNOSIS — M48062 Spinal stenosis, lumbar region with neurogenic claudication: Secondary | ICD-10-CM | POA: Diagnosis not present

## 2021-12-09 DIAGNOSIS — M5136 Other intervertebral disc degeneration, lumbar region: Secondary | ICD-10-CM | POA: Diagnosis not present

## 2021-12-09 DIAGNOSIS — M4316 Spondylolisthesis, lumbar region: Secondary | ICD-10-CM | POA: Diagnosis not present

## 2021-12-09 DIAGNOSIS — M4306 Spondylolysis, lumbar region: Secondary | ICD-10-CM | POA: Diagnosis not present

## 2021-12-09 DIAGNOSIS — M8938 Hypertrophy of bone, other site: Secondary | ICD-10-CM | POA: Diagnosis not present

## 2021-12-12 ENCOUNTER — Other Ambulatory Visit: Payer: Self-pay | Admitting: Orthopedic Surgery

## 2021-12-12 ENCOUNTER — Other Ambulatory Visit (HOSPITAL_COMMUNITY): Payer: Self-pay

## 2021-12-12 DIAGNOSIS — Z13820 Encounter for screening for osteoporosis: Secondary | ICD-10-CM

## 2021-12-12 DIAGNOSIS — M48062 Spinal stenosis, lumbar region with neurogenic claudication: Secondary | ICD-10-CM

## 2021-12-12 DIAGNOSIS — M5136 Other intervertebral disc degeneration, lumbar region: Secondary | ICD-10-CM

## 2021-12-12 DIAGNOSIS — M4306 Spondylolysis, lumbar region: Secondary | ICD-10-CM

## 2021-12-12 DIAGNOSIS — M8938 Hypertrophy of bone, other site: Secondary | ICD-10-CM

## 2021-12-12 DIAGNOSIS — M79605 Pain in left leg: Secondary | ICD-10-CM

## 2021-12-12 DIAGNOSIS — M4316 Spondylolisthesis, lumbar region: Secondary | ICD-10-CM

## 2021-12-12 DIAGNOSIS — M4125 Other idiopathic scoliosis, thoracolumbar region: Secondary | ICD-10-CM

## 2021-12-12 MED ORDER — METHYLPHENIDATE HCL ER 20 MG PO TBCR
20.0000 mg | EXTENDED_RELEASE_TABLET | Freq: Every morning | ORAL | 0 refills | Status: DC
  Filled 2021-12-12: qty 30, 30d supply, fill #0

## 2021-12-13 ENCOUNTER — Other Ambulatory Visit (HOSPITAL_COMMUNITY): Payer: Self-pay

## 2021-12-14 ENCOUNTER — Ambulatory Visit
Admission: RE | Admit: 2021-12-14 | Discharge: 2021-12-14 | Disposition: A | Discharge status: Home / Self Care | Payer: Medicare Other | Source: Ambulatory Visit | Attending: Orthopedic Surgery | Admitting: Orthopedic Surgery

## 2021-12-14 DIAGNOSIS — M79605 Pain in left leg: Secondary | ICD-10-CM

## 2021-12-14 DIAGNOSIS — M4125 Other idiopathic scoliosis, thoracolumbar region: Secondary | ICD-10-CM

## 2021-12-14 DIAGNOSIS — M545 Low back pain, unspecified: Secondary | ICD-10-CM | POA: Diagnosis not present

## 2021-12-14 DIAGNOSIS — M4316 Spondylolisthesis, lumbar region: Secondary | ICD-10-CM | POA: Diagnosis not present

## 2021-12-14 DIAGNOSIS — M8938 Hypertrophy of bone, other site: Secondary | ICD-10-CM

## 2021-12-14 DIAGNOSIS — M5136 Other intervertebral disc degeneration, lumbar region: Secondary | ICD-10-CM

## 2021-12-14 DIAGNOSIS — M4306 Spondylolysis, lumbar region: Secondary | ICD-10-CM

## 2021-12-14 DIAGNOSIS — M48062 Spinal stenosis, lumbar region with neurogenic claudication: Secondary | ICD-10-CM

## 2021-12-14 DIAGNOSIS — M51369 Other intervertebral disc degeneration, lumbar region without mention of lumbar back pain or lower extremity pain: Secondary | ICD-10-CM

## 2021-12-20 ENCOUNTER — Telehealth: Payer: Self-pay | Admitting: Physical Medicine and Rehabilitation

## 2021-12-20 NOTE — Telephone Encounter (Signed)
Patient called. Would like an appointment with Dr. Ernestina Patches. Cb # [phone]

## 2021-12-20 NOTE — Telephone Encounter (Signed)
Pt called requesting a call back to set an appt. Pt phone number is [phone].

## 2021-12-21 ENCOUNTER — Other Ambulatory Visit: Payer: Self-pay | Admitting: Physical Medicine and Rehabilitation

## 2021-12-21 ENCOUNTER — Telehealth: Payer: Self-pay | Admitting: Physical Medicine and Rehabilitation

## 2021-12-21 ENCOUNTER — Other Ambulatory Visit (HOSPITAL_COMMUNITY): Payer: Self-pay

## 2021-12-21 DIAGNOSIS — M5416 Radiculopathy, lumbar region: Secondary | ICD-10-CM

## 2021-12-21 MED ORDER — GABAPENTIN 300 MG PO CAPS
300.0000 mg | ORAL_CAPSULE | Freq: Every day | ORAL | 3 refills | Status: AC | PRN
  Filled 2021-12-21: qty 90, 90d supply, fill #0

## 2021-12-21 NOTE — Telephone Encounter (Signed)
I left voicemail for patient advising we are going to try different injection. Advised order has been entered and someone will call him to schedule once insurance authorization is received.

## 2021-12-21 NOTE — Telephone Encounter (Signed)
Pt called  See other note

## 2021-12-21 NOTE — Telephone Encounter (Signed)
Pt called to set an appt. Pt states he called yesterday and waiting for a call to set an appt. Please call pt at [phone].

## 2021-12-23 NOTE — Telephone Encounter (Signed)
Patient called in wondering if Insurance Auth has went through so he can get scheduled for injection please advise

## 2021-12-26 ENCOUNTER — Other Ambulatory Visit: Payer: Self-pay | Admitting: Physical Medicine and Rehabilitation

## 2021-12-26 DIAGNOSIS — M5416 Radiculopathy, lumbar region: Secondary | ICD-10-CM

## 2021-12-26 NOTE — Telephone Encounter (Signed)
Patient called again about scheduling. I scheduled him for 01/11/2022 at 1:30pm.  He would like to know if you have a wait list so that he can be seen sooner? Advised that we did not think so, but told him that we would send the message to you and ask. Patient requests return call if he can be seen sooner. Thanks.

## 2021-12-26 NOTE — Telephone Encounter (Signed)
Called pt back to advise no waiting list, per Dr. Ernestina Patches pt can check with [facility] to see if he can get in sooner. Pt asked multiple time of a waiting list & advised there is no waiting list that Dr. Ernestina Patches is off a few days between now & 01/11/22, advised pt to call back if he got in sooner at [facility], then pt hung up phone.

## 2021-12-27 DIAGNOSIS — E291 Testicular hypofunction: Secondary | ICD-10-CM | POA: Diagnosis not present

## 2021-12-27 DIAGNOSIS — Z1212 Encounter for screening for malignant neoplasm of rectum: Secondary | ICD-10-CM | POA: Diagnosis not present

## 2021-12-28 ENCOUNTER — Ambulatory Visit
Admission: RE | Admit: 2021-12-28 | Discharge: 2021-12-28 | Disposition: A | Discharge status: Home / Self Care | Payer: Medicare Other | Source: Ambulatory Visit | Attending: Physical Medicine and Rehabilitation | Admitting: Physical Medicine and Rehabilitation

## 2021-12-28 DIAGNOSIS — M4727 Other spondylosis with radiculopathy, lumbosacral region: Secondary | ICD-10-CM | POA: Diagnosis not present

## 2021-12-28 DIAGNOSIS — M5416 Radiculopathy, lumbar region: Secondary | ICD-10-CM

## 2021-12-28 MED ORDER — METHYLPREDNISOLONE ACETATE 40 MG/ML INJ SUSP (RADIOLOG
80.0000 mg | Freq: Once | INTRAMUSCULAR | Status: AC
  Administered 2021-12-28: 80 mg via EPIDURAL

## 2021-12-28 MED ORDER — IOPAMIDOL (ISOVUE-M 200) INJECTION 41%
1.0000 mL | Freq: Once | INTRAMUSCULAR | Status: AC
  Administered 2021-12-28: 1 mL via EPIDURAL

## 2021-12-28 NOTE — Telephone Encounter (Signed)
Patient called in to cancel appt on 12/06 being he already got the injection elsewhere.

## 2021-12-28 NOTE — Discharge Instructions (Signed)

## 2021-12-30 DIAGNOSIS — Z125 Encounter for screening for malignant neoplasm of prostate: Secondary | ICD-10-CM | POA: Diagnosis not present

## 2021-12-30 DIAGNOSIS — I10 Essential (primary) hypertension: Secondary | ICD-10-CM | POA: Diagnosis not present

## 2021-12-30 DIAGNOSIS — E785 Hyperlipidemia, unspecified: Secondary | ICD-10-CM | POA: Diagnosis not present

## 2021-12-30 DIAGNOSIS — R5383 Other fatigue: Secondary | ICD-10-CM | POA: Diagnosis not present

## 2022-01-03 DIAGNOSIS — M25562 Pain in left knee: Secondary | ICD-10-CM | POA: Diagnosis not present

## 2022-01-03 DIAGNOSIS — R82998 Other abnormal findings in urine: Secondary | ICD-10-CM | POA: Diagnosis not present

## 2022-01-03 DIAGNOSIS — Z0000 Encounter for general adult medical examination without abnormal findings: Secondary | ICD-10-CM | POA: Diagnosis not present

## 2022-01-03 DIAGNOSIS — F900 Attention-deficit hyperactivity disorder, predominantly inattentive type: Secondary | ICD-10-CM | POA: Diagnosis not present

## 2022-01-03 DIAGNOSIS — E785 Hyperlipidemia, unspecified: Secondary | ICD-10-CM | POA: Diagnosis not present

## 2022-01-03 DIAGNOSIS — G4733 Obstructive sleep apnea (adult) (pediatric): Secondary | ICD-10-CM | POA: Diagnosis not present

## 2022-01-03 DIAGNOSIS — I7143 Infrarenal abdominal aortic aneurysm, without rupture: Secondary | ICD-10-CM | POA: Diagnosis not present

## 2022-01-03 DIAGNOSIS — I10 Essential (primary) hypertension: Secondary | ICD-10-CM | POA: Diagnosis not present

## 2022-01-03 DIAGNOSIS — E291 Testicular hypofunction: Secondary | ICD-10-CM | POA: Diagnosis not present

## 2022-01-03 DIAGNOSIS — Z1212 Encounter for screening for malignant neoplasm of rectum: Secondary | ICD-10-CM | POA: Diagnosis not present

## 2022-01-10 ENCOUNTER — Other Ambulatory Visit: Payer: Self-pay | Admitting: Internal Medicine

## 2022-01-10 DIAGNOSIS — I7143 Infrarenal abdominal aortic aneurysm, without rupture: Secondary | ICD-10-CM

## 2022-01-11 ENCOUNTER — Other Ambulatory Visit (HOSPITAL_COMMUNITY): Payer: Self-pay

## 2022-01-11 ENCOUNTER — Encounter: Payer: Medicare Other | Admitting: Physical Medicine and Rehabilitation

## 2022-01-11 MED ORDER — TESTOSTERONE CYPIONATE 200 MG/ML IM SOLN
200.0000 mg | INTRAMUSCULAR | 0 refills | Status: DC
  Filled 2022-01-11: qty 3, 63d supply, fill #0

## 2022-01-14 ENCOUNTER — Other Ambulatory Visit (HOSPITAL_COMMUNITY): Payer: Self-pay

## 2022-01-16 ENCOUNTER — Other Ambulatory Visit (HOSPITAL_COMMUNITY): Payer: Self-pay

## 2022-01-16 MED ORDER — BUPROPION HCL ER (SR) 150 MG PO TB12
150.0000 mg | ORAL_TABLET | Freq: Two times a day (BID) | ORAL | 3 refills | Status: AC
  Filled 2022-01-16: qty 180, 90d supply, fill #0
  Filled 2022-05-13: qty 180, 90d supply, fill #1
  Filled 2022-11-09: qty 180, 90d supply, fill #2

## 2022-01-21 ENCOUNTER — Other Ambulatory Visit (HOSPITAL_COMMUNITY): Payer: Self-pay

## 2022-01-24 ENCOUNTER — Other Ambulatory Visit (HOSPITAL_COMMUNITY): Payer: Self-pay

## 2022-01-24 MED ORDER — METHYLPHENIDATE HCL ER 20 MG PO TBCR
20.0000 mg | EXTENDED_RELEASE_TABLET | Freq: Every morning | ORAL | 0 refills | Status: DC
  Filled 2022-01-24: qty 30, 30d supply, fill #0

## 2022-02-02 ENCOUNTER — Other Ambulatory Visit: Payer: Self-pay

## 2022-02-02 ENCOUNTER — Other Ambulatory Visit (HOSPITAL_COMMUNITY): Payer: Self-pay

## 2022-02-02 MED ORDER — TAMSULOSIN HCL 0.4 MG PO CAPS
0.4000 mg | ORAL_CAPSULE | Freq: Every day | ORAL | 3 refills | Status: AC
  Filled 2022-02-02: qty 90, 90d supply, fill #0
  Filled 2022-05-13: qty 90, 90d supply, fill #1
  Filled 2022-09-07: qty 90, 90d supply, fill #2
  Filled 2022-11-29: qty 90, 90d supply, fill #3

## 2022-02-02 MED ORDER — LOSARTAN POTASSIUM 50 MG PO TABS
50.0000 mg | ORAL_TABLET | Freq: Every day | ORAL | 4 refills | Status: AC
  Filled 2022-02-02: qty 90, 90d supply, fill #0
  Filled 2022-09-07: qty 90, 90d supply, fill #1
  Filled 2022-11-29: qty 90, 90d supply, fill #2

## 2022-02-09 ENCOUNTER — Encounter (HOSPITAL_COMMUNITY): Admission: RE | Admit: 2022-02-09 | Payer: Medicare Other | Source: Ambulatory Visit

## 2022-02-09 DIAGNOSIS — M48062 Spinal stenosis, lumbar region with neurogenic claudication: Secondary | ICD-10-CM | POA: Diagnosis not present

## 2022-02-09 DIAGNOSIS — Z79899 Other long term (current) drug therapy: Secondary | ICD-10-CM | POA: Diagnosis not present

## 2022-02-15 DIAGNOSIS — I493 Ventricular premature depolarization: Secondary | ICD-10-CM | POA: Diagnosis not present

## 2022-02-15 DIAGNOSIS — M5416 Radiculopathy, lumbar region: Secondary | ICD-10-CM | POA: Diagnosis not present

## 2022-02-15 DIAGNOSIS — Z01818 Encounter for other preprocedural examination: Secondary | ICD-10-CM | POA: Diagnosis not present

## 2022-02-15 DIAGNOSIS — I10 Essential (primary) hypertension: Secondary | ICD-10-CM | POA: Diagnosis not present

## 2022-02-16 DIAGNOSIS — Z01810 Encounter for preprocedural cardiovascular examination: Secondary | ICD-10-CM | POA: Insufficient documentation

## 2022-02-16 NOTE — Progress Notes (Signed)
Patient referred by [name], MD for preop evaluation  Subjective:   Joshua [name], male    DOB: 07-07-1950, 72 y.o.   MRN: [id number]   Chief Complaint  Patient presents with   Abnormal ECG   New Patient (Initial Visit)   surgical clearance     HPI  72 y.o. Caucasian male with hypertension, hyperlipidemia, OSA, depression, referred for pre-op evaluation.  Patient is scheduled ot undergo extensive spine surgery by Dr. Marykay Lex in [place].He is a retired Land. He had been active with regular exercise and activity until recently, Even now, he is able to walk a flight of stairs. He denies chest pain, shortness of breath, palpitations, leg edema, orthopnea, PND, TIA/syncope. Blood pressure is well controlled.   Past Medical History:  Diagnosis Date   Anemia    as a child   Anxiety    Arthritis    Depression    takes Wellbutrin daily   Hyperlipidemia    takes Simvastatin daily   Hypertension    takes Metoprolol and Hyzaar daily    Muscle spasm    takes Robaxin as needed   Nerve pain    takes Gabapentin as needed   OSA (obstructive sleep apnea) 12/01/2013   16.6 AHI and RDI of over 30. REM AHI over 30.     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 and states it is very well controlled   Urinary frequency    Weakness    numbness and tingling in right arm down to fingers     Past Surgical History:  Procedure Laterality Date   ANKLE ARTHROPLASTY Right    ANTERIOR CERVICAL DECOMP/DISCECTOMY FUSION N/A 06/28/2016   Procedure: ANTERIOR CERVICAL DECOMPRESSION FUSION CERVICAL THREE-FOUR ,CERVICAL FOUR-FIVE,CERVICAL FIVE-SIX;  Surgeon: Jovita Gamma, MD;  Location: [place];  Service: Neurosurgery;  Laterality: N/A;   APPENDECTOMY     COLONOSCOPY     lap inguinal hernia repair wiht mesh Bilateral 10/2003   REFRACTIVE SURGERY  2003   TONSILLECTOMY     wisdom teeth extracted       Social History   Tobacco Use  Smoking Status Never  Smokeless  Tobacco Never    Social History   Substance and Sexual Activity  Alcohol Use Yes   Comment: .5 per day     Family History  Problem Relation Age of Onset   Lymphoma Mother    Lung disease Father    Parkinson's disease Father    Hypertension Father    Hypertension Sister    Hyperlipidemia Brother    Hypertension Brother    Stroke Maternal Grandmother    Ulcerative colitis Daughter       Current Outpatient Medications:    amLODipine (NORVASC) 5 MG tablet, Take 1 tablet (5 mg total) by mouth daily as directed., Disp: 90 tablet, Rfl: 3   ascorbic acid (VITAMIN C) 1000 MG tablet, Take 1 tablet by mouth. Twice a week, Disp: , Rfl:    buPROPion (WELLBUTRIN SR) 150 MG 12 hr tablet, Take 1 tablet (150 mg total) by mouth 2 (two) times daily., Disp: 180 tablet, Rfl: 3   ezetimibe (ZETIA) 10 MG tablet, TAKE 1 TABLET BY MOUTH ONCE A DAY, Disp: 90 tablet, Rfl: 1   famciclovir (FAMVIR) 125 MG tablet, Take 1 tablet by mouth 2 times daily., Disp: 180 tablet, Rfl: 3   famotidine (PEPCID) 20 MG tablet, TAKE 1 TABLET BY MOUTH ONCE DAILY AS NEEDED FOR HEART BURN, Disp: 90 tablet,  Rfl: 4   gabapentin (NEURONTIN) 300 MG capsule, Take 1 capsule (300 mg total) by mouth daily as needed for back pain, Disp: 90 capsule, Rfl: 3   losartan (COZAAR) 50 MG tablet, Take 1 tablet (50 mg total) by mouth daily., Disp: 90 tablet, Rfl: 4   methylphenidate (METADATE ER) 20 MG ER tablet, Take 20 mg by mouth every morning., Disp: , Rfl:    metoprolol succinate (TOPROL-XL) 25 MG 24 hr tablet, TAKE 1 TABLET BY MOUTH ONCE A DAY, Disp: 90 tablet, Rfl: 3   Multiple Vitamin (MULTIVITAMINS PO), Take 1 tablet by mouth daily., Disp: , Rfl:    naproxen sodium (ALEVE) 220 MG tablet, Take 220 mg by mouth daily as needed., Disp: , Rfl:    simvastatin (ZOCOR) 40 MG tablet, Take 1 tablet (40 mg total) by mouth daily., Disp: 90 tablet, Rfl: 3   SYRINGE-NEEDLE, DISP, 3 ML (B-D 3CC LUER-LOK SYR 18GX1-1/2) 18G X 1-1/2" 3 ML MISC, Use as  directed with Testosterone, Disp: 20 each, Rfl: 0   tamsulosin (FLOMAX) 0.4 MG CAPS capsule, Take 1 capsule (0.4 mg total) by mouth daily., Disp: 90 capsule, Rfl: 3   testosterone cypionate (DEPOTESTOSTERONE CYPIONATE) 200 MG/ML injection, Inject 1 mL (200 mg total) into the muscle every 21 ( twenty-one) days., Disp: 3 mL, Rfl: 0   Vitamin E 268 MG (400 UNIT) CAPS, Take 1 tablet by mouth once a week., Disp: , Rfl:    Cardiovascular and other pertinent studies:  Reviewed external labs and tests, independently interpreted  EKG 02/17/2022: Sinus rhythm 58 bpm Rightward axis First degree A-V block   Echocardiogram 02/17/2022: Left ventricle cavity is normal in size and wall thickness. Normal global wall motion. Normal LV systolic function with EF 53%. Doppler evidence of grade I (impaired) diastolic dysfunction, normal LAP.  Trileaflet aortic valve. Moderate aortic valve leaflet calcification. Aortic sclerosis. Trace aortic regurgitation. Mild to moderate mitral regurgitation. Mild tricuspid regurgitation. Mild pulmonic regurgitation. No evidence of pulmonary hypertension.   Recent labs: 12/30/2021: Glucose 86, BUN/Cr 18/0.9. EGFR 83.7. Na/K 137/3.8.  Total Protien 5.9, ALT: 61 H/H 15.6/47.4. MCV 94.1. Platelets 233 Chol 143, TG 64, HDL 56, LDL 74   Review of Systems  Cardiovascular:  Negative for chest pain, dyspnea on exertion, leg swelling, palpitations and syncope.  Musculoskeletal:  Positive for back pain and joint pain.         Vitals:   02/17/22 0834 02/17/22 0839  BP: (!) 153/85 132/80  Pulse: 61 63  SpO2: 98%      Body mass index is 22.44 kg/m. Filed Weights   02/17/22 0834  Weight: 139 lb (63 kg)     Objective:   Physical Exam Vitals and nursing note reviewed.  Constitutional:      General: He is not in acute distress. Neck:     Vascular: No JVD.  Cardiovascular:     Rate and Rhythm: Normal rate and regular rhythm.     Heart sounds: Normal heart  sounds. No murmur heard. Pulmonary:     Effort: Pulmonary effort is normal.     Breath sounds: Normal breath sounds. No wheezing or rales.  Musculoskeletal:     Right lower leg: No edema.     Left lower leg: No edema.          Visit diagnoses:   ICD-10-CM   1. Preop cardiovascular exam  Z01.810 EKG 12-Lead    PCV ECHOCARDIOGRAM COMPLETE    2. Murmur  R01.1 PCV ECHOCARDIOGRAM COMPLETE  3. Nonrheumatic mitral valve regurgitation  I34.0     4. Primary hypertension  I10        Orders Placed This Encounter  Procedures   EKG 12-Lead   PCV ECHOCARDIOGRAM COMPLETE      Assessment & Recommendations:   72 y.o. Caucasian male with hypertension, hyperlipidemia, OSA, depression, referred for pre-op evaluation.  Pre-op evaluation: Baseline function of ICD.  Unremarkable resting EKG.  Echocardiogram with moderate regurgitation, aortic sclerosis, clinically nonsignificant.  Overall, low perioperative cardiac risk.  Aortic stenosis, mild to moderate MR: Recommend repeat echocardiogram in 2 years for surveillance.  Hypertension: Well-controlled  Thank you for referring the patient to [place]. Please feel free to contact with any questions.   Nigel Mormon, MD Pager: [phone] Office: [phone]

## 2022-02-17 ENCOUNTER — Ambulatory Visit: Payer: Medicare Other | Admitting: Cardiology

## 2022-02-17 ENCOUNTER — Ambulatory Visit: Payer: Medicare Other

## 2022-02-17 ENCOUNTER — Encounter: Payer: Self-pay | Admitting: Cardiology

## 2022-02-17 ENCOUNTER — Other Ambulatory Visit (HOSPITAL_COMMUNITY): Payer: Self-pay

## 2022-02-17 VITALS — BP 132/80 | HR 63 | Ht 66.0 in | Wt 139.0 lb

## 2022-02-17 DIAGNOSIS — R011 Cardiac murmur, unspecified: Secondary | ICD-10-CM | POA: Insufficient documentation

## 2022-02-17 DIAGNOSIS — Z01810 Encounter for preprocedural cardiovascular examination: Secondary | ICD-10-CM

## 2022-02-17 DIAGNOSIS — I10 Essential (primary) hypertension: Secondary | ICD-10-CM | POA: Diagnosis not present

## 2022-02-17 DIAGNOSIS — I340 Nonrheumatic mitral (valve) insufficiency: Secondary | ICD-10-CM

## 2022-02-21 DIAGNOSIS — M79605 Pain in left leg: Secondary | ICD-10-CM | POA: Diagnosis not present

## 2022-02-21 DIAGNOSIS — M48062 Spinal stenosis, lumbar region with neurogenic claudication: Secondary | ICD-10-CM | POA: Diagnosis present

## 2022-02-21 DIAGNOSIS — M4125 Other idiopathic scoliosis, thoracolumbar region: Secondary | ICD-10-CM | POA: Diagnosis present

## 2022-02-21 DIAGNOSIS — M4316 Spondylolisthesis, lumbar region: Secondary | ICD-10-CM | POA: Diagnosis not present

## 2022-02-21 DIAGNOSIS — I10 Essential (primary) hypertension: Secondary | ICD-10-CM | POA: Diagnosis present

## 2022-02-21 DIAGNOSIS — Z79899 Other long term (current) drug therapy: Secondary | ICD-10-CM | POA: Diagnosis not present

## 2022-02-21 DIAGNOSIS — M8938 Hypertrophy of bone, other site: Secondary | ICD-10-CM | POA: Diagnosis not present

## 2022-02-21 DIAGNOSIS — M5136 Other intervertebral disc degeneration, lumbar region: Secondary | ICD-10-CM | POA: Diagnosis present

## 2022-02-21 DIAGNOSIS — M4306 Spondylolysis, lumbar region: Secondary | ICD-10-CM | POA: Diagnosis not present

## 2022-03-01 ENCOUNTER — Ambulatory Visit (HOSPITAL_COMMUNITY)
Admission: RE | Admit: 2022-03-01 | Discharge: 2022-03-01 | Disposition: A | Discharge status: Home / Self Care | Payer: Medicare Other | Source: Ambulatory Visit | Attending: Vascular Surgery | Admitting: Vascular Surgery

## 2022-03-01 ENCOUNTER — Other Ambulatory Visit (HOSPITAL_COMMUNITY): Payer: Self-pay | Admitting: Orthopedic Surgery

## 2022-03-01 DIAGNOSIS — R609 Edema, unspecified: Secondary | ICD-10-CM | POA: Insufficient documentation

## 2022-03-04 DIAGNOSIS — M5136 Other intervertebral disc degeneration, lumbar region: Secondary | ICD-10-CM | POA: Diagnosis not present

## 2022-03-04 DIAGNOSIS — Z791 Long term (current) use of non-steroidal anti-inflammatories (NSAID): Secondary | ICD-10-CM | POA: Diagnosis not present

## 2022-03-04 DIAGNOSIS — Z9181 History of falling: Secondary | ICD-10-CM | POA: Diagnosis not present

## 2022-03-04 DIAGNOSIS — Z981 Arthrodesis status: Secondary | ICD-10-CM | POA: Diagnosis not present

## 2022-03-04 DIAGNOSIS — M4306 Spondylolysis, lumbar region: Secondary | ICD-10-CM | POA: Diagnosis not present

## 2022-03-04 DIAGNOSIS — E785 Hyperlipidemia, unspecified: Secondary | ICD-10-CM | POA: Diagnosis not present

## 2022-03-04 DIAGNOSIS — M48062 Spinal stenosis, lumbar region with neurogenic claudication: Secondary | ICD-10-CM | POA: Diagnosis not present

## 2022-03-04 DIAGNOSIS — I10 Essential (primary) hypertension: Secondary | ICD-10-CM | POA: Diagnosis not present

## 2022-03-04 DIAGNOSIS — G4733 Obstructive sleep apnea (adult) (pediatric): Secondary | ICD-10-CM | POA: Diagnosis not present

## 2022-03-04 DIAGNOSIS — M4125 Other idiopathic scoliosis, thoracolumbar region: Secondary | ICD-10-CM | POA: Diagnosis not present

## 2022-03-04 DIAGNOSIS — M4316 Spondylolisthesis, lumbar region: Secondary | ICD-10-CM | POA: Diagnosis not present

## 2022-03-04 DIAGNOSIS — D649 Anemia, unspecified: Secondary | ICD-10-CM | POA: Diagnosis not present

## 2022-03-04 DIAGNOSIS — M4802 Spinal stenosis, cervical region: Secondary | ICD-10-CM | POA: Diagnosis not present

## 2022-03-04 DIAGNOSIS — Z4789 Encounter for other orthopedic aftercare: Secondary | ICD-10-CM | POA: Diagnosis not present

## 2022-03-04 DIAGNOSIS — M19171 Post-traumatic osteoarthritis, right ankle and foot: Secondary | ICD-10-CM | POA: Diagnosis not present

## 2022-03-06 DIAGNOSIS — M48062 Spinal stenosis, lumbar region with neurogenic claudication: Secondary | ICD-10-CM | POA: Diagnosis not present

## 2022-03-06 DIAGNOSIS — M5136 Other intervertebral disc degeneration, lumbar region: Secondary | ICD-10-CM | POA: Diagnosis not present

## 2022-03-06 DIAGNOSIS — Z4789 Encounter for other orthopedic aftercare: Secondary | ICD-10-CM | POA: Diagnosis not present

## 2022-03-06 DIAGNOSIS — M4306 Spondylolysis, lumbar region: Secondary | ICD-10-CM | POA: Diagnosis not present

## 2022-03-06 DIAGNOSIS — M4125 Other idiopathic scoliosis, thoracolumbar region: Secondary | ICD-10-CM | POA: Diagnosis not present

## 2022-03-06 DIAGNOSIS — M4316 Spondylolisthesis, lumbar region: Secondary | ICD-10-CM | POA: Diagnosis not present

## 2022-03-08 ENCOUNTER — Other Ambulatory Visit (HOSPITAL_COMMUNITY): Payer: Self-pay

## 2022-03-08 MED ORDER — LOSARTAN POTASSIUM 50 MG PO TABS
50.0000 mg | ORAL_TABLET | Freq: Every day | ORAL | 3 refills | Status: DC
  Filled 2023-01-12 – 2023-02-27 (×2): qty 90, 90d supply, fill #0

## 2022-03-09 DIAGNOSIS — M4306 Spondylolysis, lumbar region: Secondary | ICD-10-CM | POA: Diagnosis not present

## 2022-03-09 DIAGNOSIS — Z4789 Encounter for other orthopedic aftercare: Secondary | ICD-10-CM | POA: Diagnosis not present

## 2022-03-09 DIAGNOSIS — M5136 Other intervertebral disc degeneration, lumbar region: Secondary | ICD-10-CM | POA: Diagnosis not present

## 2022-03-09 DIAGNOSIS — M4316 Spondylolisthesis, lumbar region: Secondary | ICD-10-CM | POA: Diagnosis not present

## 2022-03-09 DIAGNOSIS — M4125 Other idiopathic scoliosis, thoracolumbar region: Secondary | ICD-10-CM | POA: Diagnosis not present

## 2022-03-09 DIAGNOSIS — M48062 Spinal stenosis, lumbar region with neurogenic claudication: Secondary | ICD-10-CM | POA: Diagnosis not present

## 2022-03-13 ENCOUNTER — Other Ambulatory Visit (HOSPITAL_COMMUNITY): Payer: Self-pay

## 2022-03-13 DIAGNOSIS — Z4789 Encounter for other orthopedic aftercare: Secondary | ICD-10-CM | POA: Diagnosis not present

## 2022-03-13 MED ORDER — METHYLPHENIDATE HCL ER 20 MG PO TBCR
20.0000 mg | EXTENDED_RELEASE_TABLET | Freq: Every day | ORAL | 0 refills | Status: DC
  Filled 2022-03-13 – 2022-03-14 (×2): qty 30, 30d supply, fill #0

## 2022-03-14 ENCOUNTER — Other Ambulatory Visit (HOSPITAL_COMMUNITY): Payer: Self-pay

## 2022-03-14 DIAGNOSIS — M5136 Other intervertebral disc degeneration, lumbar region: Secondary | ICD-10-CM | POA: Diagnosis not present

## 2022-03-14 DIAGNOSIS — M4316 Spondylolisthesis, lumbar region: Secondary | ICD-10-CM | POA: Diagnosis not present

## 2022-03-14 DIAGNOSIS — Z4789 Encounter for other orthopedic aftercare: Secondary | ICD-10-CM | POA: Diagnosis not present

## 2022-03-14 DIAGNOSIS — M4125 Other idiopathic scoliosis, thoracolumbar region: Secondary | ICD-10-CM | POA: Diagnosis not present

## 2022-03-14 DIAGNOSIS — M4306 Spondylolysis, lumbar region: Secondary | ICD-10-CM | POA: Diagnosis not present

## 2022-03-14 DIAGNOSIS — M48062 Spinal stenosis, lumbar region with neurogenic claudication: Secondary | ICD-10-CM | POA: Diagnosis not present

## 2022-03-15 ENCOUNTER — Other Ambulatory Visit (HOSPITAL_COMMUNITY): Payer: Self-pay

## 2022-03-16 ENCOUNTER — Other Ambulatory Visit (HOSPITAL_COMMUNITY): Payer: Self-pay

## 2022-03-16 DIAGNOSIS — M4125 Other idiopathic scoliosis, thoracolumbar region: Secondary | ICD-10-CM | POA: Diagnosis not present

## 2022-03-16 DIAGNOSIS — M4316 Spondylolisthesis, lumbar region: Secondary | ICD-10-CM | POA: Diagnosis not present

## 2022-03-16 DIAGNOSIS — M5136 Other intervertebral disc degeneration, lumbar region: Secondary | ICD-10-CM | POA: Diagnosis not present

## 2022-03-16 DIAGNOSIS — Z4789 Encounter for other orthopedic aftercare: Secondary | ICD-10-CM | POA: Diagnosis not present

## 2022-03-16 DIAGNOSIS — M48062 Spinal stenosis, lumbar region with neurogenic claudication: Secondary | ICD-10-CM | POA: Diagnosis not present

## 2022-03-16 DIAGNOSIS — M4306 Spondylolysis, lumbar region: Secondary | ICD-10-CM | POA: Diagnosis not present

## 2022-03-17 ENCOUNTER — Other Ambulatory Visit: Payer: Medicare Other

## 2022-03-17 ENCOUNTER — Other Ambulatory Visit (HOSPITAL_COMMUNITY): Payer: Self-pay

## 2022-03-17 DIAGNOSIS — M5459 Other low back pain: Secondary | ICD-10-CM | POA: Diagnosis not present

## 2022-03-17 DIAGNOSIS — M4326 Fusion of spine, lumbar region: Secondary | ICD-10-CM | POA: Diagnosis not present

## 2022-03-17 DIAGNOSIS — Z4789 Encounter for other orthopedic aftercare: Secondary | ICD-10-CM | POA: Diagnosis not present

## 2022-03-18 ENCOUNTER — Other Ambulatory Visit (HOSPITAL_COMMUNITY): Payer: Self-pay

## 2022-03-20 ENCOUNTER — Other Ambulatory Visit: Payer: Self-pay

## 2022-03-20 ENCOUNTER — Other Ambulatory Visit (HOSPITAL_COMMUNITY): Payer: Self-pay

## 2022-03-20 MED ORDER — LOSARTAN POTASSIUM 50 MG PO TABS
50.0000 mg | ORAL_TABLET | Freq: Every day | ORAL | 3 refills | Status: AC
  Filled 2022-03-20: qty 90, 90d supply, fill #0

## 2022-03-21 ENCOUNTER — Other Ambulatory Visit: Payer: Self-pay

## 2022-03-21 DIAGNOSIS — M48062 Spinal stenosis, lumbar region with neurogenic claudication: Secondary | ICD-10-CM | POA: Diagnosis not present

## 2022-03-21 DIAGNOSIS — Z4789 Encounter for other orthopedic aftercare: Secondary | ICD-10-CM | POA: Diagnosis not present

## 2022-03-21 DIAGNOSIS — M4316 Spondylolisthesis, lumbar region: Secondary | ICD-10-CM | POA: Diagnosis not present

## 2022-03-21 DIAGNOSIS — M4306 Spondylolysis, lumbar region: Secondary | ICD-10-CM | POA: Diagnosis not present

## 2022-03-21 DIAGNOSIS — M4125 Other idiopathic scoliosis, thoracolumbar region: Secondary | ICD-10-CM | POA: Diagnosis not present

## 2022-03-21 DIAGNOSIS — M5136 Other intervertebral disc degeneration, lumbar region: Secondary | ICD-10-CM | POA: Diagnosis not present

## 2022-03-22 ENCOUNTER — Other Ambulatory Visit: Payer: Self-pay

## 2022-03-22 ENCOUNTER — Other Ambulatory Visit (HOSPITAL_COMMUNITY): Payer: Self-pay

## 2022-03-22 MED ORDER — METHOCARBAMOL 750 MG PO TABS
750.0000 mg | ORAL_TABLET | Freq: Three times a day (TID) | ORAL | 0 refills | Status: DC | PRN
  Filled 2022-03-22: qty 30, 10d supply, fill #0

## 2022-03-23 ENCOUNTER — Other Ambulatory Visit (HOSPITAL_COMMUNITY): Payer: Self-pay

## 2022-03-23 ENCOUNTER — Other Ambulatory Visit: Payer: Medicare Other

## 2022-03-23 DIAGNOSIS — M48062 Spinal stenosis, lumbar region with neurogenic claudication: Secondary | ICD-10-CM | POA: Diagnosis not present

## 2022-03-23 DIAGNOSIS — Z4789 Encounter for other orthopedic aftercare: Secondary | ICD-10-CM | POA: Diagnosis not present

## 2022-03-23 DIAGNOSIS — M5136 Other intervertebral disc degeneration, lumbar region: Secondary | ICD-10-CM | POA: Diagnosis not present

## 2022-03-23 DIAGNOSIS — M4306 Spondylolysis, lumbar region: Secondary | ICD-10-CM | POA: Diagnosis not present

## 2022-03-23 DIAGNOSIS — M4125 Other idiopathic scoliosis, thoracolumbar region: Secondary | ICD-10-CM | POA: Diagnosis not present

## 2022-03-23 DIAGNOSIS — M4316 Spondylolisthesis, lumbar region: Secondary | ICD-10-CM | POA: Diagnosis not present

## 2022-03-30 ENCOUNTER — Other Ambulatory Visit (HOSPITAL_COMMUNITY): Payer: Self-pay

## 2022-03-30 ENCOUNTER — Other Ambulatory Visit: Payer: Self-pay

## 2022-03-30 MED ORDER — METOPROLOL SUCCINATE ER 25 MG PO TB24
25.0000 mg | ORAL_TABLET | Freq: Every day | ORAL | 3 refills | Status: AC
  Filled 2022-03-30: qty 90, 90d supply, fill #0
  Filled 2022-11-20: qty 90, 90d supply, fill #1
  Filled 2023-02-17: qty 90, 90d supply, fill #2

## 2022-04-01 ENCOUNTER — Other Ambulatory Visit (HOSPITAL_COMMUNITY): Payer: Self-pay

## 2022-04-03 ENCOUNTER — Other Ambulatory Visit (HOSPITAL_COMMUNITY): Payer: Self-pay | Admitting: Family Medicine

## 2022-04-03 ENCOUNTER — Other Ambulatory Visit (HOSPITAL_COMMUNITY): Payer: Self-pay

## 2022-04-03 ENCOUNTER — Ambulatory Visit (HOSPITAL_COMMUNITY)
Admission: RE | Admit: 2022-04-03 | Discharge: 2022-04-03 | Disposition: A | Discharge status: Home / Self Care | Payer: Medicare Other | Source: Ambulatory Visit | Attending: Surgery | Admitting: Surgery

## 2022-04-03 ENCOUNTER — Other Ambulatory Visit: Payer: Self-pay

## 2022-04-03 DIAGNOSIS — M79662 Pain in left lower leg: Secondary | ICD-10-CM

## 2022-04-03 DIAGNOSIS — D649 Anemia, unspecified: Secondary | ICD-10-CM | POA: Diagnosis not present

## 2022-04-03 DIAGNOSIS — I10 Essential (primary) hypertension: Secondary | ICD-10-CM | POA: Diagnosis not present

## 2022-04-03 DIAGNOSIS — M5136 Other intervertebral disc degeneration, lumbar region: Secondary | ICD-10-CM | POA: Diagnosis not present

## 2022-04-03 DIAGNOSIS — Z791 Long term (current) use of non-steroidal anti-inflammatories (NSAID): Secondary | ICD-10-CM | POA: Diagnosis not present

## 2022-04-03 DIAGNOSIS — Z4789 Encounter for other orthopedic aftercare: Secondary | ICD-10-CM | POA: Diagnosis not present

## 2022-04-03 DIAGNOSIS — Z9181 History of falling: Secondary | ICD-10-CM | POA: Diagnosis not present

## 2022-04-03 DIAGNOSIS — M4316 Spondylolisthesis, lumbar region: Secondary | ICD-10-CM | POA: Diagnosis not present

## 2022-04-03 DIAGNOSIS — M48062 Spinal stenosis, lumbar region with neurogenic claudication: Secondary | ICD-10-CM | POA: Diagnosis not present

## 2022-04-03 DIAGNOSIS — M4802 Spinal stenosis, cervical region: Secondary | ICD-10-CM | POA: Diagnosis not present

## 2022-04-03 DIAGNOSIS — G4733 Obstructive sleep apnea (adult) (pediatric): Secondary | ICD-10-CM | POA: Diagnosis not present

## 2022-04-03 DIAGNOSIS — M4125 Other idiopathic scoliosis, thoracolumbar region: Secondary | ICD-10-CM | POA: Diagnosis not present

## 2022-04-03 DIAGNOSIS — M19171 Post-traumatic osteoarthritis, right ankle and foot: Secondary | ICD-10-CM | POA: Diagnosis not present

## 2022-04-03 DIAGNOSIS — M4306 Spondylolysis, lumbar region: Secondary | ICD-10-CM | POA: Diagnosis not present

## 2022-04-03 DIAGNOSIS — D751 Secondary polycythemia: Secondary | ICD-10-CM | POA: Diagnosis not present

## 2022-04-03 DIAGNOSIS — E785 Hyperlipidemia, unspecified: Secondary | ICD-10-CM | POA: Diagnosis not present

## 2022-04-03 DIAGNOSIS — R29898 Other symptoms and signs involving the musculoskeletal system: Secondary | ICD-10-CM | POA: Diagnosis not present

## 2022-04-03 DIAGNOSIS — Z981 Arthrodesis status: Secondary | ICD-10-CM | POA: Diagnosis not present

## 2022-04-03 MED ORDER — EZETIMIBE 10 MG PO TABS
10.0000 mg | ORAL_TABLET | Freq: Every day | ORAL | 1 refills | Status: AC
  Filled 2022-04-03: qty 90, 90d supply, fill #0
  Filled 2022-10-30: qty 90, 90d supply, fill #1

## 2022-04-06 DIAGNOSIS — M4306 Spondylolysis, lumbar region: Secondary | ICD-10-CM | POA: Diagnosis not present

## 2022-04-06 DIAGNOSIS — Z4789 Encounter for other orthopedic aftercare: Secondary | ICD-10-CM | POA: Diagnosis not present

## 2022-04-06 DIAGNOSIS — M48062 Spinal stenosis, lumbar region with neurogenic claudication: Secondary | ICD-10-CM | POA: Diagnosis not present

## 2022-04-06 DIAGNOSIS — M4316 Spondylolisthesis, lumbar region: Secondary | ICD-10-CM | POA: Diagnosis not present

## 2022-04-06 DIAGNOSIS — M5136 Other intervertebral disc degeneration, lumbar region: Secondary | ICD-10-CM | POA: Diagnosis not present

## 2022-04-06 DIAGNOSIS — M4125 Other idiopathic scoliosis, thoracolumbar region: Secondary | ICD-10-CM | POA: Diagnosis not present

## 2022-04-20 ENCOUNTER — Other Ambulatory Visit: Payer: Self-pay

## 2022-04-20 ENCOUNTER — Other Ambulatory Visit (HOSPITAL_COMMUNITY): Payer: Self-pay

## 2022-04-20 MED ORDER — AMLODIPINE BESYLATE 5 MG PO TABS
5.0000 mg | ORAL_TABLET | Freq: Every day | ORAL | 3 refills | Status: AC
  Filled 2022-04-20: qty 90, 90d supply, fill #0
  Filled 2022-10-30: qty 90, 90d supply, fill #1
  Filled 2023-01-27: qty 90, 90d supply, fill #2

## 2022-04-25 ENCOUNTER — Other Ambulatory Visit: Payer: Self-pay

## 2022-04-25 ENCOUNTER — Other Ambulatory Visit (HOSPITAL_COMMUNITY): Payer: Self-pay

## 2022-04-25 MED ORDER — METHYLPHENIDATE HCL ER 20 MG PO TBCR
20.0000 mg | EXTENDED_RELEASE_TABLET | Freq: Every day | ORAL | 0 refills | Status: DC | PRN
  Filled 2022-04-25: qty 30, 30d supply, fill #0

## 2022-04-27 ENCOUNTER — Other Ambulatory Visit (HOSPITAL_COMMUNITY): Payer: Self-pay

## 2022-04-28 ENCOUNTER — Other Ambulatory Visit (HOSPITAL_COMMUNITY): Payer: Self-pay

## 2022-05-10 ENCOUNTER — Other Ambulatory Visit (HOSPITAL_COMMUNITY): Payer: Self-pay

## 2022-05-10 MED ORDER — TESTOSTERONE CYPIONATE 200 MG/ML IM SOLN
200.0000 mg | INTRAMUSCULAR | 0 refills | Status: AC
  Filled 2022-05-10: qty 3, 63d supply, fill #0

## 2022-05-14 ENCOUNTER — Other Ambulatory Visit (HOSPITAL_COMMUNITY): Payer: Self-pay

## 2022-05-19 ENCOUNTER — Other Ambulatory Visit (HOSPITAL_COMMUNITY): Payer: Self-pay

## 2022-05-19 ENCOUNTER — Other Ambulatory Visit: Payer: Self-pay

## 2022-05-19 MED ORDER — FAMCICLOVIR 125 MG PO TABS
125.0000 mg | ORAL_TABLET | Freq: Two times a day (BID) | ORAL | 3 refills | Status: DC
  Filled 2022-05-19: qty 180, 90d supply, fill #0

## 2022-05-22 ENCOUNTER — Other Ambulatory Visit: Payer: Self-pay

## 2022-05-22 ENCOUNTER — Encounter: Payer: Self-pay | Admitting: Pharmacist

## 2022-05-22 ENCOUNTER — Other Ambulatory Visit (HOSPITAL_COMMUNITY): Payer: Self-pay

## 2022-05-25 DIAGNOSIS — Z4789 Encounter for other orthopedic aftercare: Secondary | ICD-10-CM | POA: Diagnosis not present

## 2022-05-25 DIAGNOSIS — M4326 Fusion of spine, lumbar region: Secondary | ICD-10-CM | POA: Diagnosis not present

## 2022-06-01 ENCOUNTER — Other Ambulatory Visit: Payer: Self-pay

## 2022-06-01 ENCOUNTER — Other Ambulatory Visit (HOSPITAL_COMMUNITY): Payer: Self-pay

## 2022-06-01 MED ORDER — METHYLPHENIDATE HCL ER 20 MG PO TBCR
20.0000 mg | EXTENDED_RELEASE_TABLET | Freq: Every day | ORAL | 0 refills | Status: DC
  Filled 2022-06-01: qty 30, 30d supply, fill #0

## 2022-06-06 NOTE — H&P (Signed)
TOTAL KNEE ADMISSION H&P  Patient is being admitted for left total knee arthroplasty.  Subjective:  Chief Complaint: Left knee pain.  HPI: Joshua [name], 72 y.o. male has a history of pain and functional disability in the left knee due to arthritis and has failed non-surgical conservative treatments for greater than 12 weeks to include NSAID's and/or analgesics, corticosteriod injections, flexibility and strengthening excercises, and activity modification. Onset of symptoms was gradual, starting several years ago with gradually worsening course since that time. The patient noted no past surgery on the left knee.  Patient currently rates pain in the left knee at 8 out of 10 with activity. Patient has night pain, worsening of pain with activity and weight bearing, and pain that interferes with activities of daily living. Patient has evidence of   bone-on-bone arthritis in the medial patellofemoral left knee with significant tibial subluxation and varus  by imaging studies. There is no active infection.  Patient Active Problem List   Diagnosis Date Noted   Murmur 02/17/2022   Nonrheumatic mitral valve regurgitation 02/17/2022   Preop cardiovascular exam 02/16/2022   Abdominal aortic aneurysm (AAA) without rupture (HCC) 05/20/2018   Poor compliance with CPAP treatment 05/20/2018   HTN, goal below 140/80 05/20/2018   Complex sleep apnea syndrome 04/01/2018   Snoring 12/21/2016   UARS (upper airway resistance syndrome) 12/21/2016   HNP (herniated nucleus pulposus), cervical 06/28/2016   Spinal stenosis of cervical region 05/02/2016   Primary osteoarthritis, right shoulder 05/02/2016   OSA (obstructive sleep apnea) 12/01/2013   ERRONEOUS ENCOUNTER--DISREGARD 10/27/2013   Hypersomnia, persistent 10/03/2013    Past Medical History:  Diagnosis Date   Anemia    as a child   Anxiety    Arthritis    Depression    takes Wellbutrin daily   Hyperlipidemia    takes Simvastatin daily    Hypertension    takes Metoprolol and Hyzaar daily    Muscle spasm    takes Robaxin as needed   Nerve pain    takes Gabapentin as needed   OSA (obstructive sleep apnea) 12/01/2013   16.6 AHI and RDI of over 30. REM AHI over 30.     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 and states it is very well controlled   Urinary frequency    Weakness    numbness and tingling in right arm down to fingers    Past Surgical History:  Procedure Laterality Date   ANKLE ARTHROPLASTY Right    ANTERIOR CERVICAL DECOMP/DISCECTOMY FUSION N/A 06/28/2016   Procedure: ANTERIOR CERVICAL DECOMPRESSION FUSION CERVICAL THREE-FOUR ,CERVICAL FOUR-FIVE,CERVICAL FIVE-SIX;  Surgeon: Shirlean Kelly, MD;  Location: [facility] OR;  Service: Neurosurgery;  Laterality: N/A;   APPENDECTOMY     COLONOSCOPY     lap inguinal hernia repair wiht mesh Bilateral 10/2003   REFRACTIVE SURGERY  2003   TONSILLECTOMY     wisdom teeth extracted      Prior to Admission medications   Medication Sig Start Date End Date Taking? Authorizing Provider  amLODipine (NORVASC) 5 MG tablet Take 1 tablet (5 mg total) by mouth daily as directed. 11/10/21     amLODipine (NORVASC) 5 MG tablet Take 1 tablet (5 mg total) by mouth daily. 04/20/22     ascorbic acid (VITAMIN C) 1000 MG tablet Take 1 tablet by mouth. Twice a week    [provider]  buPROPion (WELLBUTRIN SR) 150 MG 12 hr tablet Take 1 tablet (150 mg total) by mouth 2 (two) times  daily. 01/16/22     ezetimibe (ZETIA) 10 MG tablet Take 1 tablet (10 mg total) by mouth daily. 04/03/22     famciclovir (FAMVIR) 125 MG tablet Take 1 tablet by mouth 2 times daily. 07/05/21     famciclovir (FAMVIR) 125 MG tablet Take 1 tablet (125 mg total) by mouth 2 (two) times daily. 05/19/22     famotidine (PEPCID) 20 MG tablet TAKE 1 TABLET BY MOUTH ONCE DAILY AS NEEDED FOR HEART BURN 04/19/21     gabapentin (NEURONTIN) 300 MG capsule Take 1 capsule (300 mg total) by mouth daily as needed for back pain  12/21/21     losartan (COZAAR) 50 MG tablet Take 1 tablet (50 mg total) by mouth daily. 02/02/22     losartan (COZAAR) 50 MG tablet Take 1 tablet (50 mg total) by mouth daily. 03/08/22     losartan (COZAAR) 50 MG tablet Take 1 tablet (50 mg total) by mouth daily. 03/20/22     methocarbamol (ROBAXIN) 750 MG tablet Take 1 tablet (750 mg total) by mouth every 8 (eight) hours as needed for muscle spasms 03/22/22     methylphenidate (METADATE ER) 20 MG ER tablet Take 20 mg by mouth every morning. 01/24/22   [provider]  methylphenidate (METADATE ER) 20 MG ER tablet Take 1 tablet (20 mg total) by mouth daily as needed. 04/25/22     methylphenidate (METADATE ER) 20 MG ER tablet Take 1 tablet (20 mg total) by mouth daily as needed 06/01/22     metoprolol succinate (TOPROL-XL) 25 MG 24 hr tablet Take 1 tablet (25 mg total) by mouth daily. 03/30/22     Multiple Vitamin (MULTIVITAMINS PO) Take 1 tablet by mouth daily.    [provider]  naproxen sodium (ALEVE) 220 MG tablet Take 220 mg by mouth daily as needed. 10/04/11   [provider]  simvastatin (ZOCOR) 40 MG tablet Take 1 tablet (40 mg total) by mouth daily. 11/20/21     SYRINGE-NEEDLE, DISP, 3 ML (B-D 3CC LUER-LOK SYR 18GX1-1/2) 18G X 1-1/2" 3 ML MISC Use as directed with Testosterone 08/16/20     tamsulosin (FLOMAX) 0.4 MG CAPS capsule Take 1 capsule (0.4 mg total) by mouth daily. 02/02/22     testosterone cypionate (DEPOTESTOSTERONE CYPIONATE) 200 MG/ML injection Inject 1 mL (200 mg total) into the muscle every 21 ( twenty-one) days. 01/10/22     testosterone cypionate (DEPOTESTOSTERONE CYPIONATE) 200 MG/ML injection Inject 1 mL (200 mg total) into the muscle every 21 ( twenty-one) days. 05/08/22     Vitamin E 268 MG (400 UNIT) CAPS Take 1 tablet by mouth once a week.    [provider]    No Known Allergies  Social History   Socioeconomic History   Marital status: Married    Spouse name: [name]   Number of  children: 2   Years of education: College   Highest education level: Not on file  Occupational History    Employer: [employer]   Occupation: retired Art gallery manager  Tobacco Use   Smoking status: Never   Smokeless tobacco: Never  Vaping Use   Vaping Use: Never used  Substance and Sexual Activity   Alcohol use: Yes    Comment: .5 per day   Drug use: No   Sexual activity: Not on file  Other Topics Concern   Not on file  Social History Narrative   Patient is married [name])   Patient has two children.   Patient drinks two  caffeine drinks per day.   Patient is right-handed.   Patient has a college education.   Social Determinants of Health   Financial Resource Strain: Not on file  Food Insecurity: Not on file  Transportation Needs: Not on file  Physical Activity: Not on file  Stress: Not on file  Social Connections: Not on file  Intimate Partner Violence: Not on file    Tobacco Use: Low Risk  (02/17/2022)   Patient History    Smoking Tobacco Use: Never    Smokeless Tobacco Use: Never    Passive Exposure: Not on file   Social History   Substance and Sexual Activity  Alcohol Use Yes   Comment: .5 per day    Family History  Problem Relation Age of Onset   Lymphoma Mother    Lung disease Father    Parkinson's disease Father    Hypertension Father    Hypertension Sister    Hyperlipidemia Brother    Hypertension Brother    Stroke Maternal Grandmother    Ulcerative colitis Daughter     Review of Systems  Constitutional:  Negative for chills and fever.  HENT: Negative.    Eyes: Negative.   Respiratory:  Negative for cough and shortness of breath.   Cardiovascular:  Negative for chest pain and palpitations.  Gastrointestinal:  Negative for abdominal pain, constipation, diarrhea, nausea and vomiting.  Genitourinary:  Negative for dysuria, frequency and urgency.  Musculoskeletal:  Positive for joint pain.  Skin:  Negative for rash.   Objective:  Physical  Exam: Well nourished and well developed.  General: Alert and oriented x3, cooperative and pleasant, no acute distress.  Head: normocephalic, atraumatic, neck supple.  Eyes: EOMI.  Abdomen: non-tender to palpation and soft, normoactive bowel sounds. Musculoskeletal:  Left Knee Exam:  No effusion present. No swelling present.  Significant bony hypertrophy present.  The range of motion is: 0 to 125 degrees.  No crepitus on range of motion of the knee  Slight anteromedial joint line tenderness.  No lateral joint line tenderness.  The knee is stable  Calves soft and nontender. Motor function intact in LE. Strength 5/5 LE bilaterally. Neuro: Distal pulses 2+. Sensation to light touch intact in LE.  Vital signs in last 24 hours: BP: ()/()  Arterial Line BP: ()/()   Imaging Review Plain radiographs demonstrate severe degenerative joint disease of the left knee. The overall alignment is mild varus. The bone quality appears to be adequate for age and reported activity level.  Assessment/Plan:  End stage arthritis, left knee   The patient history, physical examination, clinical judgment of the provider and imaging studies are consistent with end stage degenerative joint disease of the left knee and total knee arthroplasty is deemed medically necessary. The treatment options including medical management, injection therapy arthroscopy and arthroplasty were discussed at length. The risks and benefits of total knee arthroplasty were presented and reviewed. The risks due to aseptic loosening, infection, stiffness, patella tracking problems, thromboembolic complications and other imponderables were discussed. The patient acknowledged the explanation, agreed to proceed with the plan and consent was signed. Patient is being admitted for inpatient treatment for surgery, pain control, PT, OT, prophylactic antibiotics, VTE prophylaxis, progressive ambulation and ADLs and discharge planning. The patient is  planning to be discharged  home .  Patient's anticipated LOS is less than 2 midnights, meeting these requirements: - Lives within 1 hour of care - Has a competent adult at home to recover with post-op - NO history of  -  Chronic pain requiring opioids  - Diabetes  - Coronary Artery Disease  - Heart failure  - Heart attack  - Stroke  - DVT/VTE  - Cardiac arrhythmia  - Respiratory Failure/COPD  - Renal failure  - Anemia  - Advanced Liver disease  Therapy Plans: [facility] Disposition: Home with Wife Planned DVT Prophylaxis: Aspirin 81 mg BID DME Needed: None - checking at home and will contact us if RW is needed PCP: Ivery Quale, MD (clearance received) TXA: IV Allergies: NKDA Anesthesia Concerns: difficulty awakening BMI: 24.1 Last HgbA1c: not diabetic  Pharmacy: [facility] (deliver to room)  - Patient was instructed on what medications to stop prior to surgery. - Follow-up visit in 2 weeks with Dr. Lequita Halt - Begin physical therapy following surgery - Pre-operative lab work as pre-surgical testing - Prescriptions will be provided in hospital at time of discharge  R. Arcola Jansky, PA-C Orthopedic Surgery [facility] [place]

## 2022-06-12 NOTE — Patient Instructions (Signed)
SURGICAL WAITING ROOM VISITATION Patients having surgery or a procedure may have no more than 2 support people in the waiting area - these visitors may rotate in the visitor waiting room.   Due to an increase in RSV and influenza rates and associated hospitalizations, children ages 64 and under may not visit patients in [facility] hospitals. If the patient needs to stay at the hospital during part of their recovery, the visitor guidelines for inpatient rooms apply.  PRE-OP VISITATION  Pre-op nurse will coordinate an appropriate time for 1 support person to accompany the patient in pre-op.  This support person may not rotate.  This visitor will be contacted when the time is appropriate for the visitor to come back in the pre-op area.  Please refer to the [facility] website for the visitor guidelines for Inpatients (after your surgery is over and you are in a regular room).  You are not required to quarantine at this time prior to your surgery. However, you must do this: Hand Hygiene often Do NOT share personal items Notify your provider if you are in close contact with someone who has COVID or you develop fever 100.4 or greater, new onset of sneezing, cough, sore throat, shortness of breath or body aches.  If you test positive for Covid or have been in contact with anyone that has tested positive in the last 10 days please notify you surgeon.    Your procedure is scheduled on:  Monday   Jun 26, 2022  Report to [facility] Main Entrance: [address] entrance where the [employer] is available.   Report to admitting at:   09:00  AM  +++++Call this number if you have any questions or problems the morning of surgery [phone]  Do not eat food after Midnight the night prior to your surgery/procedure.  After Midnight you may have the following liquids until  08:30  AM DAY OF SURGERY  Clear Liquid Diet Water Black Coffee (sugar ok, NO MILK/CREAM OR CREAMERS)  Tea (sugar ok, NO  MILK/CREAM OR CREAMERS) regular and decaf                             Plain Jell-O  with no fruit (NO RED)                                           Fruit ices (not with fruit pulp, NO RED)                                     Popsicles (NO RED)                                                                  Juice: apple, WHITE grape, WHITE cranberry Sports drinks like Gatorade or Powerade (NO RED)                   The day of surgery:  Drink ONE (1) Pre-Surgery Clear Ensure at   08:30 AM the morning of surgery. Drink in  one sitting. Do not sip.  This drink was given to you during your hospital pre-op appointment visit. Nothing else to drink after completing the Pre-Surgery Clear Ensure : No candy, chewing gum or throat lozenges.    FOLLOW  ANY ADDITIONAL PRE OP INSTRUCTIONS YOU RECEIVED FROM YOUR SURGEON'S OFFICE!!!   Oral Hygiene is also important to reduce your risk of infection.        Remember - BRUSH YOUR TEETH THE MORNING OF SURGERY WITH YOUR REGULAR TOOTHPASTE  Do NOT smoke after Midnight the night before surgery.  Take ONLY these medicines the morning of surgery with A SIP OF WATER: Tamsulosin (Flomax), amlodipine, metoprolol, Bupropion (Wellbutrin), You may take Famcyclovir, Famotidine, and Gabapentin if needed.   If You have been diagnosed with Sleep Apnea - Bring CPAP mask and tubing day of surgery. We will provide you with a CPAP machine on the day of your surgery.                   You may not have any metal on your body including  jewelry, and body piercing  Do not wear lotions, powders, cologne, or deodorant  Men may shave face and neck.  Contacts, Hearing Aids, dentures or bridgework may not be worn into surgery. DENTURES WILL BE REMOVED PRIOR TO SURGERY PLEASE DO NOT APPLY "Poly grip" OR ADHESIVES!!!  You may bring a small overnight bag with you on the day of surgery, only pack items that are not valuable. [facility] IS NOT RESPONSIBLE   FOR VALUABLES THAT ARE  LOST OR STOLEN.   Do not bring your home medications to the hospital. The Pharmacy will dispense medications listed on your medication list to you during your admission in the Hospital.  Special Instructions: Bring a copy of your healthcare power of attorney and living will documents the day of surgery, if you wish to have them scanned into your [facility] Records- EPIC  Please read over the following fact sheets you were given: IF YOU HAVE QUESTIONS ABOUT YOUR PRE-OP INSTRUCTIONS, PLEASE CALL [phone].   +++++++ PLEASE FOLLOW THE ATTACHED INFORMATION REGARDING SHOWERING / BATHING SCHEDULE  PRIOR TO YOUR SURGERY. Start this schedule on :   ON THE DAY OF SURGERY :  Thursday  Jun 22, 2022    Do not apply any lotions/deodorants the morning of surgery.  Please wear clean clothes to the hospital/surgery center.    FAILURE TO FOLLOW THESE INSTRUCTIONS MAY RESULT IN THE CANCELLATION OF YOUR SURGERY  PATIENT SIGNATURE_________________________________  NURSE SIGNATURE__________________________________  ________________________________________________________________________         Rogelia Mire    An incentive spirometer is a tool that can help keep your lungs clear and active. This tool measures how well you are filling your lungs with each breath. Taking long deep breaths may help reverse or decrease the chance of developing breathing (pulmonary) problems (especially infection) following: A long period of time when you are unable to move or be active. BEFORE THE PROCEDURE  If the spirometer includes an indicator to show your best effort, your nurse or respiratory therapist will set it to a desired goal. If possible, sit up straight or lean slightly forward. Try not to slouch. Hold the incentive spirometer in an upright position. INSTRUCTIONS FOR USE  Sit on the edge of your bed if possible, or sit up as far as you can in bed or on a chair. Hold the incentive  spirometer in an upright position. Breathe out normally. Place the  mouthpiece in your mouth and seal your lips tightly around it. Breathe in slowly and as deeply as possible, raising the piston or the ball toward the top of the column. Hold your breath for 3-5 seconds or for as long as possible. Allow the piston or ball to fall to the bottom of the column. Remove the mouthpiece from your mouth and breathe out normally. Rest for a few seconds and repeat Steps 1 through 7 at least 10 times every 1-2 hours when you are awake. Take your time and take a few normal breaths between deep breaths. The spirometer may include an indicator to show your best effort. Use the indicator as a goal to work toward during each repetition. After each set of 10 deep breaths, practice coughing to be sure your lungs are clear. If you have an incision (the cut made at the time of surgery), support your incision when coughing by placing a pillow or rolled up towels firmly against it. Once you are able to get out of bed, walk around indoors and cough well. You may stop using the incentive spirometer when instructed by your caregiver.  RISKS AND COMPLICATIONS Take your time so you do not get dizzy or light-headed. If you are in pain, you may need to take or ask for pain medication before doing incentive spirometry. It is harder to take a deep breath if you are having pain. AFTER USE Rest and breathe slowly and easily. It can be helpful to keep track of a log of your progress. Your caregiver can provide you with a simple table to help with this. If you are using the spirometer at home, follow these instructions: SEEK MEDICAL CARE IF:  You are having difficultly using the spirometer. You have trouble using the spirometer as often as instructed. Your pain medication is not giving enough relief while using the spirometer. You develop fever of 100.5 F (38.1 C) or higher.                                                                                                     SEEK IMMEDIATE MEDICAL CARE IF:  You cough up bloody sputum that had not been present before. You develop fever of 102 F (38.9 C) or greater. You develop worsening pain at or near the incision site. MAKE SURE YOU:  Understand these instructions. Will watch your condition. Will get help right away if you are not doing well or get worse. Document Released: 06/05/2006 Document Revised: 04/17/2011 Document Reviewed: 08/06/2006 [facility] Patient Information 2014 [place], [place].

## 2022-06-12 NOTE — Progress Notes (Signed)
COVID Vaccine received:  []  No []  Yes Date of any COVID positive Test in last 90 days:  PCP - Jarome Matin, MD Cardiologist - Truett Mainland, MD  Chest x-ray -  EKG -  02-17-2022  Epic Stress Test -  ECHO - 02-17-2022  Epic Cardiac Cath -   PCR screen: [x]  Ordered & Completed           []   No Order but Needs PROFEND           []   N/A for this surgery  Surgery Plan:  []  Ambulatory                            [x]  Outpatient in bed                            []  Admit  Anesthesia:    []  General  []  Spinal                           [x]   Choice []   MAC  Pacemaker / ICD device [x]  No []  Yes   Spinal Cord Stimulator:[x]  No []  Yes       History of Sleep Apnea? []  No [x]  Yes   CPAP used?- []  No [x]  Yes    Does the patient monitor blood sugar?          []  No []  Yes  [x]  N/A  Patient has: [x]  NO Hx DM   []  Pre-DM                 []  DM1  []   DM2 Does patient have a [employer] or [employer]? []  No []  Yes   Fasting Blood Sugar Ranges-  Checks Blood Sugar _____ times a day  Blood Thinner / Instructions:none Aspirin Instructions: none  ERAS Protocol Ordered: []  No  [x]  Yes PRE-SURGERY [x]  ENSURE  []  G2  Patient is to be NPO after: 08:30 am  Patient was given the 5 CHG shower / bath instructions for THA surgery along with 2 bottles of the CHG soap. Patient will start this on: Thursday  06-22-2022.  Patient voiced understanding of this process.   Activity level: Patient is able / unable to climb a flight of stairs without difficulty; []  No CP  []  No SOB, but would have ___   Patient can / can not perform ADLs without assistance.   Anesthesia review: OSA-CPAP, HTN, AAA, nonrheumatic MVR, murmur, Depression, ACDF (C3-4, C4-5, C5-6) Dr. Newell Coral 06-2016,  Difficult to wake up  Patient denies shortness of breath, fever, cough and chest pain at PAT appointment.  Patient verbalized understanding and agreement to the Pre-Surgical Instructions that were given to them at this PAT  appointment. Patient was also educated of the need to review these PAT instructions again prior to his surgery.I reviewed the appropriate phone numbers to call if they have any and questions or concerns.

## 2022-06-13 ENCOUNTER — Encounter (HOSPITAL_COMMUNITY): Payer: Self-pay

## 2022-06-13 ENCOUNTER — Encounter (HOSPITAL_COMMUNITY)
Admission: RE | Admit: 2022-06-13 | Discharge: 2022-06-13 | Disposition: A | Discharge status: Home / Self Care | Payer: Medicare Other | Source: Ambulatory Visit | Attending: Orthopedic Surgery | Admitting: Orthopedic Surgery

## 2022-06-13 ENCOUNTER — Other Ambulatory Visit: Payer: Self-pay

## 2022-06-13 VITALS — BP 134/90 | HR 55 | Temp 97.8°F | Resp 16 | Ht 64.5 in | Wt 140.4 lb

## 2022-06-13 DIAGNOSIS — M1712 Unilateral primary osteoarthritis, left knee: Secondary | ICD-10-CM | POA: Insufficient documentation

## 2022-06-13 DIAGNOSIS — K219 Gastro-esophageal reflux disease without esophagitis: Secondary | ICD-10-CM | POA: Diagnosis not present

## 2022-06-13 DIAGNOSIS — Z01818 Encounter for other preprocedural examination: Secondary | ICD-10-CM

## 2022-06-13 DIAGNOSIS — Z01812 Encounter for preprocedural laboratory examination: Secondary | ICD-10-CM | POA: Diagnosis not present

## 2022-06-13 DIAGNOSIS — I10 Essential (primary) hypertension: Secondary | ICD-10-CM | POA: Diagnosis not present

## 2022-06-13 DIAGNOSIS — G4733 Obstructive sleep apnea (adult) (pediatric): Secondary | ICD-10-CM | POA: Diagnosis not present

## 2022-06-13 HISTORY — DX: Other complications of anesthesia, initial encounter: T88.59XA

## 2022-06-13 HISTORY — DX: Gastro-esophageal reflux disease without esophagitis: K21.9

## 2022-06-13 LAB — CBC
HCT: 48.3 % (ref 39.0–52.0)
Hemoglobin: 15.7 g/dL (ref 13.0–17.0)
MCH: 29.5 pg (ref 26.0–34.0)
MCHC: 32.5 g/dL (ref 30.0–36.0)
MCV: 90.6 fL (ref 80.0–100.0)
Platelets: 232 10*3/uL (ref 150–400)
RBC: 5.33 MIL/uL (ref 4.22–5.81)
RDW: 12.4 % (ref 11.5–15.5)
WBC: 5.2 10*3/uL (ref 4.0–10.5)
nRBC: 0 % (ref 0.0–0.2)

## 2022-06-13 LAB — BASIC METABOLIC PANEL
Anion gap: 7 (ref 5–15)
BUN: 26 mg/dL — ABNORMAL HIGH (ref 8–23)
CO2: 27 mmol/L (ref 22–32)
Calcium: 9.2 mg/dL (ref 8.9–10.3)
Chloride: 103 mmol/L (ref 98–111)
Creatinine, Ser: 0.9 mg/dL (ref 0.61–1.24)
GFR, Estimated: >60 mL/min (ref >60)
Glucose, Bld: 85 mg/dL (ref 70–99)
Potassium: 4.2 mmol/L (ref 3.5–5.1)
Sodium: 137 mmol/L (ref 135–145)

## 2022-06-13 LAB — SURGICAL PCR SCREEN
MRSA, PCR: NEGATIVE
Staphylococcus aureus: POSITIVE — AB

## 2022-06-13 NOTE — Progress Notes (Signed)
Patient came for presurgical appointment and stated that he is probably going to change the side from left to right knee.  He will notify the surgeon's office that he wants to change.  Consent was not signed at presurgery appointment.

## 2022-06-13 NOTE — Progress Notes (Addendum)
COVID Vaccine Completed:  Yes   Date of COVID positive in last 90 days:  No  PCP - Jarome Matin, MD Cardiologist - Truett Mainland, MD  Chest x-ray - N/A EKG - 02-17-22 Epic Stress Test - Several years ago ECHO - 02-17-22 Epic Cardiac Cath - N/A Pacemaker/ICD device last checked: Spinal Cord Stimulator: N/A  Bowel Prep - N/A  Sleep Study - Yes, +sleep apnea CPAP - Yes, uses sometimes. Will bring mask and tubing day of surgery  Fasting Blood Sugar - N/A Checks Blood Sugar _____ times a day  Last dose of GLP1 agonist-  N/A GLP1 instructions:  N/A   Last dose of SGLT-2 inhibitors-  N/A SGLT-2 instructions: N/A  Blood Thinner Instructions:  N/A Aspirin Instructions: Last Dose:  Activity level:  Can go up a flight of stairs and perform activities of daily living without stopping and without symptoms of chest pain or shortness of breath.  Able to exercise without symptoms  Anesthesia review:  AAA, nonrheumatic MVR, aortic stenosis, murmur, HTN, OSA  Patient denies shortness of breath, fever, cough and chest pain at PAT appointment  Patient verbalized understanding of instructions that were given to them at the PAT appointment. Patient was also instructed that they will need to review over the PAT instructions again at home before surgery.

## 2022-06-14 NOTE — Progress Notes (Signed)
PCR results sent to Dr. Aluisio to review.  ?

## 2022-06-16 NOTE — Progress Notes (Signed)
Anesthesia chart review   Case: [id number] Date/Time: 06/26/22 1120   Procedure: TOTAL KNEE ARTHROPLASTY (Left: Knee)   Anesthesia type: Choice   Pre-op diagnosis: left knee osteoarthritis   Location: [facility] / [facility]   Surgeons: Ollen Gross, MD       DISCUSSION: 72 year old never smoker with history of HTN, OSA, GERD, left knee OA scheduled for above procedure 06/26/2022 with Dr. Ollen Gross.   Patient seen by cardiology 02/17/2022 for preoperative evaluation.  Per office visit note, "Unremarkable resting EKG. Echocardiogram with moderate regurgitation, aortic sclerosis, clinically nonsignificant. Overall, low perioperative cardiac risk."  Anticipate pt can proceed with planned procedure barring acute status change.   VS: BP (!) 134/90   Pulse (!) 55   Temp 36.6 C (Oral)   Resp 16   Ht 5' 4.5" (1.638 m)   Wt 63.7 kg   SpO2 100%   BMI 23.73 kg/m   PROVIDERS: Garlan Fillers, MD is PCP  Truett Mainland, MD is cardiologist LABS: Labs reviewed: Acceptable for surgery. (all labs ordered are listed, but only abnormal results are displayed)  Labs Reviewed  SURGICAL PCR SCREEN - Abnormal; Notable for the following components:      Result Value   Staphylococcus aureus POSITIVE (*)    All other components within normal limits  BASIC METABOLIC PANEL - Abnormal; Notable for the following components:   BUN 26 (*)    All other components within normal limits  CBC     IMAGES:   EKG:   CV: Echocardiogram 02/17/2022:  Left ventricle cavity is normal in size and wall thickness. Normal global  wall motion. Normal LV systolic function with EF 53%. Doppler evidence of  grade I (impaired) diastolic dysfunction, normal LAP.  Trileaflet aortic valve. Moderate aortic valve leaflet calcification.  Aortic sclerosis. Trace aortic regurgitation.  Mild to moderate mitral regurgitation.  Mild tricuspid regurgitation.  Mild pulmonic regurgitation.  No evidence of pulmonary  hypertension.  Past Medical History:  Diagnosis Date   Anemia    as a child   Anxiety    Arthritis    Complication of anesthesia    Hard to wake up after colonoscopy   Depression    takes Wellbutrin daily   GERD (gastroesophageal reflux disease)    Hyperlipidemia    takes Simvastatin daily   Hypertension    takes Metoprolol and Hyzaar daily    Muscle spasm    takes Robaxin as needed   Nerve pain    takes Gabapentin as needed   OSA (obstructive sleep apnea) 12/01/2013   16.6 AHI and RDI of over 30. REM AHI over 30.     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 and states it is very well controlled   Urinary frequency    Weakness    numbness and tingling in right arm down to fingers    Past Surgical History:  Procedure Laterality Date   ANKLE ARTHROPLASTY Right    ANTERIOR CERVICAL DECOMP/DISCECTOMY FUSION N/A 06/28/2016   Procedure: ANTERIOR CERVICAL DECOMPRESSION FUSION CERVICAL THREE-FOUR ,CERVICAL FOUR-FIVE,CERVICAL FIVE-SIX;  Surgeon: Shirlean Kelly, MD;  Location: [facility];  Service: Neurosurgery;  Laterality: N/A;   APPENDECTOMY     COLONOSCOPY     lap inguinal hernia repair wiht mesh Bilateral 10/2003   REFRACTIVE SURGERY  2003   TONSILLECTOMY     wisdom teeth extracted      MEDICATIONS:  amLODipine (NORVASC) 5 MG tablet   ascorbic acid (VITAMIN C) 1000 MG tablet  buPROPion (WELLBUTRIN SR) 150 MG 12 hr tablet   Calcium Carb-Cholecalciferol (CALCIUM 600-D PO)   ezetimibe (ZETIA) 10 MG tablet   famciclovir (FAMVIR) 125 MG tablet   famciclovir (FAMVIR) 125 MG tablet   famotidine (PEPCID) 20 MG tablet   gabapentin (NEURONTIN) 300 MG capsule   losartan (COZAAR) 50 MG tablet   losartan (COZAAR) 50 MG tablet   losartan (COZAAR) 50 MG tablet   methocarbamol (ROBAXIN) 750 MG tablet   methylphenidate (METADATE ER) 20 MG ER tablet   methylphenidate (METADATE ER) 20 MG ER tablet   metoprolol succinate (TOPROL-XL) 25 MG 24 hr tablet   Multiple Vitamin (MULTIVITAMINS  PO)   naproxen (EC NAPROSYN) 500 MG EC tablet   simvastatin (ZOCOR) 40 MG tablet   SYRINGE-NEEDLE, DISP, 3 ML (B-D 3CC LUER-LOK SYR 18GX1-1/2) 18G X 1-1/2" 3 ML MISC   tamsulosin (FLOMAX) 0.4 MG CAPS capsule   testosterone cypionate (DEPOTESTOSTERONE CYPIONATE) 200 MG/ML injection   Vitamin E 268 MG (400 UNIT) CAPS   No current facility-administered medications for this encounter.    Jodell Cipro Ward, PA-C [facility] [phone]

## 2022-06-26 ENCOUNTER — Encounter (HOSPITAL_COMMUNITY): Payer: Self-pay | Admitting: Orthopedic Surgery

## 2022-06-26 ENCOUNTER — Observation Stay (HOSPITAL_COMMUNITY)
Admission: RE | Admit: 2022-06-26 | Discharge: 2022-06-27 | Disposition: A | Discharge status: Home / Self Care | Payer: Medicare Other | Source: Ambulatory Visit | Attending: Orthopedic Surgery | Admitting: Orthopedic Surgery

## 2022-06-26 ENCOUNTER — Ambulatory Visit (HOSPITAL_BASED_OUTPATIENT_CLINIC_OR_DEPARTMENT_OTHER): Payer: Medicare Other | Admitting: Anesthesiology

## 2022-06-26 ENCOUNTER — Other Ambulatory Visit: Payer: Self-pay

## 2022-06-26 ENCOUNTER — Ambulatory Visit (HOSPITAL_COMMUNITY): Payer: Medicare Other | Admitting: Physician Assistant

## 2022-06-26 ENCOUNTER — Encounter (HOSPITAL_COMMUNITY)
Admission: RE | Disposition: A | Discharge status: Home / Self Care | Payer: Self-pay | Source: Ambulatory Visit | Attending: Orthopedic Surgery

## 2022-06-26 DIAGNOSIS — F418 Other specified anxiety disorders: Secondary | ICD-10-CM | POA: Diagnosis not present

## 2022-06-26 DIAGNOSIS — G8918 Other acute postprocedural pain: Secondary | ICD-10-CM | POA: Diagnosis not present

## 2022-06-26 DIAGNOSIS — M1712 Unilateral primary osteoarthritis, left knee: Principal | ICD-10-CM | POA: Diagnosis present

## 2022-06-26 DIAGNOSIS — Z96661 Presence of right artificial ankle joint: Secondary | ICD-10-CM | POA: Diagnosis not present

## 2022-06-26 DIAGNOSIS — Z79899 Other long term (current) drug therapy: Secondary | ICD-10-CM | POA: Insufficient documentation

## 2022-06-26 DIAGNOSIS — M179 Osteoarthritis of knee, unspecified: Secondary | ICD-10-CM | POA: Diagnosis present

## 2022-06-26 DIAGNOSIS — I10 Essential (primary) hypertension: Secondary | ICD-10-CM | POA: Insufficient documentation

## 2022-06-26 DIAGNOSIS — G4730 Sleep apnea, unspecified: Secondary | ICD-10-CM | POA: Diagnosis not present

## 2022-06-26 DIAGNOSIS — I340 Nonrheumatic mitral (valve) insufficiency: Secondary | ICD-10-CM | POA: Diagnosis not present

## 2022-06-26 HISTORY — PX: TOTAL KNEE ARTHROPLASTY: SHX125

## 2022-06-26 SURGERY — ARTHROPLASTY, KNEE, TOTAL
Anesthesia: General | Site: Knee | Laterality: Left

## 2022-06-26 MED ORDER — ACETAMINOPHEN 500 MG PO TABS
1000.0000 mg | ORAL_TABLET | Freq: Four times a day (QID) | ORAL | Status: DC
  Administered 2022-06-26 – 2022-06-27 (×3): 1000 mg via ORAL
  Filled 2022-06-26 (×4): qty 2

## 2022-06-26 MED ORDER — FENTANYL CITRATE (PF) 100 MCG/2ML IJ SOLN
INTRAMUSCULAR | Status: AC
  Filled 2022-06-26: qty 2

## 2022-06-26 MED ORDER — FLEET ENEMA 7-19 GM/118ML RE ENEM: 1.0000 | ENEMA | Freq: Once | RECTAL | Status: DC | PRN

## 2022-06-26 MED ORDER — METOPROLOL SUCCINATE ER 25 MG PO TB24
25.0000 mg | ORAL_TABLET | Freq: Every day | ORAL | Status: DC
  Administered 2022-06-27: 25 mg via ORAL
  Filled 2022-06-26: qty 1

## 2022-06-26 MED ORDER — BUPIVACAINE LIPOSOME 1.3 % IJ SUSP
INTRAMUSCULAR | Status: AC
  Filled 2022-06-26: qty 20

## 2022-06-26 MED ORDER — ASPIRIN 81 MG PO CHEW
81.0000 mg | CHEWABLE_TABLET | Freq: Two times a day (BID) | ORAL | Status: DC
  Administered 2022-06-27: 81 mg via ORAL
  Filled 2022-06-26: qty 1

## 2022-06-26 MED ORDER — POVIDONE-IODINE 10 % EX SWAB: 2.0000 | Freq: Once | CUTANEOUS | Status: DC

## 2022-06-26 MED ORDER — FAMCICLOVIR 125 MG PO TABS: 125.0000 mg | ORAL_TABLET | Freq: Every day | ORAL | Status: DC | PRN

## 2022-06-26 MED ORDER — PHENYLEPHRINE HCL (PRESSORS) 10 MG/ML IV SOLN
INTRAVENOUS | Status: DC | PRN
  Administered 2022-06-26: 100 ug via INTRAVENOUS
  Administered 2022-06-26: 160 ug via INTRAVENOUS

## 2022-06-26 MED ORDER — BUPROPION HCL ER (SR) 150 MG PO TB12
150.0000 mg | ORAL_TABLET | Freq: Two times a day (BID) | ORAL | Status: DC
  Administered 2022-06-26: 150 mg via ORAL
  Filled 2022-06-26 (×2): qty 1

## 2022-06-26 MED ORDER — MORPHINE SULFATE (PF) 2 MG/ML IV SOLN: 1.0000 mg | INTRAVENOUS | Status: DC | PRN

## 2022-06-26 MED ORDER — MIDAZOLAM HCL 2 MG/2ML IJ SOLN
INTRAMUSCULAR | Status: AC
  Filled 2022-06-26: qty 2

## 2022-06-26 MED ORDER — METHOCARBAMOL 500 MG IVPB - SIMPLE MED
500.0000 mg | Freq: Four times a day (QID) | INTRAVENOUS | Status: DC | PRN
  Administered 2022-06-26: 500 mg via INTRAVENOUS

## 2022-06-26 MED ORDER — SIMVASTATIN 40 MG PO TABS: 40.0000 mg | ORAL_TABLET | Freq: Every day | ORAL | Status: DC

## 2022-06-26 MED ORDER — EPHEDRINE SULFATE (PRESSORS) 50 MG/ML IJ SOLN
INTRAMUSCULAR | Status: DC | PRN
  Administered 2022-06-26: 10 mg via INTRAVENOUS
  Administered 2022-06-26: 5 mg via INTRAVENOUS
  Administered 2022-06-26: 10 mg via INTRAVENOUS

## 2022-06-26 MED ORDER — METHYLPHENIDATE HCL ER 20 MG PO TBCR: 20.0000 mg | EXTENDED_RELEASE_TABLET | Freq: Every day | ORAL | Status: DC | PRN

## 2022-06-26 MED ORDER — SODIUM CHLORIDE (PF) 0.9 % IJ SOLN
INTRAMUSCULAR | Status: DC | PRN
  Administered 2022-06-26: 10 mL
  Administered 2022-06-26: 50 mL

## 2022-06-26 MED ORDER — HYDROMORPHONE HCL 1 MG/ML IJ SOLN
INTRAMUSCULAR | Status: DC | PRN
  Administered 2022-06-26 (×2): .5 mg via INTRAVENOUS

## 2022-06-26 MED ORDER — CEFAZOLIN SODIUM-DEXTROSE 2-4 GM/100ML-% IV SOLN
2.0000 g | INTRAVENOUS | Status: AC
  Administered 2022-06-26: 2 g via INTRAVENOUS
  Filled 2022-06-26: qty 100

## 2022-06-26 MED ORDER — ACETAMINOPHEN 10 MG/ML IV SOLN
1000.0000 mg | Freq: Four times a day (QID) | INTRAVENOUS | Status: DC
  Administered 2022-06-26: 1000 mg via INTRAVENOUS
  Filled 2022-06-26: qty 100

## 2022-06-26 MED ORDER — DIPHENHYDRAMINE HCL 12.5 MG/5ML PO ELIX: 12.5000 mg | ORAL_SOLUTION | ORAL | Status: DC | PRN

## 2022-06-26 MED ORDER — MIDAZOLAM HCL 2 MG/2ML IJ SOLN
1.0000 mg | INTRAMUSCULAR | Status: DC
  Administered 2022-06-26: 2 mg via INTRAVENOUS

## 2022-06-26 MED ORDER — BISACODYL 10 MG RE SUPP: 10.0000 mg | Freq: Every day | RECTAL | Status: DC | PRN

## 2022-06-26 MED ORDER — TRANEXAMIC ACID-NACL 1000-0.7 MG/100ML-% IV SOLN
1000.0000 mg | INTRAVENOUS | Status: AC
  Administered 2022-06-26: 1000 mg via INTRAVENOUS
  Filled 2022-06-26: qty 100

## 2022-06-26 MED ORDER — ONDANSETRON HCL 4 MG/2ML IJ SOLN
INTRAMUSCULAR | Status: DC | PRN
  Administered 2022-06-26: 4 mg via INTRAVENOUS

## 2022-06-26 MED ORDER — ROCURONIUM BROMIDE 10 MG/ML (PF) SYRINGE
PREFILLED_SYRINGE | INTRAVENOUS | Status: AC
  Filled 2022-06-26: qty 10

## 2022-06-26 MED ORDER — METHOCARBAMOL 500 MG IVPB - SIMPLE MED
INTRAVENOUS | Status: AC
  Filled 2022-06-26: qty 55

## 2022-06-26 MED ORDER — DEXAMETHASONE SODIUM PHOSPHATE 10 MG/ML IJ SOLN
8.0000 mg | Freq: Once | INTRAMUSCULAR | Status: AC
  Administered 2022-06-26: 8 mg via INTRAVENOUS

## 2022-06-26 MED ORDER — DEXAMETHASONE SODIUM PHOSPHATE 4 MG/ML IJ SOLN
INTRAMUSCULAR | Status: DC | PRN
  Administered 2022-06-26: 5 mg via PERINEURAL

## 2022-06-26 MED ORDER — ONDANSETRON HCL 4 MG/2ML IJ SOLN
INTRAMUSCULAR | Status: AC
  Filled 2022-06-26: qty 2

## 2022-06-26 MED ORDER — ATORVASTATIN CALCIUM 20 MG PO TABS
20.0000 mg | ORAL_TABLET | Freq: Every day | ORAL | Status: DC
  Administered 2022-06-27: 20 mg via ORAL
  Filled 2022-06-26: qty 1

## 2022-06-26 MED ORDER — SODIUM CHLORIDE (PF) 0.9 % IJ SOLN
INTRAMUSCULAR | Status: AC
  Filled 2022-06-26: qty 50

## 2022-06-26 MED ORDER — MENTHOL 3 MG MT LOZG: 1.0000 | LOZENGE | OROMUCOSAL | Status: DC | PRN

## 2022-06-26 MED ORDER — ROCURONIUM BROMIDE 10 MG/ML (PF) SYRINGE
PREFILLED_SYRINGE | INTRAVENOUS | Status: DC | PRN
  Administered 2022-06-26: 50 mg via INTRAVENOUS

## 2022-06-26 MED ORDER — SODIUM CHLORIDE 0.9 % IV SOLN: INTRAVENOUS | Status: DC

## 2022-06-26 MED ORDER — LOSARTAN POTASSIUM 50 MG PO TABS
50.0000 mg | ORAL_TABLET | Freq: Every day | ORAL | Status: DC
  Filled 2022-06-26: qty 1

## 2022-06-26 MED ORDER — GLYCOPYRROLATE 0.2 MG/ML IJ SOLN
INTRAMUSCULAR | Status: DC | PRN
  Administered 2022-06-26: .2 mg via INTRAVENOUS

## 2022-06-26 MED ORDER — FENTANYL CITRATE PF 50 MCG/ML IJ SOSY
50.0000 ug | PREFILLED_SYRINGE | INTRAMUSCULAR | Status: DC
  Administered 2022-06-26: 50 ug via INTRAVENOUS
  Filled 2022-06-26: qty 2

## 2022-06-26 MED ORDER — EZETIMIBE 10 MG PO TABS
10.0000 mg | ORAL_TABLET | Freq: Every day | ORAL | Status: DC
  Administered 2022-06-27: 10 mg via ORAL
  Filled 2022-06-26: qty 1

## 2022-06-26 MED ORDER — BUPIVACAINE LIPOSOME 1.3 % IJ SUSP: 20.0000 mL | Freq: Once | INTRAMUSCULAR | Status: DC

## 2022-06-26 MED ORDER — ONDANSETRON HCL 4 MG PO TABS: 4.0000 mg | ORAL_TABLET | Freq: Four times a day (QID) | ORAL | Status: DC | PRN

## 2022-06-26 MED ORDER — FENTANYL CITRATE (PF) 100 MCG/2ML IJ SOLN
INTRAMUSCULAR | Status: DC | PRN
  Administered 2022-06-26 (×2): 50 ug via INTRAVENOUS

## 2022-06-26 MED ORDER — SODIUM CHLORIDE (PF) 0.9 % IJ SOLN
INTRAMUSCULAR | Status: AC
  Filled 2022-06-26: qty 10

## 2022-06-26 MED ORDER — METHOCARBAMOL 500 MG PO TABS
500.0000 mg | ORAL_TABLET | Freq: Four times a day (QID) | ORAL | Status: DC | PRN
  Administered 2022-06-26 – 2022-06-27 (×3): 500 mg via ORAL
  Filled 2022-06-26 (×3): qty 1

## 2022-06-26 MED ORDER — STERILE WATER FOR IRRIGATION IR SOLN
Status: DC | PRN
  Administered 2022-06-26 (×2): 1000 mL

## 2022-06-26 MED ORDER — BUPIVACAINE LIPOSOME 1.3 % IJ SUSP
INTRAMUSCULAR | Status: DC | PRN
  Administered 2022-06-26: 20 mL

## 2022-06-26 MED ORDER — AMLODIPINE BESYLATE 5 MG PO TABS
5.0000 mg | ORAL_TABLET | Freq: Every day | ORAL | Status: DC
  Filled 2022-06-26: qty 1

## 2022-06-26 MED ORDER — ONDANSETRON HCL 4 MG/2ML IJ SOLN
4.0000 mg | Freq: Four times a day (QID) | INTRAMUSCULAR | Status: DC | PRN
  Administered 2022-06-27: 4 mg via INTRAVENOUS
  Filled 2022-06-26: qty 2

## 2022-06-26 MED ORDER — POLYETHYLENE GLYCOL 3350 17 G PO PACK: 17.0000 g | PACK | Freq: Every day | ORAL | Status: DC | PRN

## 2022-06-26 MED ORDER — ROPIVACAINE HCL 5 MG/ML IJ SOLN
INTRAMUSCULAR | Status: DC | PRN
  Administered 2022-06-26: 30 mL via PERINEURAL

## 2022-06-26 MED ORDER — DEXAMETHASONE SODIUM PHOSPHATE 10 MG/ML IJ SOLN
10.0000 mg | Freq: Once | INTRAMUSCULAR | Status: AC
  Administered 2022-06-27: 10 mg via INTRAVENOUS
  Filled 2022-06-26: qty 1

## 2022-06-26 MED ORDER — SUGAMMADEX SODIUM 200 MG/2ML IV SOLN
INTRAVENOUS | Status: DC | PRN
  Administered 2022-06-26: 200 mg via INTRAVENOUS

## 2022-06-26 MED ORDER — ORAL CARE MOUTH RINSE: 15.0000 mL | Freq: Once | OROMUCOSAL | Status: AC

## 2022-06-26 MED ORDER — SODIUM CHLORIDE 0.9 % IR SOLN
Status: DC | PRN
  Administered 2022-06-26: 1000 mL

## 2022-06-26 MED ORDER — TRAMADOL HCL 50 MG PO TABS: 50.0000 mg | ORAL_TABLET | Freq: Four times a day (QID) | ORAL | Status: DC | PRN

## 2022-06-26 MED ORDER — DEXAMETHASONE SODIUM PHOSPHATE 10 MG/ML IJ SOLN
INTRAMUSCULAR | Status: AC
  Filled 2022-06-26: qty 1

## 2022-06-26 MED ORDER — OXYCODONE HCL 5 MG PO TABS
5.0000 mg | ORAL_TABLET | ORAL | Status: DC | PRN
  Administered 2022-06-27: 10 mg via ORAL
  Filled 2022-06-26 (×4): qty 2

## 2022-06-26 MED ORDER — HYDROMORPHONE HCL 2 MG/ML IJ SOLN
INTRAMUSCULAR | Status: AC
  Filled 2022-06-26: qty 1

## 2022-06-26 MED ORDER — METOCLOPRAMIDE HCL 5 MG PO TABS: 5.0000 mg | ORAL_TABLET | Freq: Three times a day (TID) | ORAL | Status: DC | PRN

## 2022-06-26 MED ORDER — CEFAZOLIN SODIUM-DEXTROSE 2-4 GM/100ML-% IV SOLN
2.0000 g | Freq: Four times a day (QID) | INTRAVENOUS | Status: AC
  Administered 2022-06-26 (×2): 2 g via INTRAVENOUS
  Filled 2022-06-26 (×2): qty 100

## 2022-06-26 MED ORDER — EPHEDRINE 5 MG/ML INJ
INTRAVENOUS | Status: AC
  Filled 2022-06-26: qty 5

## 2022-06-26 MED ORDER — PHENOL 1.4 % MT LIQD: 1.0000 | OROMUCOSAL | Status: DC | PRN

## 2022-06-26 MED ORDER — LACTATED RINGERS IV SOLN: INTRAVENOUS | Status: DC

## 2022-06-26 MED ORDER — CLONIDINE HCL (ANALGESIA) 100 MCG/ML EP SOLN
EPIDURAL | Status: DC | PRN
  Administered 2022-06-26: 80 ug

## 2022-06-26 MED ORDER — PROPOFOL 10 MG/ML IV BOLUS
INTRAVENOUS | Status: DC | PRN
  Administered 2022-06-26: 100 mg via INTRAVENOUS

## 2022-06-26 MED ORDER — FAMOTIDINE 20 MG PO TABS: 20.0000 mg | ORAL_TABLET | Freq: Every day | ORAL | Status: DC | PRN

## 2022-06-26 MED ORDER — METOCLOPRAMIDE HCL 5 MG/ML IJ SOLN: 5.0000 mg | Freq: Three times a day (TID) | INTRAMUSCULAR | Status: DC | PRN

## 2022-06-26 MED ORDER — LIDOCAINE 2% (20 MG/ML) 5 ML SYRINGE
INTRAMUSCULAR | Status: DC | PRN
  Administered 2022-06-26: 60 mg via INTRAVENOUS

## 2022-06-26 MED ORDER — DOCUSATE SODIUM 100 MG PO CAPS
100.0000 mg | ORAL_CAPSULE | Freq: Two times a day (BID) | ORAL | Status: DC
  Administered 2022-06-26 – 2022-06-27 (×2): 100 mg via ORAL
  Filled 2022-06-26 (×2): qty 1

## 2022-06-26 MED ORDER — OXYCODONE HCL 5 MG PO TABS
ORAL_TABLET | ORAL | Status: AC
  Administered 2022-06-26: 10 mg via ORAL
  Filled 2022-06-26: qty 2

## 2022-06-26 MED ORDER — LIDOCAINE HCL (PF) 2 % IJ SOLN
INTRAMUSCULAR | Status: AC
  Filled 2022-06-26: qty 5

## 2022-06-26 MED ORDER — CHLORHEXIDINE GLUCONATE 0.12 % MT SOLN
15.0000 mL | Freq: Once | OROMUCOSAL | Status: AC
  Administered 2022-06-26: 15 mL via OROMUCOSAL

## 2022-06-26 MED ORDER — GABAPENTIN 300 MG PO CAPS: 300.0000 mg | ORAL_CAPSULE | Freq: Every day | ORAL | Status: DC | PRN

## 2022-06-26 MED ORDER — TAMSULOSIN HCL 0.4 MG PO CAPS
0.4000 mg | ORAL_CAPSULE | Freq: Every day | ORAL | Status: DC
  Administered 2022-06-27: 0.4 mg via ORAL
  Filled 2022-06-26: qty 1

## 2022-06-26 SURGICAL SUPPLY — 63 items
ADH SKN CLS APL DERMABOND .7 (GAUZE/BANDAGES/DRESSINGS) ×1
ADH SKN CLS LQ APL DERMABOND (GAUZE/BANDAGES/DRESSINGS) ×1
ATTUNE MED DOME PAT 41 KNEE (Knees) IMPLANT
ATTUNE PS FEM LT SZ 6 CEM KNEE (Femur) IMPLANT
ATTUNE PSRP INSR SZ6 10 KNEE (Insert) IMPLANT
BAG COUNTER SPONGE SURGICOUNT (BAG) IMPLANT
BAG SPEC THK2 15X12 ZIP CLS (MISCELLANEOUS) ×1
BAG SPNG CNTER NS LX DISP (BAG)
BAG ZIPLOCK 12X15 (MISCELLANEOUS) ×1 IMPLANT
BASE TIBIAL ROT PLAT SZ 7 KNEE (Knees) IMPLANT
BLADE SAG 18X100X1.27 (BLADE) ×1 IMPLANT
BLADE SAW SGTL 11.0X1.19X90.0M (BLADE) ×1 IMPLANT
BNDG CMPR 5X62 HK CLSR LF (GAUZE/BANDAGES/DRESSINGS) ×1
BNDG CMPR MED 10X6 ELC LF (GAUZE/BANDAGES/DRESSINGS) ×1
BNDG ELASTIC 6INX 5YD STR LF (GAUZE/BANDAGES/DRESSINGS) ×1 IMPLANT
BNDG ELASTIC 6X10 VLCR STRL LF (GAUZE/BANDAGES/DRESSINGS) IMPLANT
BOWL SMART MIX CTS (DISPOSABLE) ×1 IMPLANT
BSPLAT TIB 7 CMNT ROT PLAT STR (Knees) ×1 IMPLANT
CEMENT HV SMART SET (Cement) ×2 IMPLANT
COVER SURGICAL LIGHT HANDLE (MISCELLANEOUS) ×1 IMPLANT
CUFF TOURN SGL QUICK 34 (TOURNIQUET CUFF) ×1
CUFF TRNQT CYL 34X4.125X (TOURNIQUET CUFF) ×1 IMPLANT
DERMABOND ADVANCED .7 DNX12 (GAUZE/BANDAGES/DRESSINGS) ×1 IMPLANT
DERMABOND ADVANCED .7 DNX6 (GAUZE/BANDAGES/DRESSINGS) IMPLANT
DRAPE INCISE IOBAN 66X45 STRL (DRAPES) ×1 IMPLANT
DRAPE U-SHAPE 47X51 STRL (DRAPES) ×1 IMPLANT
DRSG AQUACEL AG ADV 3.5X10 (GAUZE/BANDAGES/DRESSINGS) ×1 IMPLANT
DURAPREP 26ML APPLICATOR (WOUND CARE) ×1 IMPLANT
ELECT REM PT RETURN 15FT ADLT (MISCELLANEOUS) ×1 IMPLANT
GLOVE BIO SURGEON STRL SZ 6.5 (GLOVE) IMPLANT
GLOVE BIO SURGEON STRL SZ7.5 (GLOVE) IMPLANT
GLOVE BIO SURGEON STRL SZ8 (GLOVE) ×1 IMPLANT
GLOVE BIOGEL PI IND STRL 6.5 (GLOVE) IMPLANT
GLOVE BIOGEL PI IND STRL 7.0 (GLOVE) IMPLANT
GLOVE BIOGEL PI IND STRL 8 (GLOVE) ×1 IMPLANT
GOWN STRL REUS W/ TWL LRG LVL3 (GOWN DISPOSABLE) ×1 IMPLANT
GOWN STRL REUS W/ TWL XL LVL3 (GOWN DISPOSABLE) IMPLANT
GOWN STRL REUS W/TWL LRG LVL3 (GOWN DISPOSABLE) ×1
GOWN STRL REUS W/TWL XL LVL3 (GOWN DISPOSABLE)
HANDPIECE INTERPULSE COAX TIP (DISPOSABLE) ×1
HOLDER FOLEY CATH W/STRAP (MISCELLANEOUS) IMPLANT
IMMOBILIZER KNEE 20 (SOFTGOODS) ×1
IMMOBILIZER KNEE 20 THIGH 36 (SOFTGOODS) ×1 IMPLANT
IMMOBILIZER KNEE 22 UNIV (SOFTGOODS) IMPLANT
KIT TURNOVER KIT A (KITS) IMPLANT
MANIFOLD NEPTUNE II (INSTRUMENTS) ×1 IMPLANT
NS IRRIG 1000ML POUR BTL (IV SOLUTION) ×1 IMPLANT
PACK TOTAL KNEE CUSTOM (KITS) ×1 IMPLANT
PADDING CAST COTTON 6X4 STRL (CAST SUPPLIES) ×2 IMPLANT
PIN STEINMAN FIXATION KNEE (PIN) IMPLANT
PROTECTOR NERVE ULNAR (MISCELLANEOUS) ×1 IMPLANT
SET HNDPC FAN SPRY TIP SCT (DISPOSABLE) ×1 IMPLANT
SPIKE FLUID TRANSFER (MISCELLANEOUS) ×1 IMPLANT
SUT MNCRL AB 4-0 PS2 18 (SUTURE) ×1 IMPLANT
SUT STRATAFIX 0 PDS 27 VIOLET (SUTURE) ×1
SUT VIC AB 2-0 CT1 27 (SUTURE) ×3
SUT VIC AB 2-0 CT1 TAPERPNT 27 (SUTURE) ×3 IMPLANT
SUTURE STRATFX 0 PDS 27 VIOLET (SUTURE) ×1 IMPLANT
TIBIAL BASE ROT PLAT SZ 7 KNEE (Knees) ×1 IMPLANT
TRAY FOLEY MTR SLVR 16FR STAT (SET/KITS/TRAYS/PACK) ×1 IMPLANT
TUBE SUCTION HIGH CAP CLEAR NV (SUCTIONS) ×1 IMPLANT
WATER STERILE IRR 1000ML POUR (IV SOLUTION) ×2 IMPLANT
WRAP KNEE MAXI GEL POST OP (GAUZE/BANDAGES/DRESSINGS) ×1 IMPLANT

## 2022-06-26 NOTE — Op Note (Signed)
OPERATIVE REPORT-TOTAL KNEE ARTHROPLASTY   Pre-operative diagnosis- Osteoarthritis  Left knee(s)  Post-operative diagnosis- Osteoarthritis Left knee(s)  Procedure-  Left  Total Knee Arthroplasty  Surgeon- Gus Rankin. Jawana Reagor, MD  Assistant- Weston Brass, PA-C   Anesthesia-  Adductor canal block and general  EBL-30 mL   Drains None  Tourniquet time-  Total Tourniquet Time Documented: Thigh (Left) - 41 minutes Total: Thigh (Left) - 41 minutes     Complications- None  Condition-PACU - hemodynamically stable.   Brief Clinical Note   Joshua [name] is a 72 y.o. year old male with end stage OA of his left knee with progressively worsening pain and dysfunction. He has constant pain, with activity and at rest and significant functional deficits with difficulties even with ADLs. He has had extensive non-op management including analgesics, injections of cortisone and viscosupplements, and home exercise program, but remains in significant pain with significant dysfunction. Radiographs show bone on bone arthritis medial and patellofemoral. He presents now for left Total Knee Arthroplasty.     Procedure in detail---   The patient is brought into the operating room and positioned supine on the operating table. After successful administration of  Adductor canal block and general,   a tourniquet is placed high on the  Left thigh(s) and the lower extremity is prepped and draped in the usual sterile fashion. Time out is performed by the operating team and then the  Left lower extremity is wrapped in Esmarch, knee flexed and the tourniquet inflated to 300 mmHg.       A midline incision is made with a ten blade through the subcutaneous tissue to the level of the extensor mechanism. A fresh blade is used to make a medial parapatellar arthrotomy. Soft tissue over the proximal medial tibia is subperiosteally elevated to the joint line with a knife and into the semimembranosus bursa with a Cobb  elevator. Soft tissue over the proximal lateral tibia is elevated with attention being paid to avoiding the patellar tendon on the tibial tubercle. The patella is everted, knee flexed 90 degrees and the ACL and PCL are removed. Findings are bone on bone medial and patellofemoral with massive global osteophytes        The drill is used to create a starting hole in the distal femur and the canal is thoroughly irrigated with sterile saline to remove the fatty contents. The 5 degree Left  valgus alignment guide is placed into the femoral canal and the distal femoral cutting block is pinned to remove 9 mm off the distal femur. Resection is made with an oscillating saw.      The tibia is subluxed forward and the menisci are removed. The extramedullary alignment guide is placed referencing proximally at the medial aspect of the tibial tubercle and distally along the second metatarsal axis and tibial crest. The block is pinned to remove 2mm off the more deficient medial  side. Resection is made with an oscillating saw. Size 7is the most appropriate size for the tibia and the proximal tibia is prepared with the modular drill and keel punch for that size.      The femoral sizing guide is placed and size 6 is most appropriate. Rotation is marked off the epicondylar axis and confirmed by creating a rectangular flexion gap at 90 degrees. The size 6 cutting block is pinned in this rotation and the anterior, posterior and chamfer cuts are made with the oscillating saw. The intercondylar block is then placed and that cut is  made.      Trial size 7 tibial component, trial size 6 posterior stabilized femur and a 10  mm posterior stabilized rotating platform insert trial is placed. Full extension is achieved with excellent varus/valgus and anterior/posterior balance throughout full range of motion. The patella is everted and thickness measured to be 27  mm. Free hand resection is taken to 15 mm, a 41 template is placed, lug holes  are drilled, trial patella is placed, and it tracks normally. Osteophytes are removed off the posterior femur with the trial in place. All trials are removed and the cut bone surfaces prepared with pulsatile lavage. Cement is mixed and once ready for implantation, the size 7 tibial implant, size  6 posterior stabilized femoral component, and the size 41 patella are cemented in place and the patella is held with the clamp. The trial insert is placed and the knee held in full extension. The Exparel (20 ml mixed with 60 ml saline) is injected into the extensor mechanism, posterior capsule, medial and lateral gutters and subcutaneous tissues.  All extruded cement is removed and once the cement is hard the permanent 10 mm posterior stabilized rotating platform insert is placed into the tibial tray.      The wound is copiously irrigated with saline solution and the extensor mechanism closed with # 0 Stratofix suture. The tourniquet is released for a total tourniquet time of 41  minutes. Flexion against gravity is 140 degrees and the patella tracks normally. Subcutaneous tissue is closed with 2.0 vicryl and subcuticular with running 4.0 Monocryl. The incision is cleaned and dried and steri-strips and a bulky sterile dressing are applied. The limb is placed into a knee immobilizer and the patient is awakened and transported to recovery in stable condition.      Please note that a surgical assistant was a medical necessity for this procedure in order to perform it in a safe and expeditious manner. Surgical assistant was necessary to retract the ligaments and vital neurovascular structures to prevent injury to them and also necessary for proper positioning of the limb to allow for anatomic placement of the prosthesis.   Gus Rankin Joshua Mortellaro, MD    06/26/2022, 1:21 PM

## 2022-06-26 NOTE — Progress Notes (Signed)
Orthopedic Tech Progress Note Patient Details:  Joshua [name] [birth year] [id number]  CPM Left Knee CPM Left Knee: On Left Knee Flexion (Degrees): 40 Left Knee Extension (Degrees): 10  Post Interventions Patient Tolerated: Well  [name] 06/26/2022, 2:23 PM

## 2022-06-26 NOTE — Anesthesia Procedure Notes (Signed)
Anesthesia Regional Block: Adductor canal block   Pre-Anesthetic Checklist: , timeout performed,  Correct Patient, Correct Site, Correct Laterality,  Correct Procedure, Correct Position, site marked,  Risks and benefits discussed,  Surgical consent,  Pre-op evaluation,  At surgeon's request and post-op pain management  Laterality: Lower and Left  Prep: chloraprep       Needles:  Injection technique: Single-shot  Needle Type: Stimiplex     Needle Length: 9cm  Needle Gauge: 21     Additional Needles:   Procedures:,,,, ultrasound used (permanent image in chart),,    Narrative:  Start time: 06/26/2022 11:06 AM End time: 06/26/2022 11:26 AM Injection made incrementally with aspirations every 5 mL.  Performed by: Personally  Anesthesiologist: Lewie Loron, MD  Additional Notes: BP cuff, EKG monitors applied. Sedation begun. Artery and nerve location verified with ultrasound. Anesthetic injected incrementally (5ml), slowly, and after negative aspirations under direct u/s guidance. Good fascial/perineural spread. Tolerated well.

## 2022-06-26 NOTE — Anesthesia Preprocedure Evaluation (Addendum)
Anesthesia Evaluation  Patient identified by MRN, date of birth, ID band Patient awake    Reviewed: Allergy & Precautions, NPO status , Patient's Chart, lab work & pertinent test results  History of Anesthesia Complications (+) history of anesthetic complications  Airway Mallampati: II  TM Distance: >3 FB Neck ROM: Full    Dental  (+) Dental Advisory Given, Teeth Intact, Chipped,    Pulmonary sleep apnea    Pulmonary exam normal breath sounds clear to auscultation       Cardiovascular hypertension, Pt. on home beta blockers and Pt. on medications + Valvular Problems/Murmurs MR  Rhythm:Regular Rate:Normal + Systolic murmurs Echocardiogram 02/17/2022:  Left ventricle cavity is normal in size and wall thickness. Normal global wall motion. Normal LV systolic function with EF 53%. Doppler evidence of grade I (impaired) diastolic dysfunction, normal LAP.  Trileaflet aortic valve. Moderate aortic valve leaflet calcification. Aortic sclerosis. Trace aortic regurgitation.  Mild to moderate mitral regurgitation.  Mild tricuspid regurgitation.  Mild pulmonic regurgitation.  No evidence of pulmonary hypertension.     Neuro/Psych  PSYCHIATRIC DISORDERS Anxiety Depression    negative neurological ROS     GI/Hepatic Neg liver ROS,GERD  ,,  Endo/Other  negative endocrine ROS    Renal/GU negative Renal ROS     Musculoskeletal  (+) Arthritis ,    Abdominal   Peds  Hematology  (+) Blood dyscrasia, anemia   Anesthesia Other Findings   Reproductive/Obstetrics                             Anesthesia Physical Anesthesia Plan  ASA: 3  Anesthesia Plan: General   Post-op Pain Management: Ofirmev IV (intra-op)* and Regional block*   Induction: Intravenous  PONV Risk Score and Plan: 3 and Ondansetron, Treatment may vary due to age or medical condition, Dexamethasone, Propofol infusion and TIVA  Airway  Management Planned: Oral ETT  Additional Equipment:   Intra-op Plan:   Post-operative Plan: Extubation in OR  Informed Consent: I have reviewed the patients History and Physical, chart, labs and discussed the procedure including the risks, benefits and alternatives for the proposed anesthesia with the patient or authorized representative who has indicated his/her understanding and acceptance.     Dental advisory given  Plan Discussed with: CRNA  Anesthesia Plan Comments: (Given patient's recent extensive back surgery/fusion, I discussed GETA as being my recommendation for anesthesia. Pt agrees and consents. )        Anesthesia Quick Evaluation

## 2022-06-26 NOTE — Interval H&P Note (Signed)
History and Physical Interval Note:  06/26/2022 9:33 AM  Joshua [name]  has presented today for surgery, with the diagnosis of left knee osteoarthritis.  The various methods of treatment have been discussed with the patient and family. After consideration of risks, benefits and other options for treatment, the patient has consented to  Procedure(s): TOTAL KNEE ARTHROPLASTY (Left) as a surgical intervention.  The patient's history has been reviewed, patient examined, no change in status, stable for surgery.  I have reviewed the patient's chart and labs.  Questions were answered to the patient's satisfaction.     [name]

## 2022-06-26 NOTE — Progress Notes (Signed)
Orthopedic Tech Progress Note Patient Details:  Joshua [name] April 06, 1950 [id number]  CPM Left Knee CPM Left Knee: Off Left Knee Flexion (Degrees): 40 Left Knee Extension (Degrees): 10  Post Interventions Patient Tolerated: Well CPM removed by PT. [name] 06/26/2022, 5:30 PM

## 2022-06-26 NOTE — Anesthesia Procedure Notes (Signed)
Procedure Name: Intubation Date/Time: 06/26/2022 12:21 PM  Performed by: Johnette Abraham, CRNAPre-anesthesia Checklist: Patient identified, Emergency Drugs available, Suction available and Patient being monitored Patient Re-evaluated:Patient Re-evaluated prior to induction Oxygen Delivery Method: Circle System Utilized Preoxygenation: Pre-oxygenation with 100% oxygen Induction Type: IV induction Ventilation: Mask ventilation without difficulty Laryngoscope Size: Mac and 4 Grade View: Grade I Tube type: Oral Tube size: 8.0 mm Number of attempts: 1 Airway Equipment and Method: Stylet and Oral airway Placement Confirmation: ETT inserted through vocal cords under direct vision, positive ETCO2 and breath sounds checked- equal and bilateral Secured at: 24 cm Tube secured with: Tape Dental Injury: Teeth and Oropharynx as per pre-operative assessment  Comments: EMT student performed the DL, atraumatic intubationX1

## 2022-06-26 NOTE — Transfer of Care (Signed)
Immediate Anesthesia Transfer of Care Note  Patient: Joshua [name]  Procedure(s) Performed: TOTAL KNEE ARTHROPLASTY (Left: Knee)  Patient Location: PACU  Anesthesia Type:GA combined with regional for post-op pain  Level of Consciousness: awake, sedated, and patient cooperative  Airway & Oxygen Therapy: Patient Spontanous Breathing and Patient connected to face mask oxygen  Post-op Assessment: Report given to RN and Post -op Vital signs reviewed and stable  Post vital signs: Reviewed and stable  Last Vitals:  Vitals Value Taken Time  BP 127/81 06/26/22 1403  Temp    Pulse 64 06/26/22 1407  Resp 10 06/26/22 1407  SpO2 100 % 06/26/22 1407  Vitals shown include unvalidated device data.  Last Pain:  Vitals:   06/26/22 1137  TempSrc:   PainSc: 0-No pain         Complications: No notable events documented.

## 2022-06-26 NOTE — Evaluation (Signed)
Physical Therapy Evaluation Patient Details Name: Joshua [name] MRN: [id number] DOB: 1950/02/18 Today's Date: 06/26/2022  History of Present Illness  72 yo male presents to therapy s/p L TKA on 5/20/204 due to failure of conservative measures. Pt PMH includes but is not limited to: mitral valve regurgitation, AAA, OSA with poor compliance with CPAP, HTN, cervical herniated nucleus pulposus s/p ACDF, and hx of lumbar sx 02/2022 and pars defect.  Clinical Impression    Joshua [name] is a 72 y.o. male POD 0 s/p L TKA. Patient reports IND with mobility at baseline. Patient is now limited by functional impairments (see PT problem list below) and requires S for bed mobility and min guard and cues for transfers. Patient was able to ambulate 45 feet with RW and min guard level of assist. Patient instructed in exercise to facilitate ROM and circulation to manage edema. Patient will benefit from continued skilled PT interventions to address impairments and progress towards PLOF. Acute PT will follow to progress mobility and stair training in preparation for safe discharge home with family assist and reports OPPT.       Recommendations for follow up therapy are one component of a multi-disciplinary discharge planning process, led by the attending physician.  Recommendations may be updated based on patient status, additional functional criteria and insurance authorization.  Follow Up Recommendations       Assistance Recommended at Discharge Intermittent Supervision/Assistance  Patient can return home with the following  A little help with walking and/or transfers;A little help with bathing/dressing/bathroom;Assistance with cooking/housework;Assist for transportation;Help with stairs or ramp for entrance    Equipment Recommendations None recommended by PT (pt reports DME in home setting)  Recommendations for Other Services       Functional Status Assessment Patient has had a recent decline in their  functional status and demonstrates the ability to make significant improvements in function in a reasonable and predictable amount of time.     Precautions / Restrictions Precautions Precautions: Knee;Fall Restrictions Weight Bearing Restrictions: No      Mobility  Bed Mobility Overal bed mobility: Needs Assistance Bed Mobility: Supine to Sit     Supine to sit: Supervision     General bed mobility comments: HOB elevated and incresaed time with cues    Transfers Overall transfer level: Needs assistance Equipment used: Rolling walker (2 wheels) Transfers: Sit to/from Stand Sit to Stand: Min guard           General transfer comment: cues for proper UE placement    Ambulation/Gait Ambulation/Gait assistance: Min guard Gait Distance (Feet): 45 Feet Assistive device: Rolling walker (2 wheels) Gait Pattern/deviations: Step-to pattern, Antalgic Gait velocity: decreased     General Gait Details: reports of dizziness with gait tasks  Stairs            Wheelchair Mobility    Modified Rankin (Stroke Patients Only)       Balance Overall balance assessment: Needs assistance Sitting-balance support: Feet supported Sitting balance-Leahy Scale: Good     Standing balance support: Bilateral upper extremity supported, During functional activity, Reliant on assistive device for balance Standing balance-Leahy Scale: Poor                               Pertinent Vitals/Pain Pain Assessment Pain Assessment: 0-10 Pain Score: 2  Pain Location: L knee Pain Descriptors / Indicators: Aching, Constant, Discomfort, Operative site guarding Pain Intervention(s): Limited activity within patient's tolerance,  Monitored during session, Premedicated before session, Repositioned, Ice applied    Home Living Family/patient expects to be discharged to:: Private residence Living Arrangements: Spouse/significant other Available Help at Discharge: Family Type of Home:  House Home Access: Stairs to enter Entrance Stairs-Rails: Left Entrance Stairs-Number of Steps: 2   Home Layout: One level Home Equipment: Agricultural consultant (2 wheels);Toilet riser;Shower seat      Prior Function Prior Level of Function : Independent/Modified Independent             Mobility Comments: IND with all ADLs, self care tasks, IADLs, driving       Hand Dominance        Extremity/Trunk Assessment        Lower Extremity Assessment Lower Extremity Assessment: LLE deficits/detail LLE Deficits / Details: ankle DF 5/5, PF 4/5 ;SLR < 10 degree lag LLE Sensation: WNL    Cervical / Trunk Assessment Cervical / Trunk Assessment:  (wfl)  Communication   Communication: No difficulties  Cognition Arousal/Alertness: Lethargic, Suspect due to medications Behavior During Therapy: WFL for tasks assessed/performed Overall Cognitive Status: Within Functional Limits for tasks assessed                                          General Comments      Exercises Total Joint Exercises Ankle Circles/Pumps: AROM, Both, 15 reps   Assessment/Plan    PT Assessment Patient needs continued PT services  PT Problem List Decreased strength;Decreased range of motion;Decreased activity tolerance;Decreased balance;Decreased mobility;Decreased coordination;Pain       PT Treatment Interventions DME instruction;Gait training;Stair training;Functional mobility training;Therapeutic activities;Therapeutic exercise;Neuromuscular re-education;Balance training;Patient/family education;Modalities    PT Goals (Current goals can be found in the Care Plan section)  Acute Rehab PT Goals Patient Stated Goal: to get back to workingout PT Goal Formulation: With patient Time For Goal Achievement: 07/10/22 Potential to Achieve Goals: Good    Frequency 7X/week     Co-evaluation               AM-PAC PT "6 Clicks" Mobility  Outcome Measure Help needed turning from your back  to your side while in a flat bed without using bedrails?: None Help needed moving from lying on your back to sitting on the side of a flat bed without using bedrails?: A Little Help needed moving to and from a bed to a chair (including a wheelchair)?: A Little Help needed standing up from a chair using your arms (e.g., wheelchair or bedside chair)?: A Little Help needed to walk in hospital room?: A Little Help needed climbing 3-5 steps with a railing? : A Lot 6 Click Score: 18    End of Session Equipment Utilized During Treatment: Gait belt Activity Tolerance: No increased pain;Patient limited by lethargy Patient left: in chair;with call bell/phone within reach;with family/visitor present Nurse Communication: Mobility status PT Visit Diagnosis: Unsteadiness on feet (R26.81);Other abnormalities of gait and mobility (R26.89);Muscle weakness (generalized) (M62.81);Difficulty in walking, not elsewhere classified (R26.2);Pain Pain - Right/Left: Left Pain - part of body: Knee    Time: 1610-9604 PT Time Calculation (min) (ACUTE ONLY): 23 min   Charges:   PT Evaluation $PT Eval Low Complexity: 1 Low PT Treatments $Gait Training: 8-22 mins        Johnny Bridge, PT Acute Rehab   Jacqualyn Posey 06/26/2022, 4:28 PM

## 2022-06-26 NOTE — Discharge Instructions (Signed)
 Frank Aluisio, MD Total Joint Specialist [facility] [address] [place], [place] [address] [phone]  TOTAL KNEE REPLACEMENT POSTOPERATIVE DIRECTIONS    Knee Rehabilitation, Guidelines Following Surgery  Results after knee surgery are often greatly improved when you follow the exercise, range of motion and muscle strengthening exercises prescribed by your doctor. Safety measures are also important to protect the knee from further injury. If any of these exercises cause you to have increased pain or swelling in your knee joint, decrease the amount until you are comfortable again and slowly increase them. If you have problems or questions, call your caregiver or physical therapist for advice.   BLOOD CLOT PREVENTION Take 81 mg Aspirin two times a day for three weeks following surgery. Then take an 81 mg Aspirin once a day for three weeks. Then discontinue Aspirin. You may resume your vitamins/supplements upon discharge from the hospital. Do not take any NSAIDs (Advil, Aleve, Ibuprofen, Meloxicam, etc.) for 3 weeks, while taking 81mg Aspirin twice a day.   HOME CARE INSTRUCTIONS  Remove items at home which could result in a fall. This includes throw rugs or furniture in walking pathways.  ICE to the affected knee as much as tolerated. Icing helps control swelling. If the swelling is well controlled you will be more comfortable and rehab easier. Continue to use ice on the knee for pain and swelling from surgery. You may notice swelling that will progress down to the foot and ankle. This is normal after surgery. Elevate the leg when you are not up walking on it.    Continue to use the breathing machine which will help keep your temperature down. It is common for your temperature to cycle up and down following surgery, especially at night when you are not up moving around and exerting yourself. The breathing machine keeps your lungs expanded and your temperature  down. Do not place pillow under the operative knee, focus on keeping the knee straight while resting  DIET You may resume your previous home diet once you are discharged from the hospital.  DRESSING / WOUND CARE / SHOWERING Keep your bulky bandage on for 2 days. On the third post-operative day you may remove the Ace bandage and gauze. There is a waterproof adhesive bandage on your skin which will stay in place until your first follow-up appointment. Once you remove this you will not need to place another bandage You may begin showering 3 days following surgery, but do not submerge the incision under water.  ACTIVITY For the first 5 days, the key is rest and control of pain and swelling Do your home exercises twice a day starting on post-operative day 3. On the days you go to physical therapy, just do the home exercises once that day. You should rest, ice and elevate the leg for 50 minutes out of every hour. Get up and walk/stretch for 10 minutes per hour. After 5 days you can increase your activity slowly as tolerated. Walk with your walker as instructed. Use the walker until you are comfortable transitioning to a cane. Walk with the cane in the opposite hand of the operative leg. You may discontinue the cane once you are comfortable and walking steadily. Avoid periods of inactivity such as sitting longer than an hour when not asleep. This helps prevent blood clots.  You may discontinue the knee immobilizer once you are able to perform a straight leg raise while lying down. You may resume a sexual relationship in   one month or when given the OK by your doctor.  You may return to work once you are cleared by your doctor.  Do not drive a car for 6 weeks or until released by your surgeon.  Do not drive while taking narcotics.  TED HOSE STOCKINGS Wear the elastic stockings on both legs for three weeks following surgery during the day. You may remove them at night for sleeping.  WEIGHT  BEARING Weight bearing as tolerated with assist device (walker, cane, etc) as directed, use it as long as suggested by your surgeon or therapist, typically at least 4-6 weeks.  POSTOPERATIVE CONSTIPATION PROTOCOL Constipation - defined medically as fewer than three stools per week and severe constipation as less than one stool per week.  One of the most common issues patients have following surgery is constipation.  Even if you have a regular bowel pattern at home, your normal regimen is likely to be disrupted due to multiple reasons following surgery.  Combination of anesthesia, postoperative narcotics, change in appetite and fluid intake all can affect your bowels.  In order to avoid complications following surgery, here are some recommendations in order to help you during your recovery period.  Colace (docusate) - Pick up an over-the-counter form of Colace or another stool softener and take twice a day as long as you are requiring postoperative pain medications.  Take with a full glass of water daily.  If you experience loose stools or diarrhea, hold the colace until you stool forms back up. If your symptoms do not get better within 1 week or if they get worse, check with your doctor. Dulcolax (bisacodyl) - Pick up over-the-counter and take as directed by the product packaging as needed to assist with the movement of your bowels.  Take with a full glass of water.  Use this product as needed if not relieved by Colace only.  MiraLax (polyethylene glycol) - Pick up over-the-counter to have on hand. MiraLax is a solution that will increase the amount of water in your bowels to assist with bowel movements.  Take as directed and can mix with a glass of water, juice, soda, coffee, or tea. Take if you go more than two days without a movement. Do not use MiraLax more than once per day. Call your doctor if you are still constipated or irregular after using this medication for 7 days in a row.  If you continue  to have problems with postoperative constipation, please contact the office for further assistance and recommendations.  If you experience "the worst abdominal pain ever" or develop nausea or vomiting, please contact the office immediatly for further recommendations for treatment.  ITCHING If you experience itching with your medications, try taking only a single pain pill, or even half a pain pill at a time.  You can also use Benadryl over the counter for itching or also to help with sleep.   MEDICATIONS See your medication summary on the "After Visit Summary" that the nursing staff will review with you prior to discharge.  You may have some home medications which will be placed on hold until you complete the course of blood thinner medication.  It is important for you to complete the blood thinner medication as prescribed by your surgeon.  Continue your approved medications as instructed at time of discharge.  PRECAUTIONS If you experience chest pain or shortness of breath - call 911 immediately for transfer to the hospital emergency department.  If you develop a fever greater that   101 F, purulent drainage from wound, increased redness or drainage from wound, foul odor from the wound/dressing, or calf pain - CONTACT YOUR SURGEON.                                                   FOLLOW-UP APPOINTMENTS Make sure you keep all of your appointments after your operation with your surgeon and caregivers. You should call the office at the above phone number and make an appointment for approximately two weeks after the date of your surgery or on the date instructed by your surgeon outlined in the "After Visit Summary".  RANGE OF MOTION AND STRENGTHENING EXERCISES  Rehabilitation of the knee is important following a knee injury or an operation. After just a few days of immobilization, the muscles of the thigh which control the knee become weakened and shrink (atrophy). Knee exercises are designed to build up  the tone and strength of the thigh muscles and to improve knee motion. Often times heat used for twenty to thirty minutes before working out will loosen up your tissues and help with improving the range of motion but do not use heat for the first two weeks following surgery. These exercises can be done on a training (exercise) mat, on the floor, on a table or on a bed. Use what ever works the best and is most comfortable for you Knee exercises include:  Leg Lifts - While your knee is still immobilized in a splint or cast, you can do straight leg raises. Lift the leg to 60 degrees, hold for 3 sec, and slowly lower the leg. Repeat 10-20 times 2-3 times daily. Perform this exercise against resistance later as your knee gets better.  Quad and Hamstring Sets - Tighten up the muscle on the front of the thigh (Quad) and hold for 5-10 sec. Repeat this 10-20 times hourly. Hamstring sets are done by pushing the foot backward against an object and holding for 5-10 sec. Repeat as with quad sets.  Leg Slides: Lying on your back, slowly slide your foot toward your buttocks, bending your knee up off the floor (only go as far as is comfortable). Then slowly slide your foot back down until your leg is flat on the floor again. Angel Wings: Lying on your back spread your legs to the side as far apart as you can without causing discomfort.  A rehabilitation program following serious knee injuries can speed recovery and prevent re-injury in the future due to weakened muscles. Contact your doctor or a physical therapist for more information on knee rehabilitation.   POST-OPERATIVE OPIOID TAPER INSTRUCTIONS: It is important to wean off of your opioid medication as soon as possible. If you do not need pain medication after your surgery it is ok to stop day one. Opioids include: Codeine, Hydrocodone(Norco, Vicodin), Oxycodone(Percocet, oxycontin) and hydromorphone amongst others.  Long term and even short term use of opiods can  cause: Increased pain response Dependence Constipation Depression Respiratory depression And more.  Withdrawal symptoms can include Flu like symptoms Nausea, vomiting And more Techniques to manage these symptoms Hydrate well Eat regular healthy meals Stay active Use relaxation techniques(deep breathing, meditating, yoga) Do Not substitute Alcohol to help with tapering If you have been on opioids for less than two weeks and do not have pain than it is ok to stop all together.    Plan to wean off of opioids This plan should start within one week post op of your joint replacement. Maintain the same interval or time between taking each dose and first decrease the dose.  Cut the total daily intake of opioids by one tablet each day Next start to increase the time between doses. The last dose that should be eliminated is the evening dose.   IF YOU ARE TRANSFERRED TO A SKILLED REHAB FACILITY If the patient is transferred to a skilled rehab facility following release from the hospital, a list of the current medications will be sent to the facility for the patient to continue.  When discharged from the skilled rehab facility, please have the facility set up the patient's Home Health Physical Therapy prior to being released. Also, the skilled facility will be responsible for providing the patient with their medications at time of release from the facility to include their pain medication, the muscle relaxants, and their blood thinner medication. If the patient is still at the rehab facility at time of the two week follow up appointment, the skilled rehab facility will also need to assist the patient in arranging follow up appointment in our office and any transportation needs.  MAKE SURE YOU:  Understand these instructions.  Get help right away if you are not doing well or get worse.   DENTAL ANTIBIOTICS:  In most cases prophylactic antibiotics for Dental procdeures after total joint surgery are  not necessary.  Exceptions are as follows:  1. History of prior total joint infection  2. Severely immunocompromised (Organ Transplant, cancer chemotherapy, Rheumatoid biologic meds such as Humera)  3. Poorly controlled diabetes (A1C &gt; 8.0, blood glucose over 200)  If you have one of these conditions, contact your surgeon for an antibiotic prescription, prior to your dental procedure.    Pick up stool softner and laxative for home use following surgery while on pain medications. Do not submerge incision under water. Please use good hand washing techniques while changing dressing each day. May shower starting three days after surgery. Please use a clean towel to pat the incision dry following showers. Continue to use ice for pain and swelling after surgery. Do not use any lotions or creams on the incision until instructed by your surgeon.  

## 2022-06-26 NOTE — Progress Notes (Signed)
PHARMACIST - PHYSICIAN COMMUNICATION  DR:   Lequita Halt  CONCERNING: Simvastatin 40 mg daily and amlodipine - rhabdomyolysis risk  RECOMMENDATION/PLAN:  Patients on amlodipine and simvastatin >20mg /day have reported cases of rhabdomyolysis.  Simvastatin dose > 20 mg;  Substitute atorvastatin (Lipitor) 1 mg for each 2 mg simvastatin per protocol.    Thank you for allowing pharmacy to be a part of this patient's care.  Selinda Eon, PharmD, BCPS Clinical Pharmacist  Please utilize Amion for appropriate phone number to reach the unit pharmacist [facility]) 06/26/2022 4:43 PM

## 2022-06-26 NOTE — Anesthesia Postprocedure Evaluation (Signed)
Anesthesia Post Note  Patient: Joshua [name]  Procedure(s) Performed: TOTAL KNEE ARTHROPLASTY (Left: Knee)     Patient location during evaluation: PACU Anesthesia Type: General Level of consciousness: sedated and patient cooperative Pain management: pain level controlled Vital Signs Assessment: post-procedure vital signs reviewed and stable Respiratory status: spontaneous breathing Cardiovascular status: stable Anesthetic complications: no   No notable events documented.  Last Vitals:  Vitals:   06/26/22 1456 06/26/22 1644  BP: 121/68 107/63  Pulse: 69 (!) 54  Resp: 15 16  Temp:    SpO2: 100% 99%    Last Pain:  Vitals:   06/26/22 1456  TempSrc:   PainSc: 3                  Lewie Loron

## 2022-06-27 ENCOUNTER — Other Ambulatory Visit (HOSPITAL_COMMUNITY): Payer: Self-pay

## 2022-06-27 ENCOUNTER — Encounter (HOSPITAL_COMMUNITY): Payer: Self-pay | Admitting: Orthopedic Surgery

## 2022-06-27 DIAGNOSIS — M1712 Unilateral primary osteoarthritis, left knee: Secondary | ICD-10-CM | POA: Diagnosis not present

## 2022-06-27 DIAGNOSIS — Z79899 Other long term (current) drug therapy: Secondary | ICD-10-CM | POA: Diagnosis not present

## 2022-06-27 DIAGNOSIS — I10 Essential (primary) hypertension: Secondary | ICD-10-CM | POA: Diagnosis not present

## 2022-06-27 DIAGNOSIS — Z96661 Presence of right artificial ankle joint: Secondary | ICD-10-CM | POA: Diagnosis not present

## 2022-06-27 LAB — CBC
HCT: 39 % (ref 39.0–52.0)
Hemoglobin: 12.6 g/dL — ABNORMAL LOW (ref 13.0–17.0)
MCH: 28.9 pg (ref 26.0–34.0)
MCHC: 32.3 g/dL (ref 30.0–36.0)
MCV: 89.4 fL (ref 80.0–100.0)
Platelets: 177 10*3/uL (ref 150–400)
RBC: 4.36 MIL/uL (ref 4.22–5.81)
RDW: 12.3 % (ref 11.5–15.5)
WBC: 8.4 10*3/uL (ref 4.0–10.5)
nRBC: 0 % (ref 0.0–0.2)

## 2022-06-27 LAB — BASIC METABOLIC PANEL
Anion gap: 7 (ref 5–15)
BUN: 21 mg/dL (ref 8–23)
CO2: 26 mmol/L (ref 22–32)
Calcium: 8.6 mg/dL — ABNORMAL LOW (ref 8.9–10.3)
Chloride: 103 mmol/L (ref 98–111)
Creatinine, Ser: 0.83 mg/dL (ref 0.61–1.24)
GFR, Estimated: >60 mL/min (ref >60)
Glucose, Bld: 155 mg/dL — ABNORMAL HIGH (ref 70–99)
Potassium: 4.6 mmol/L (ref 3.5–5.1)
Sodium: 136 mmol/L (ref 135–145)

## 2022-06-27 MED ORDER — ONDANSETRON HCL 4 MG PO TABS
4.0000 mg | ORAL_TABLET | Freq: Four times a day (QID) | ORAL | 0 refills | Status: DC | PRN
  Filled 2022-06-27: qty 20, 5d supply, fill #0

## 2022-06-27 MED ORDER — TRAMADOL HCL 50 MG PO TABS
50.0000 mg | ORAL_TABLET | Freq: Four times a day (QID) | ORAL | 0 refills | Status: DC | PRN
  Filled 2022-06-27: qty 40, 5d supply, fill #0

## 2022-06-27 MED ORDER — METHOCARBAMOL 500 MG PO TABS
500.0000 mg | ORAL_TABLET | Freq: Four times a day (QID) | ORAL | 0 refills | Status: DC | PRN
  Filled 2022-06-27: qty 40, 10d supply, fill #0

## 2022-06-27 MED ORDER — OXYCODONE HCL 5 MG PO TABS
5.0000 mg | ORAL_TABLET | Freq: Four times a day (QID) | ORAL | 0 refills | Status: DC | PRN
  Filled 2022-06-27: qty 42, 6d supply, fill #0

## 2022-06-27 MED ORDER — ASPIRIN 81 MG PO CHEW
81.0000 mg | CHEWABLE_TABLET | Freq: Two times a day (BID) | ORAL | 0 refills | Status: AC
  Filled 2022-06-27 – 2022-09-07 (×2): qty 40, 20d supply, fill #0

## 2022-06-27 NOTE — Progress Notes (Signed)
Physical Therapy Treatment Patient Details Name: Joshua [name] MRN: [id number] DOB: 1950/02/19 Today's Date: 06/27/2022   History of Present Illness 72 yo male presents to therapy s/p L TKA on 5/20/204 due to failure of conservative measures. Pt PMH includes but is not limited to: mitral valve regurgitation, AAA, OSA with poor compliance with CPAP, HTN, cervical herniated nucleus pulposus s/p ACDF, and hx of lumbar sx 02/2022 and pars defect.    PT Comments    POD #1 pm session Spouse present during session.  Had her "hands on" assist pt with all mobility including stairs.  Then returned to room to perform some TE's following HEP handout.  Instructed on proper tech, freq as well as use of ICE.   Addressed all mobility questions, discussed appropriate activity, educated on use of ICE.  Pt ready for D/C to home.   Recommendations for follow up therapy are one component of a multi-disciplinary discharge planning process, led by the attending physician.  Recommendations may be updated based on patient status, additional functional criteria and insurance authorization.  Follow Up Recommendations       Assistance Recommended at Discharge Intermittent Supervision/Assistance  Patient can return home with the following A little help with walking and/or transfers;A little help with bathing/dressing/bathroom;Assistance with cooking/housework;Assist for transportation;Help with stairs or ramp for entrance   Equipment Recommendations  None recommended by PT    Recommendations for Other Services       Precautions / Restrictions Precautions Precautions: Knee;Fall Precaution Comments: instructed no pillow under knee Restrictions Weight Bearing Restrictions: No Other Position/Activity Restrictions: WBAT     Mobility  Bed Mobility               General bed mobility comments: OOB in recliner    Transfers Overall transfer level: Needs assistance Equipment used: Rolling walker (2  wheels) Transfers: Sit to/from Stand Sit to Stand: Supervision, Min guard           General transfer comment: cues for proper UE placement    Ambulation/Gait Ambulation/Gait assistance: Supervision, Min guard Gait Distance (Feet): 75 Feet Assistive device: Rolling walker (2 wheels) Gait Pattern/deviations: Step-to pattern, Antalgic Gait velocity: decreased     General Gait Details: No c/o dizziness this session.  Tolerated an increased distance.   Stairs Stairs: Yes Stairs assistance: Supervision, Min guard Stair Management: One rail Left, Step to pattern, Forwards, Sideways Number of Stairs: 4 General stair comments: with Spouse "hands on" VC's on proper tech/safety   Wheelchair Mobility    Modified Rankin (Stroke Patients Only)       Balance                                            Cognition Arousal/Alertness: Awake/alert Behavior During Therapy: WFL for tasks assessed/performed Overall Cognitive Status: Within Functional Limits for tasks assessed                                 General Comments: AxO x 3 motivated        Exercises   Total Knee Replacement TE's following HEP handout 10 reps B LE ankle pumps 05 reps towel squeezes 05 reps knee presses 05 reps heel slides  05 reps SAQ's 05 reps SLR's 05 reps ABD 05 reps knee bends 05 reps assisted knee bends 05 reps LAQ's  Educated on use of gait belt to assist with TE's Followed by ICE    General Comments        Pertinent Vitals/Pain Pain Assessment Pain Assessment: 0-10 Pain Score: 5  Pain Location: L knee Pain Descriptors / Indicators: Aching, Constant, Discomfort, Operative site guarding Pain Intervention(s): Monitored during session, Premedicated before session, Repositioned, Ice applied    Home Living                          Prior Function            PT Goals (current goals can now be found in the care plan section) Progress towards  PT goals: Progressing toward goals    Frequency    7X/week      PT Plan Current plan remains appropriate    Co-evaluation              AM-PAC PT "6 Clicks" Mobility   Outcome Measure  Help needed turning from your back to your side while in a flat bed without using bedrails?: A Little Help needed moving from lying on your back to sitting on the side of a flat bed without using bedrails?: A Little Help needed moving to and from a bed to a chair (including a wheelchair)?: A Little Help needed standing up from a chair using your arms (e.g., wheelchair or bedside chair)?: A Little Help needed to walk in hospital room?: A Little Help needed climbing 3-5 steps with a railing? : A Little 6 Click Score: 18    End of Session Equipment Utilized During Treatment: Gait belt Activity Tolerance: Patient tolerated treatment well Patient left: in chair;with call bell/phone within reach;with family/visitor present Nurse Communication: Mobility status PT Visit Diagnosis: Unsteadiness on feet (R26.81);Other abnormalities of gait and mobility (R26.89);Muscle weakness (generalized) (M62.81);Difficulty in walking, not elsewhere classified (R26.2);Pain Pain - Right/Left: Left Pain - part of body: Knee     Time: 7829-5621 PT Time Calculation (min) (ACUTE ONLY): 28 min  Charges:  $Gait Training: 8-22 mins $Therapeutic Exercise: 8-22 mins                     Felecia Shelling  PTA Acute  Rehabilitation Services Office M-F          [phone]

## 2022-06-27 NOTE — Progress Notes (Signed)
Physical Therapy Treatment Patient Details Name: Joshua [name] MRN: [id number] DOB: 02/10/1950 Today's Date: 06/27/2022   History of Present Illness 72 yo male presents to therapy s/p L TKA on 5/20/204 due to failure of conservative measures. Pt PMH includes but is not limited to: mitral valve regurgitation, AAA, OSA with poor compliance with CPAP, HTN, cervical herniated nucleus pulposus s/p ACDF, and hx of lumbar sx 02/2022 and pars defect.    PT Comments    POD # 1 am session Pt AxO x 3 OOB in recliner.  Assisted with amb in hallway.  Then returned to room to perform some TE's following HEP handout.  Instructed on proper tech, freq as well as use of ICE.   Will see pt agagin at @ 1:30 when Spouse arrives for [facility].     Recommendations for follow up therapy are one component of a multi-disciplinary discharge planning process, led by the attending physician.  Recommendations may be updated based on patient status, additional functional criteria and insurance authorization.  Follow Up Recommendations       Assistance Recommended at Discharge Intermittent Supervision/Assistance  Patient can return home with the following A little help with walking and/or transfers;A little help with bathing/dressing/bathroom;Assistance with cooking/housework;Assist for transportation;Help with stairs or ramp for entrance   Equipment Recommendations  None recommended by PT    Recommendations for Other Services       Precautions / Restrictions Precautions Precautions: Knee;Fall Precaution Comments: instructed no pillow under knee Restrictions Weight Bearing Restrictions: No Other Position/Activity Restrictions: WBAT     Mobility  Bed Mobility               General bed mobility comments: OOB in recliner    Transfers Overall transfer level: Needs assistance Equipment used: Rolling walker (2 wheels) Transfers: Sit to/from Stand Sit to Stand: Supervision, Min guard            General transfer comment: cues for proper UE placement    Ambulation/Gait Ambulation/Gait assistance: Supervision, Min guard Gait Distance (Feet): 55 Feet Assistive device: Rolling walker (2 wheels) Gait Pattern/deviations: Step-to pattern, Antalgic Gait velocity: decreased     General Gait Details: No c/o dizziness this session.  Tolerated an increased distance.   Stairs Stairs: Yes Stairs assistance: Min assist Stair Management: No rails, One rail Left Number of Stairs: 4 General stair comments: First, parcticed no rails using walker then second, practiced using ONE rail.   Wheelchair Mobility    Modified Rankin (Stroke Patients Only)       Balance                                            Cognition Arousal/Alertness: Awake/alert Behavior During Therapy: WFL for tasks assessed/performed Overall Cognitive Status: Within Functional Limits for tasks assessed                                 General Comments: AxO x 3 motivated        Exercises   Total Knee Replacement TE's following HEP handout 10 reps B LE ankle pumps 05 reps towel squeezes 05 reps knee presses 05 reps heel slides  05 reps SAQ's 05 reps SLR's 05 reps ABD Educated on use of gait belt to assist with TE's Followed by ICE    General Comments  Pertinent Vitals/Pain Pain Assessment Pain Assessment: 0-10 Pain Score: 5  Pain Location: L knee Pain Descriptors / Indicators: Aching, Constant, Discomfort, Operative site guarding Pain Intervention(s): Monitored during session, Repositioned, Premedicated before session, Ice applied    Home Living                          Prior Function            PT Goals (current goals can now be found in the care plan section) Progress towards PT goals: Progressing toward goals    Frequency    7X/week      PT Plan Current plan remains appropriate    Co-evaluation               AM-PAC PT "6 Clicks" Mobility   Outcome Measure  Help needed turning from your back to your side while in a flat bed without using bedrails?: A Little Help needed moving from lying on your back to sitting on the side of a flat bed without using bedrails?: A Little Help needed moving to and from a bed to a chair (including a wheelchair)?: A Little Help needed standing up from a chair using your arms (e.g., wheelchair or bedside chair)?: A Little Help needed to walk in hospital room?: A Little Help needed climbing 3-5 steps with a railing? : A Little 6 Click Score: 18    End of Session Equipment Utilized During Treatment: Gait belt Activity Tolerance: Patient tolerated treatment well Patient left: in chair;with call bell/phone within reach;with family/visitor present Nurse Communication: Mobility status PT Visit Diagnosis: Unsteadiness on feet (R26.81);Other abnormalities of gait and mobility (R26.89);Muscle weakness (generalized) (M62.81);Difficulty in walking, not elsewhere classified (R26.2);Pain Pain - Right/Left: Left Pain - part of body: Knee     Time: 1610-9604 PT Time Calculation (min) (ACUTE ONLY): 28 min  Charges:  $Gait Training: 8-22 mins $Therapeutic Exercise: 8-22 mins                    Felecia Shelling  PTA Acute  Rehabilitation Services Office M-F          [phone]

## 2022-06-27 NOTE — Progress Notes (Signed)
Subjective: 1 Day Post-Op Procedure(s) (LRB): TOTAL KNEE ARTHROPLASTY (Left) Patient seen in rounds by Dr. Lequita Halt. Patient is well, and has had no acute complaints or problems. Denies SOB or chest pain. Denies calf pain. Patient reports pain as moderate. Worked with physical therapy yesterday and ambulated 45'. Did endorse some dizziness at that time but feeling better today. We will continue physical therapy today.  Objective: Vital signs in last 24 hours: Temp:  [96.5 F (35.8 C)-98.1 F (36.7 C)] 97.6 F (36.4 C) (05/21 0540) Pulse Rate:  [53-69] 62 (05/21 0540) Resp:  [10-19] 16 (05/21 0540) BP: (102-150)/(58-90) 133/72 (05/21 0540) SpO2:  [97 %-100 %] 99 % (05/21 0540) Weight:  [65.8 kg] 65.8 kg (05/20 0946)  Intake/Output from previous day:  Intake/Output Summary (Last 24 hours) at 06/27/2022 0746 Last data filed at 06/27/2022 0600 Gross per 24 hour  Intake 3376.1 ml  Output 730 ml  Net 2646.1 ml     Intake/Output this shift: No intake/output data recorded.  Labs: Recent Labs    06/27/22 0337  HGB 12.6*   Recent Labs    06/27/22 0337  WBC 8.4  RBC 4.36  HCT 39.0  PLT 177   Recent Labs    06/27/22 0337  NA 136  K 4.6  CL 103  CO2 26  BUN 21  CREATININE 0.83  GLUCOSE 155*  CALCIUM 8.6*   No results for input(s): "LABPT", "INR" in the last 72 hours.  Exam: General - Patient is Alert and Oriented Extremity - Neurologically intact Neurovascular intact Sensation intact distally Dorsiflexion/Plantar flexion intact Dressing - dressing C/D/I Motor Function - intact, moving foot and toes well on exam.  Past Medical History:  Diagnosis Date   Anemia    as a child   Anxiety    Arthritis    Complication of anesthesia    Hard to wake up after colonoscopy   Depression    takes Wellbutrin daily   GERD (gastroesophageal reflux disease)    Hyperlipidemia    takes Simvastatin daily   Hypertension    takes Metoprolol and Hyzaar daily    Muscle  spasm    takes Robaxin as needed   Nerve pain    takes Gabapentin as needed   OSA (obstructive sleep apnea) 12/01/2013   16.6 AHI and RDI of over 30. REM AHI over 30.     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 and states it is very well controlled   Urinary frequency    Weakness    numbness and tingling in right arm down to fingers    Assessment/Plan: 1 Day Post-Op Procedure(s) (LRB): TOTAL KNEE ARTHROPLASTY (Left) Principal Problem:   OA (osteoarthritis) of knee Active Problems:   Primary osteoarthritis of left knee  Estimated body mass index is 24.13 kg/m as calculated from the following:   Height as of this encounter: 5\' 5"  (1.651 m).   Weight as of this encounter: 65.8 kg. Advance diet Up with therapy D/C IV fluids  Patient's anticipated LOS is less than 2 midnights, meeting these requirements: - Lives within 1 hour of care - Has a competent adult at home to recover with post-op - NO history of  - Chronic pain requiring opiods  - Diabetes  - Coronary Artery Disease  - Heart failure  - Heart attack  - Stroke  - DVT/VTE  - Cardiac arrhythmia  - Respiratory Failure/COPD  - Renal failure  - Anemia  - Advanced Liver disease  DVT Prophylaxis -  Aspirin Weight bearing as tolerated.  Continue physical therapy. Expected discharge home today pending progress and if meeting patient goals. Planning to do for OPPT at [facility]. Follow-up in clinic in 2 weeks.  The PDMP database was reviewed today prior to any opioid medications being prescribed to this patient.  R. Arcola Jansky, PA-C Orthopedic Surgery 06/27/2022, 7:46 AM

## 2022-06-27 NOTE — Discharge Summary (Signed)
Physician Discharge Summary   Patient ID: Joshua [name] MRN: [id number] DOB/AGE: Nov 07, 1950 72 y.o.  Admit date: 06/26/2022 Discharge date: 06/27/2022  Primary Diagnosis: Osteoarthritis left knee   Admission Diagnoses:  Past Medical History:  Diagnosis Date   Anemia    as a child   Anxiety    Arthritis    Complication of anesthesia    Hard to wake up after colonoscopy   Depression    takes Wellbutrin daily   GERD (gastroesophageal reflux disease)    Hyperlipidemia    takes Simvastatin daily   Hypertension    takes Metoprolol and Hyzaar daily    Muscle spasm    takes Robaxin as needed   Nerve pain    takes Gabapentin as needed   OSA (obstructive sleep apnea) 12/01/2013   16.6 AHI and RDI of over 30. REM AHI over 30.     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 and states it is very well controlled   Urinary frequency    Weakness    numbness and tingling in right arm down to fingers   Discharge Diagnoses:   Principal Problem:   OA (osteoarthritis) of knee Active Problems:   Primary osteoarthritis of left knee  Estimated body mass index is 24.13 kg/m as calculated from the following:   Height as of this encounter: 5\' 5"  (1.651 m).   Weight as of this encounter: 65.8 kg.  Procedure:  Procedure(s) (LRB): TOTAL KNEE ARTHROPLASTY (Left)   Consults: None  HPI: Joshua [name] is a 72 y.o. year old male with end stage OA of his left knee with progressively worsening pain and dysfunction. He has constant pain, with activity and at rest and significant functional deficits with difficulties even with ADLs. He has had extensive non-op management including analgesics, injections of cortisone and viscosupplements, and home exercise program, but remains in significant pain with significant dysfunction. Radiographs show bone on bone arthritis medial and patellofemoral. He presents now for left Total Knee Arthroplasty.  Laboratory Data: Admission on 06/26/2022, Discharged on  06/27/2022  Component Date Value Ref Range Status   WBC 06/27/2022 8.4  4.0 - 10.5 K/uL Final   RBC 06/27/2022 4.36  4.22 - 5.81 MIL/uL Final   Hemoglobin 06/27/2022 12.6 (L)  13.0 - 17.0 g/dL Final   HCT 40/98/1191 39.0  39.0 - 52.0 % Final   MCV 06/27/2022 89.4  80.0 - 100.0 fL Final   MCH 06/27/2022 28.9  26.0 - 34.0 pg Final   MCHC 06/27/2022 32.3  30.0 - 36.0 g/dL Final   RDW 47/82/9562 12.3  11.5 - 15.5 % Final   Platelets 06/27/2022 177  150 - 400 K/uL Final   nRBC 06/27/2022 0.0  0.0 - 0.2 % Final   Performed at [facility], [address] [place], [place] [address]   Sodium 06/27/2022 136  135 - 145 mmol/L Final   Potassium 06/27/2022 4.6  3.5 - 5.1 mmol/L Final   Chloride 06/27/2022 103  98 - 111 mmol/L Final   CO2 06/27/2022 26  22 - 32 mmol/L Final   Glucose, Bld 06/27/2022 155 (H)  70 - 99 mg/dL Final   Glucose reference range applies only to samples taken after fasting for at least 8 hours.   BUN 06/27/2022 21  8 - 23 mg/dL Final   Creatinine, Ser 06/27/2022 0.83  0.61 - 1.24 mg/dL Final   Calcium 57/84/6962 8.6 (L)  8.9 - 10.3 mg/dL Final   GFR, Estimated  06/27/2022 >60  >60 mL/min Final   Comment: (NOTE) Calculated using the CKD-EPI Creatinine Equation (2021)    Anion gap 06/27/2022 7  5 - 15 Final   Performed at [facility], [address], [place], [place] [address]  Hospital Outpatient Visit on 06/13/2022  Component Date Value Ref Range Status   MRSA, PCR 06/13/2022 NEGATIVE  NEGATIVE Final   Staphylococcus aureus 06/13/2022 POSITIVE (A)  NEGATIVE Final   Comment: (NOTE) The Xpert SA Assay (FDA approved for NASAL specimens in patients 68 years of age and older), is one component of a comprehensive surveillance program. It is not intended to diagnose infection nor to guide or monitor treatment. Performed at [facility], [address] [place], [place] [address]    Sodium 06/13/2022 137  135 - 145  mmol/L Final   Potassium 06/13/2022 4.2  3.5 - 5.1 mmol/L Final   Chloride 06/13/2022 103  98 - 111 mmol/L Final   CO2 06/13/2022 27  22 - 32 mmol/L Final   Glucose, Bld 06/13/2022 85  70 - 99 mg/dL Final   Glucose reference range applies only to samples taken after fasting for at least 8 hours.   BUN 06/13/2022 26 (H)  8 - 23 mg/dL Final   Creatinine, Ser 06/13/2022 0.90  0.61 - 1.24 mg/dL Final   Calcium 09/81/1914 9.2  8.9 - 10.3 mg/dL Final   GFR, Estimated 06/13/2022 >60  >60 mL/min Final   Comment: (NOTE) Calculated using the CKD-EPI Creatinine Equation (2021)    Anion gap 06/13/2022 7  5 - 15 Final   Performed at [facility], [address] [place], [place] [address]   WBC 06/13/2022 5.2  4.0 - 10.5 K/uL Final   RBC 06/13/2022 5.33  4.22 - 5.81 MIL/uL Final   Hemoglobin 06/13/2022 15.7  13.0 - 17.0 g/dL Final   HCT 62/13/0865 48.3  39.0 - 52.0 % Final   MCV 06/13/2022 90.6  80.0 - 100.0 fL Final   MCH 06/13/2022 29.5  26.0 - 34.0 pg Final   MCHC 06/13/2022 32.5  30.0 - 36.0 g/dL Final   RDW 78/46/9629 12.4  11.5 - 15.5 % Final   Platelets 06/13/2022 232  150 - 400 K/uL Final   nRBC 06/13/2022 0.0  0.0 - 0.2 % Final   Performed at [facility], [address] [place], [place] [address]     X-Rays:No results found.  EKG: Orders placed or performed in visit on 02/17/22   EKG 12-Lead     Hospital Course: Joshua [name] is a 72 y.o. who was admitted to [facility]. They were brought to the operating room on 06/26/2022 and underwent Procedure(s): TOTAL KNEE ARTHROPLASTY.  Patient tolerated the procedure well and was later transferred to the recovery room and then to the orthopaedic floor for postoperative care. They were given PO and IV analgesics for pain control following their surgery. They were given 24 hours of postoperative antibiotics of  Anti-infectives (From admission, onward)    Start     Dose/Rate Route Frequency  Ordered Stop   06/26/22 1800  ceFAZolin (ANCEF) IVPB 2g/100 mL premix        2 g 200 mL/hr over 30 Minutes Intravenous Every 6 hours 06/26/22 1508 06/27/22 0014   06/26/22 1508  famciclovir (FAMVIR) tablet 125 mg        125 mg Oral Daily PRN 06/26/22 1508     06/26/22 0930  ceFAZolin (ANCEF)  IVPB 2g/100 mL premix        2 g 200 mL/hr over 30 Minutes Intravenous On call to O.R. 06/26/22 0915 06/26/22 1236     and started on DVT prophylaxis in the form of Aspirin.   PT and OT were ordered for total joint protocol. Discharge planning consulted to help with postop disposition and equipment needs.  Patient had a fair night on the evening of surgery. They started to get up OOB with therapy on POD #0. Pt was seen during rounds and was ready to go home pending progress with therapy. He worked with therapy on POD #1 and was meeting his goals. Pt was discharged to home later that day in stable condition.  Diet: Regular diet Activity: WBAT Follow-up: in 2 weeks Disposition: Home Discharged Condition: stable   Discharge Instructions     Call MD / Call 911   Complete by: As directed    If you experience chest pain or shortness of breath, CALL 911 and be transported to the hospital emergency room.  If you develope a fever above 101 F, pus (white drainage) or increased drainage or redness at the wound, or calf pain, call your surgeon's office.   Change dressing   Complete by: As directed    You may remove the bulky bandage (ACE wrap and gauze) two days after surgery. You will have an adhesive waterproof bandage underneath. Leave this in place until your first follow-up appointment.   Constipation Prevention   Complete by: As directed    Drink plenty of fluids.  Prune juice may be helpful.  You may use a stool softener, such as Colace (over the counter) 100 mg twice a day.  Use MiraLax (over the counter) for constipation as needed.   Diet - low sodium heart healthy   Complete by: As directed    Do  not put a pillow under the knee. Place it under the heel.   Complete by: As directed    Driving restrictions   Complete by: As directed    No driving for two weeks   Post-operative opioid taper instructions:   Complete by: As directed    POST-OPERATIVE OPIOID TAPER INSTRUCTIONS: It is important to wean off of your opioid medication as soon as possible. If you do not need pain medication after your surgery it is ok to stop day one. Opioids include: Codeine, Hydrocodone(Norco, Vicodin), Oxycodone(Percocet, oxycontin) and hydromorphone amongst others.  Long term and even short term use of opiods can cause: Increased pain response Dependence Constipation Depression Respiratory depression And more.  Withdrawal symptoms can include Flu like symptoms Nausea, vomiting And more Techniques to manage these symptoms Hydrate well Eat regular healthy meals Stay active Use relaxation techniques(deep breathing, meditating, yoga) Do Not substitute Alcohol to help with tapering If you have been on opioids for less than two weeks and do not have pain than it is ok to stop all together.  Plan to wean off of opioids This plan should start within one week post op of your joint replacement. Maintain the same interval or time between taking each dose and first decrease the dose.  Cut the total daily intake of opioids by one tablet each day Next start to increase the time between doses. The last dose that should be eliminated is the evening dose.      TED hose   Complete by: As directed    Use stockings (TED hose) for three weeks on both leg(s).  You may remove  them at night for sleeping.   Weight bearing as tolerated   Complete by: As directed       Allergies as of 06/27/2022   No Known Allergies      Medication List     STOP taking these medications    naproxen 500 MG EC tablet Commonly known as: EC NAPROSYN       TAKE these medications    amLODipine 5 MG tablet Commonly known  as: NORVASC Take 1 tablet (5 mg total) by mouth daily.   ascorbic acid 1000 MG tablet Commonly known as: VITAMIN C Take 1 tablet by mouth 2 (two) times a week.   aspirin 81 MG chewable tablet Chew 1 tablet (81 mg total) by mouth 2 (two) times daily for 20 days. Then take one 81 mg aspirin once a day for three weeks. Then discontinue aspirin.   B-D 3CC LUER-LOK SYR 18GX1-1/2 18G X 1-1/2" 3 ML Misc Generic drug: SYRINGE-NEEDLE (DISP) 3 ML Use as directed with Testosterone   buPROPion 150 MG 12 hr tablet Commonly known as: WELLBUTRIN SR Take 1 tablet (150 mg total) by mouth 2 (two) times daily.   CALCIUM 600-D PO Take 1 tablet by mouth daily.   ezetimibe 10 MG tablet Commonly known as: ZETIA Take 1 tablet (10 mg total) by mouth daily.   famciclovir 125 MG tablet Commonly known as: FAMVIR Take 1 tablet by mouth 2 times daily. What changed:  when to take this reasons to take this   famotidine 20 MG tablet Commonly known as: PEPCID TAKE 1 TABLET BY MOUTH ONCE DAILY AS NEEDED FOR HEART BURN   gabapentin 300 MG capsule Commonly known as: NEURONTIN Take 1 capsule (300 mg total) by mouth daily as needed for back pain   losartan 50 MG tablet Commonly known as: COZAAR Take 1 tablet (50 mg total) by mouth daily.   losartan 50 MG tablet Commonly known as: COZAAR Take 1 tablet (50 mg total) by mouth daily.   losartan 50 MG tablet Commonly known as: COZAAR Take 1 tablet (50 mg total) by mouth daily.   methocarbamol 500 MG tablet Commonly known as: ROBAXIN Take 1 tablet (500 mg total) by mouth every 6 (six) hours as needed for muscle spasms.   methylphenidate 20 MG ER tablet Commonly known as: METADATE ER Take 1 tablet (20 mg total) by mouth daily as needed   metoprolol succinate 25 MG 24 hr tablet Commonly known as: TOPROL-XL Take 1 tablet (25 mg total) by mouth daily.   MULTIVITAMINS PO Take 1 tablet by mouth daily.   ondansetron 4 MG tablet Commonly known as:  ZOFRAN Take 1 tablet (4 mg total) by mouth every 6 (six) hours as needed for nausea.   oxyCODONE 5 MG immediate release tablet Commonly known as: Oxy IR/ROXICODONE Take 1-2 tablets (5-10 mg total) by mouth every 6 (six) hours as needed for severe pain.   simvastatin 40 MG tablet Commonly known as: ZOCOR Take 1 tablet (40 mg total) by mouth daily.   tamsulosin 0.4 MG Caps capsule Commonly known as: FLOMAX Take 1 capsule (0.4 mg total) by mouth daily.   testosterone cypionate 200 MG/ML injection Commonly known as: DEPOTESTOSTERONE CYPIONATE Inject 1 mL (200 mg total) into the muscle every 21 ( twenty-one) days. What changed:  when to take this additional instructions   traMADol 50 MG tablet Commonly known as: ULTRAM Take 1-2 tablets (50-100 mg total) by mouth every 6 (six) hours as needed for moderate pain.  Vitamin E 268 MG (400 UNIT) Caps Take 400 Units by mouth once a week.               Discharge Care Instructions  (From admission, onward)           Start     Ordered   06/27/22 0000  Weight bearing as tolerated        06/27/22 0750   06/27/22 0000  Change dressing       Comments: You may remove the bulky bandage (ACE wrap and gauze) two days after surgery. You will have an adhesive waterproof bandage underneath. Leave this in place until your first follow-up appointment.   06/27/22 0750            Follow-up Information     Ollen Gross, MD Follow up in 2 week(s).   Specialty: Orthopedic Surgery Contact information: [address] [place] [address] [place] [address] [phone]                 Signed: R. Arcola Jansky, PA-C Orthopedic Surgery 06/27/2022, 3:35 PM

## 2022-06-27 NOTE — TOC Transition Note (Signed)
Transition of Care [facility]) - CM/SW Discharge Note   Patient Details  Name: Joshua [name] MRN: [id number] Date of Birth: 1950-04-10  [facility] CM/SW Contact:  Amada Jupiter, LCSW Phone Number: 06/27/2022, 9:30 AM   Clinical Narrative:     Met with pt who confirms he has needed DME at home.  OPPT already arranged with [facility].  No TOC needs.  Final next level of care: OP Rehab Barriers to Discharge: No Barriers Identified   Patient Goals and CMS Choice      Discharge Placement                         Discharge Plan and Services Additional resources added to the After Visit Summary for                  DME Arranged: N/A DME Agency: NA                  Social Determinants of Health (SDOH) Interventions SDOH Screenings   Food Insecurity: No Food Insecurity (06/26/2022)  Housing: Low Risk  (06/26/2022)  Transportation Needs: No Transportation Needs (06/26/2022)  Utilities: Not At Risk (06/26/2022)  Tobacco Use: Low Risk  (06/26/2022)     Readmission Risk Interventions     No data to display

## 2022-06-28 DIAGNOSIS — M25662 Stiffness of left knee, not elsewhere classified: Secondary | ICD-10-CM | POA: Diagnosis not present

## 2022-06-28 DIAGNOSIS — M25562 Pain in left knee: Secondary | ICD-10-CM | POA: Diagnosis not present

## 2022-06-29 ENCOUNTER — Ambulatory Visit (HOSPITAL_BASED_OUTPATIENT_CLINIC_OR_DEPARTMENT_OTHER): Payer: Medicare Other | Attending: Student | Admitting: Physical Therapy

## 2022-06-29 ENCOUNTER — Other Ambulatory Visit (HOSPITAL_COMMUNITY): Payer: Self-pay

## 2022-06-29 MED ORDER — TAMSULOSIN HCL 0.4 MG PO CAPS: 0.4000 mg | ORAL_CAPSULE | Freq: Every day | ORAL | 3 refills | Status: AC

## 2022-06-30 ENCOUNTER — Other Ambulatory Visit (HOSPITAL_COMMUNITY): Payer: Self-pay

## 2022-06-30 DIAGNOSIS — M25562 Pain in left knee: Secondary | ICD-10-CM | POA: Diagnosis not present

## 2022-06-30 DIAGNOSIS — M25662 Stiffness of left knee, not elsewhere classified: Secondary | ICD-10-CM | POA: Diagnosis not present

## 2022-07-04 ENCOUNTER — Encounter (HOSPITAL_BASED_OUTPATIENT_CLINIC_OR_DEPARTMENT_OTHER): Payer: PRIVATE HEALTH INSURANCE | Admitting: Physical Therapy

## 2022-07-04 DIAGNOSIS — M25662 Stiffness of left knee, not elsewhere classified: Secondary | ICD-10-CM | POA: Diagnosis not present

## 2022-07-04 DIAGNOSIS — M25562 Pain in left knee: Secondary | ICD-10-CM | POA: Diagnosis not present

## 2022-07-05 ENCOUNTER — Other Ambulatory Visit (HOSPITAL_BASED_OUTPATIENT_CLINIC_OR_DEPARTMENT_OTHER): Payer: Self-pay

## 2022-07-05 MED ORDER — SIMVASTATIN 40 MG PO TABS
40.0000 mg | ORAL_TABLET | Freq: Every evening | ORAL | 3 refills | Status: AC
  Filled 2022-07-05: qty 90, 90d supply, fill #0
  Filled 2022-09-27 – 2023-03-05 (×4): qty 90, 90d supply, fill #1
  Filled 2023-06-04: qty 90, 90d supply, fill #2

## 2022-07-06 ENCOUNTER — Other Ambulatory Visit: Payer: Self-pay

## 2022-07-10 DIAGNOSIS — M25562 Pain in left knee: Secondary | ICD-10-CM | POA: Diagnosis not present

## 2022-07-10 DIAGNOSIS — M25662 Stiffness of left knee, not elsewhere classified: Secondary | ICD-10-CM | POA: Diagnosis not present

## 2022-07-11 ENCOUNTER — Encounter (HOSPITAL_BASED_OUTPATIENT_CLINIC_OR_DEPARTMENT_OTHER): Payer: PRIVATE HEALTH INSURANCE | Admitting: Physical Therapy

## 2022-07-12 ENCOUNTER — Other Ambulatory Visit (HOSPITAL_COMMUNITY): Payer: Self-pay

## 2022-07-12 DIAGNOSIS — M25662 Stiffness of left knee, not elsewhere classified: Secondary | ICD-10-CM | POA: Diagnosis not present

## 2022-07-12 DIAGNOSIS — M25562 Pain in left knee: Secondary | ICD-10-CM | POA: Diagnosis not present

## 2022-07-12 MED ORDER — OXYCODONE HCL 5 MG PO TABS
5.0000 mg | ORAL_TABLET | Freq: Four times a day (QID) | ORAL | 0 refills | Status: AC | PRN
  Filled 2022-07-12: qty 30, 8d supply, fill #0

## 2022-07-12 MED ORDER — ONDANSETRON HCL 4 MG PO TABS
4.0000 mg | ORAL_TABLET | Freq: Four times a day (QID) | ORAL | 1 refills | Status: AC | PRN
  Filled 2022-07-12: qty 20, 5d supply, fill #0

## 2022-07-12 MED ORDER — METHOCARBAMOL 500 MG PO TABS
500.0000 mg | ORAL_TABLET | Freq: Three times a day (TID) | ORAL | 0 refills | Status: AC | PRN
  Filled 2022-07-12: qty 30, 10d supply, fill #0

## 2022-07-12 MED ORDER — FAMOTIDINE 20 MG PO TABS
20.0000 mg | ORAL_TABLET | Freq: Every day | ORAL | 4 refills | Status: DC | PRN
  Filled 2022-07-12: qty 90, 90d supply, fill #0
  Filled 2023-05-22 (×3): qty 90, 90d supply, fill #1

## 2022-07-13 ENCOUNTER — Other Ambulatory Visit: Payer: Self-pay

## 2022-07-13 ENCOUNTER — Other Ambulatory Visit (HOSPITAL_COMMUNITY): Payer: Self-pay

## 2022-07-13 MED ORDER — METHYLPHENIDATE HCL ER 20 MG PO TBCR
20.0000 mg | EXTENDED_RELEASE_TABLET | Freq: Every day | ORAL | 0 refills | Status: DC
  Filled 2022-07-13: qty 30, 30d supply, fill #0

## 2022-07-17 DIAGNOSIS — M25662 Stiffness of left knee, not elsewhere classified: Secondary | ICD-10-CM | POA: Diagnosis not present

## 2022-07-17 DIAGNOSIS — M25562 Pain in left knee: Secondary | ICD-10-CM | POA: Diagnosis not present

## 2022-07-19 ENCOUNTER — Encounter (HOSPITAL_BASED_OUTPATIENT_CLINIC_OR_DEPARTMENT_OTHER): Payer: PRIVATE HEALTH INSURANCE | Admitting: Physical Therapy

## 2022-07-19 DIAGNOSIS — M25662 Stiffness of left knee, not elsewhere classified: Secondary | ICD-10-CM | POA: Diagnosis not present

## 2022-07-19 DIAGNOSIS — M25562 Pain in left knee: Secondary | ICD-10-CM | POA: Diagnosis not present

## 2022-07-21 ENCOUNTER — Other Ambulatory Visit (HOSPITAL_COMMUNITY): Payer: Self-pay

## 2022-07-21 ENCOUNTER — Other Ambulatory Visit: Payer: Self-pay

## 2022-07-21 MED ORDER — AMOXICILLIN 500 MG PO CAPS
2000.0000 mg | ORAL_CAPSULE | ORAL | 0 refills | Status: AC
  Filled 2022-07-21: qty 8, 2d supply, fill #0

## 2022-07-25 ENCOUNTER — Encounter (HOSPITAL_BASED_OUTPATIENT_CLINIC_OR_DEPARTMENT_OTHER): Payer: PRIVATE HEALTH INSURANCE | Admitting: Physical Therapy

## 2022-07-25 DIAGNOSIS — M25662 Stiffness of left knee, not elsewhere classified: Secondary | ICD-10-CM | POA: Diagnosis not present

## 2022-07-25 DIAGNOSIS — M25562 Pain in left knee: Secondary | ICD-10-CM | POA: Diagnosis not present

## 2022-07-28 ENCOUNTER — Encounter (HOSPITAL_BASED_OUTPATIENT_CLINIC_OR_DEPARTMENT_OTHER): Payer: PRIVATE HEALTH INSURANCE | Admitting: Physical Therapy

## 2022-08-01 ENCOUNTER — Encounter (HOSPITAL_BASED_OUTPATIENT_CLINIC_OR_DEPARTMENT_OTHER): Payer: PRIVATE HEALTH INSURANCE | Admitting: Physical Therapy

## 2022-08-04 ENCOUNTER — Encounter (HOSPITAL_BASED_OUTPATIENT_CLINIC_OR_DEPARTMENT_OTHER): Payer: PRIVATE HEALTH INSURANCE | Admitting: Physical Therapy

## 2022-08-07 ENCOUNTER — Other Ambulatory Visit: Payer: Self-pay

## 2022-08-07 ENCOUNTER — Other Ambulatory Visit (HOSPITAL_COMMUNITY): Payer: Self-pay

## 2022-08-07 DIAGNOSIS — M25562 Pain in left knee: Secondary | ICD-10-CM | POA: Diagnosis not present

## 2022-08-07 DIAGNOSIS — M25662 Stiffness of left knee, not elsewhere classified: Secondary | ICD-10-CM | POA: Diagnosis not present

## 2022-08-07 MED ORDER — EZETIMIBE 10 MG PO TABS
10.0000 mg | ORAL_TABLET | Freq: Every day | ORAL | 1 refills | Status: AC
  Filled 2022-08-07: qty 90, 90d supply, fill #0
  Filled 2023-01-27: qty 90, 90d supply, fill #1

## 2022-08-07 MED ORDER — AMLODIPINE BESYLATE 5 MG PO TABS
5.0000 mg | ORAL_TABLET | Freq: Every day | ORAL | 3 refills | Status: AC
  Filled 2022-08-07: qty 90, 90d supply, fill #0
  Filled 2023-04-27: qty 90, 90d supply, fill #1
  Filled 2023-07-23: qty 90, 90d supply, fill #2

## 2022-08-08 DIAGNOSIS — Z5189 Encounter for other specified aftercare: Secondary | ICD-10-CM | POA: Diagnosis not present

## 2022-08-11 ENCOUNTER — Other Ambulatory Visit: Payer: Self-pay

## 2022-08-11 ENCOUNTER — Other Ambulatory Visit (HOSPITAL_COMMUNITY): Payer: Self-pay

## 2022-08-11 MED ORDER — METHYLPHENIDATE HCL ER 20 MG PO TBCR
20.0000 mg | EXTENDED_RELEASE_TABLET | Freq: Every day | ORAL | 0 refills | Status: AC
  Filled 2022-08-11: qty 30, 30d supply, fill #0

## 2022-08-14 DIAGNOSIS — M25662 Stiffness of left knee, not elsewhere classified: Secondary | ICD-10-CM | POA: Diagnosis not present

## 2022-08-14 DIAGNOSIS — M25562 Pain in left knee: Secondary | ICD-10-CM | POA: Diagnosis not present

## 2022-08-17 ENCOUNTER — Other Ambulatory Visit: Payer: Self-pay

## 2022-08-17 ENCOUNTER — Other Ambulatory Visit (HOSPITAL_COMMUNITY): Payer: Self-pay

## 2022-08-17 MED ORDER — BUPROPION HCL ER (SR) 150 MG PO TB12
150.0000 mg | ORAL_TABLET | Freq: Two times a day (BID) | ORAL | 3 refills | Status: AC
  Filled 2022-08-17: qty 180, 90d supply, fill #0
  Filled 2023-02-07: qty 180, 90d supply, fill #1
  Filled 2023-05-10: qty 180, 90d supply, fill #2
  Filled 2023-08-08: qty 180, 90d supply, fill #3

## 2022-08-18 DIAGNOSIS — M25662 Stiffness of left knee, not elsewhere classified: Secondary | ICD-10-CM | POA: Diagnosis not present

## 2022-08-18 DIAGNOSIS — M25562 Pain in left knee: Secondary | ICD-10-CM | POA: Diagnosis not present

## 2022-08-21 ENCOUNTER — Other Ambulatory Visit (HOSPITAL_COMMUNITY): Payer: Self-pay

## 2022-08-21 DIAGNOSIS — M25662 Stiffness of left knee, not elsewhere classified: Secondary | ICD-10-CM | POA: Diagnosis not present

## 2022-08-21 DIAGNOSIS — M25562 Pain in left knee: Secondary | ICD-10-CM | POA: Diagnosis not present

## 2022-08-23 DIAGNOSIS — M25562 Pain in left knee: Secondary | ICD-10-CM | POA: Diagnosis not present

## 2022-08-23 DIAGNOSIS — M25662 Stiffness of left knee, not elsewhere classified: Secondary | ICD-10-CM | POA: Diagnosis not present

## 2022-08-28 ENCOUNTER — Other Ambulatory Visit (HOSPITAL_COMMUNITY): Payer: Self-pay

## 2022-08-28 DIAGNOSIS — M25562 Pain in left knee: Secondary | ICD-10-CM | POA: Diagnosis not present

## 2022-08-28 DIAGNOSIS — M25662 Stiffness of left knee, not elsewhere classified: Secondary | ICD-10-CM | POA: Diagnosis not present

## 2022-08-28 MED ORDER — METOPROLOL SUCCINATE ER 25 MG PO TB24
25.0000 mg | ORAL_TABLET | Freq: Every day | ORAL | 3 refills | Status: AC
  Filled 2022-08-28 (×2): qty 90, 90d supply, fill #0

## 2022-08-30 DIAGNOSIS — M25662 Stiffness of left knee, not elsewhere classified: Secondary | ICD-10-CM | POA: Diagnosis not present

## 2022-08-30 DIAGNOSIS — M25562 Pain in left knee: Secondary | ICD-10-CM | POA: Diagnosis not present

## 2022-08-31 ENCOUNTER — Other Ambulatory Visit (HOSPITAL_COMMUNITY): Payer: Self-pay

## 2022-08-31 DIAGNOSIS — M4326 Fusion of spine, lumbar region: Secondary | ICD-10-CM | POA: Diagnosis not present

## 2022-08-31 MED ORDER — TESTOSTERONE CYPIONATE 200 MG/ML IM SOLN
200.0000 mg | INTRAMUSCULAR | 0 refills | Status: DC
  Filled 2022-08-31 – 2022-09-01 (×2): qty 4, 84d supply, fill #0

## 2022-09-01 ENCOUNTER — Other Ambulatory Visit: Payer: Self-pay

## 2022-09-01 ENCOUNTER — Other Ambulatory Visit (HOSPITAL_COMMUNITY): Payer: Self-pay

## 2022-09-04 DIAGNOSIS — M25562 Pain in left knee: Secondary | ICD-10-CM | POA: Diagnosis not present

## 2022-09-04 DIAGNOSIS — M25662 Stiffness of left knee, not elsewhere classified: Secondary | ICD-10-CM | POA: Diagnosis not present

## 2022-09-06 ENCOUNTER — Other Ambulatory Visit (HOSPITAL_COMMUNITY): Payer: Self-pay

## 2022-09-06 DIAGNOSIS — M25562 Pain in left knee: Secondary | ICD-10-CM | POA: Diagnosis not present

## 2022-09-06 DIAGNOSIS — M25662 Stiffness of left knee, not elsewhere classified: Secondary | ICD-10-CM | POA: Diagnosis not present

## 2022-09-07 ENCOUNTER — Other Ambulatory Visit (HOSPITAL_COMMUNITY): Payer: Self-pay

## 2022-09-07 ENCOUNTER — Other Ambulatory Visit: Payer: Self-pay

## 2022-09-07 MED ORDER — TAMSULOSIN HCL 0.4 MG PO CAPS
0.4000 mg | ORAL_CAPSULE | Freq: Every day | ORAL | 3 refills | Status: AC
  Filled 2022-09-07 – 2023-08-21 (×3): qty 90, 90d supply, fill #0

## 2022-09-08 ENCOUNTER — Other Ambulatory Visit (HOSPITAL_COMMUNITY): Payer: Self-pay

## 2022-09-08 DIAGNOSIS — E785 Hyperlipidemia, unspecified: Secondary | ICD-10-CM | POA: Diagnosis not present

## 2022-09-08 DIAGNOSIS — G4733 Obstructive sleep apnea (adult) (pediatric): Secondary | ICD-10-CM | POA: Diagnosis not present

## 2022-09-08 DIAGNOSIS — I10 Essential (primary) hypertension: Secondary | ICD-10-CM | POA: Diagnosis not present

## 2022-09-08 MED ORDER — METHYLPHENIDATE HCL ER 20 MG PO TBCR
20.0000 mg | EXTENDED_RELEASE_TABLET | Freq: Every day | ORAL | 0 refills | Status: AC | PRN
  Filled 2022-09-08: qty 30, 30d supply, fill #0

## 2022-09-11 ENCOUNTER — Other Ambulatory Visit: Payer: Self-pay

## 2022-09-11 DIAGNOSIS — M25662 Stiffness of left knee, not elsewhere classified: Secondary | ICD-10-CM | POA: Diagnosis not present

## 2022-09-11 DIAGNOSIS — M25562 Pain in left knee: Secondary | ICD-10-CM | POA: Diagnosis not present

## 2022-09-19 ENCOUNTER — Telehealth: Payer: Self-pay | Admitting: *Deleted

## 2022-09-19 NOTE — Telephone Encounter (Signed)
Not able to reach pt or his answer machine.

## 2022-09-20 ENCOUNTER — Other Ambulatory Visit (HOSPITAL_COMMUNITY): Payer: Self-pay

## 2022-09-20 DIAGNOSIS — G4733 Obstructive sleep apnea (adult) (pediatric): Secondary | ICD-10-CM | POA: Diagnosis not present

## 2022-09-27 ENCOUNTER — Other Ambulatory Visit: Payer: Self-pay

## 2022-10-04 ENCOUNTER — Other Ambulatory Visit (HOSPITAL_COMMUNITY): Payer: Self-pay

## 2022-10-05 ENCOUNTER — Other Ambulatory Visit: Payer: Self-pay

## 2022-10-05 ENCOUNTER — Other Ambulatory Visit (HOSPITAL_COMMUNITY): Payer: Self-pay

## 2022-10-05 MED ORDER — GABAPENTIN 300 MG PO CAPS
300.0000 mg | ORAL_CAPSULE | Freq: Every day | ORAL | 3 refills | Status: AC | PRN
  Filled 2022-10-05: qty 90, 90d supply, fill #0

## 2022-10-05 MED ORDER — METHYLPHENIDATE HCL ER 20 MG PO TBCR
20.0000 mg | EXTENDED_RELEASE_TABLET | Freq: Every day | ORAL | 0 refills | Status: AC
  Filled 2022-10-09: qty 30, 30d supply, fill #0

## 2022-10-09 ENCOUNTER — Other Ambulatory Visit (HOSPITAL_COMMUNITY): Payer: Self-pay

## 2022-10-10 ENCOUNTER — Other Ambulatory Visit: Payer: Self-pay

## 2022-10-20 ENCOUNTER — Other Ambulatory Visit (HOSPITAL_COMMUNITY): Payer: Self-pay

## 2022-10-20 DIAGNOSIS — N401 Enlarged prostate with lower urinary tract symptoms: Secondary | ICD-10-CM | POA: Diagnosis not present

## 2022-10-20 DIAGNOSIS — R350 Frequency of micturition: Secondary | ICD-10-CM | POA: Diagnosis not present

## 2022-10-20 DIAGNOSIS — R3912 Poor urinary stream: Secondary | ICD-10-CM | POA: Diagnosis not present

## 2022-10-20 MED ORDER — TAMSULOSIN HCL 0.4 MG PO CAPS
0.4000 mg | ORAL_CAPSULE | Freq: Two times a day (BID) | ORAL | 3 refills | Status: DC
  Filled 2022-10-20: qty 180, 90d supply, fill #0
  Filled 2023-01-15: qty 180, 90d supply, fill #1
  Filled 2023-04-14: qty 180, 90d supply, fill #2

## 2022-10-28 ENCOUNTER — Other Ambulatory Visit (HOSPITAL_COMMUNITY): Payer: Self-pay

## 2022-10-30 ENCOUNTER — Other Ambulatory Visit (HOSPITAL_COMMUNITY): Payer: Self-pay

## 2022-10-30 ENCOUNTER — Other Ambulatory Visit: Payer: Self-pay

## 2022-10-31 ENCOUNTER — Other Ambulatory Visit (HOSPITAL_COMMUNITY): Payer: Self-pay

## 2022-10-31 MED ORDER — METOPROLOL SUCCINATE ER 25 MG PO TB24
25.0000 mg | ORAL_TABLET | Freq: Every day | ORAL | 3 refills | Status: DC
  Filled 2023-01-12 – 2023-05-23 (×2): qty 90, 90d supply, fill #0
  Filled 2023-08-21: qty 90, 90d supply, fill #1
  Filled 2023-10-30: qty 90, 90d supply, fill #2

## 2022-10-31 MED ORDER — AMLODIPINE BESYLATE 5 MG PO TABS
5.0000 mg | ORAL_TABLET | Freq: Every day | ORAL | 3 refills | Status: DC
  Filled 2022-10-31 – 2023-10-20 (×3): qty 90, 90d supply, fill #0

## 2022-10-31 MED ORDER — EZETIMIBE 10 MG PO TABS
10.0000 mg | ORAL_TABLET | Freq: Every day | ORAL | 3 refills | Status: DC
  Filled 2022-10-31 – 2023-04-27 (×2): qty 90, 90d supply, fill #0
  Filled 2023-07-23: qty 90, 90d supply, fill #1
  Filled 2023-08-21 – 2023-10-20 (×2): qty 90, 90d supply, fill #0

## 2022-11-02 ENCOUNTER — Other Ambulatory Visit (HOSPITAL_COMMUNITY): Payer: Self-pay

## 2022-11-02 MED ORDER — METHYLPHENIDATE HCL ER 20 MG PO TBCR
20.0000 mg | EXTENDED_RELEASE_TABLET | Freq: Every day | ORAL | 0 refills | Status: AC | PRN
  Filled 2023-02-13: qty 30, 30d supply, fill #0

## 2022-11-03 ENCOUNTER — Other Ambulatory Visit (HOSPITAL_COMMUNITY): Payer: Self-pay

## 2022-11-09 ENCOUNTER — Other Ambulatory Visit: Payer: Self-pay

## 2022-11-13 ENCOUNTER — Other Ambulatory Visit (HOSPITAL_COMMUNITY): Payer: Self-pay

## 2022-11-20 ENCOUNTER — Other Ambulatory Visit: Payer: Self-pay

## 2022-11-22 ENCOUNTER — Other Ambulatory Visit (HOSPITAL_COMMUNITY): Payer: Self-pay

## 2022-11-22 MED ORDER — METHYLPHENIDATE HCL ER 20 MG PO TBCR
20.0000 mg | EXTENDED_RELEASE_TABLET | Freq: Every day | ORAL | 0 refills | Status: AC
  Filled 2022-11-22 (×2): qty 30, 30d supply, fill #0

## 2022-11-22 MED ORDER — TESTOSTERONE CYPIONATE 200 MG/ML IM SOLN
200.0000 mg | INTRAMUSCULAR | 0 refills | Status: DC
  Filled 2023-01-12: qty 4, 84d supply, fill #0
  Filled 2023-02-13: qty 1, 21d supply, fill #0
  Filled 2023-03-02: qty 4, 84d supply, fill #0

## 2022-11-23 ENCOUNTER — Other Ambulatory Visit (HOSPITAL_COMMUNITY): Payer: Self-pay

## 2022-11-30 ENCOUNTER — Other Ambulatory Visit: Payer: Self-pay

## 2022-12-11 ENCOUNTER — Other Ambulatory Visit: Payer: Self-pay

## 2022-12-11 ENCOUNTER — Other Ambulatory Visit (HOSPITAL_COMMUNITY): Payer: Self-pay

## 2022-12-11 MED ORDER — SIMVASTATIN 40 MG PO TABS
40.0000 mg | ORAL_TABLET | Freq: Every evening | ORAL | 3 refills | Status: DC
  Filled 2022-12-11 – 2023-09-01 (×3): qty 90, 90d supply, fill #0
  Filled 2023-11-25: qty 90, 90d supply, fill #1

## 2022-12-15 ENCOUNTER — Other Ambulatory Visit (HOSPITAL_COMMUNITY): Payer: Self-pay

## 2022-12-18 ENCOUNTER — Other Ambulatory Visit: Payer: Self-pay

## 2022-12-18 ENCOUNTER — Other Ambulatory Visit (HOSPITAL_COMMUNITY): Payer: Self-pay

## 2022-12-18 MED ORDER — METHYLPHENIDATE HCL ER 20 MG PO TBCR
20.0000 mg | EXTENDED_RELEASE_TABLET | Freq: Every day | ORAL | 0 refills | Status: AC
  Filled 2022-12-18 – 2022-12-20 (×2): qty 30, 30d supply, fill #0

## 2022-12-20 ENCOUNTER — Other Ambulatory Visit (HOSPITAL_COMMUNITY): Payer: Self-pay

## 2022-12-20 ENCOUNTER — Other Ambulatory Visit: Payer: Self-pay

## 2022-12-26 ENCOUNTER — Other Ambulatory Visit (HOSPITAL_COMMUNITY): Payer: Self-pay

## 2022-12-27 ENCOUNTER — Other Ambulatory Visit (HOSPITAL_COMMUNITY): Payer: Self-pay

## 2022-12-28 ENCOUNTER — Other Ambulatory Visit (HOSPITAL_COMMUNITY): Payer: Self-pay

## 2023-01-02 ENCOUNTER — Other Ambulatory Visit (HOSPITAL_COMMUNITY): Payer: Self-pay

## 2023-01-02 DIAGNOSIS — Z125 Encounter for screening for malignant neoplasm of prostate: Secondary | ICD-10-CM | POA: Diagnosis not present

## 2023-01-02 DIAGNOSIS — E291 Testicular hypofunction: Secondary | ICD-10-CM | POA: Diagnosis not present

## 2023-01-02 DIAGNOSIS — F900 Attention-deficit hyperactivity disorder, predominantly inattentive type: Secondary | ICD-10-CM | POA: Diagnosis not present

## 2023-01-02 DIAGNOSIS — R7989 Other specified abnormal findings of blood chemistry: Secondary | ICD-10-CM | POA: Diagnosis not present

## 2023-01-02 DIAGNOSIS — E785 Hyperlipidemia, unspecified: Secondary | ICD-10-CM | POA: Diagnosis not present

## 2023-01-02 DIAGNOSIS — Z1212 Encounter for screening for malignant neoplasm of rectum: Secondary | ICD-10-CM | POA: Diagnosis not present

## 2023-01-02 DIAGNOSIS — I10 Essential (primary) hypertension: Secondary | ICD-10-CM | POA: Diagnosis not present

## 2023-01-02 DIAGNOSIS — Z79899 Other long term (current) drug therapy: Secondary | ICD-10-CM | POA: Diagnosis not present

## 2023-01-05 ENCOUNTER — Other Ambulatory Visit (HOSPITAL_COMMUNITY): Payer: Self-pay

## 2023-01-09 DIAGNOSIS — Z0000 Encounter for general adult medical examination without abnormal findings: Secondary | ICD-10-CM | POA: Diagnosis not present

## 2023-01-09 DIAGNOSIS — E291 Testicular hypofunction: Secondary | ICD-10-CM | POA: Diagnosis not present

## 2023-01-09 DIAGNOSIS — I10 Essential (primary) hypertension: Secondary | ICD-10-CM | POA: Diagnosis not present

## 2023-01-09 DIAGNOSIS — G4733 Obstructive sleep apnea (adult) (pediatric): Secondary | ICD-10-CM | POA: Diagnosis not present

## 2023-01-09 DIAGNOSIS — E785 Hyperlipidemia, unspecified: Secondary | ICD-10-CM | POA: Diagnosis not present

## 2023-01-09 DIAGNOSIS — N320 Bladder-neck obstruction: Secondary | ICD-10-CM | POA: Diagnosis not present

## 2023-01-09 DIAGNOSIS — F900 Attention-deficit hyperactivity disorder, predominantly inattentive type: Secondary | ICD-10-CM | POA: Diagnosis not present

## 2023-01-10 ENCOUNTER — Other Ambulatory Visit (HOSPITAL_COMMUNITY): Payer: Self-pay

## 2023-01-10 ENCOUNTER — Other Ambulatory Visit: Payer: Self-pay

## 2023-01-10 MED ORDER — DOXEPIN HCL 10 MG/ML PO CONC
3.0000 mg | Freq: Every evening | ORAL | 3 refills | Status: DC | PRN
  Filled 2023-01-10: qty 50, 166d supply, fill #0
  Filled 2023-08-09 (×3): qty 50, 100d supply, fill #1
  Filled 2023-08-21: qty 50, 100d supply, fill #0

## 2023-01-12 ENCOUNTER — Other Ambulatory Visit (HOSPITAL_COMMUNITY): Payer: Self-pay

## 2023-01-12 ENCOUNTER — Other Ambulatory Visit: Payer: Self-pay

## 2023-01-15 ENCOUNTER — Other Ambulatory Visit (HOSPITAL_COMMUNITY): Payer: Self-pay

## 2023-01-15 ENCOUNTER — Other Ambulatory Visit: Payer: Self-pay

## 2023-01-15 MED ORDER — METHYLPHENIDATE HCL ER 20 MG PO TBCR: 20.0000 mg | EXTENDED_RELEASE_TABLET | Freq: Every day | ORAL | 0 refills | Status: AC

## 2023-01-16 ENCOUNTER — Other Ambulatory Visit (HOSPITAL_COMMUNITY): Payer: Self-pay

## 2023-01-16 DIAGNOSIS — R82998 Other abnormal findings in urine: Secondary | ICD-10-CM | POA: Diagnosis not present

## 2023-01-17 ENCOUNTER — Other Ambulatory Visit (HOSPITAL_COMMUNITY): Payer: Self-pay

## 2023-01-19 DIAGNOSIS — N401 Enlarged prostate with lower urinary tract symptoms: Secondary | ICD-10-CM | POA: Diagnosis not present

## 2023-01-19 DIAGNOSIS — R3912 Poor urinary stream: Secondary | ICD-10-CM | POA: Diagnosis not present

## 2023-01-23 DIAGNOSIS — R3912 Poor urinary stream: Secondary | ICD-10-CM | POA: Diagnosis not present

## 2023-01-23 DIAGNOSIS — N401 Enlarged prostate with lower urinary tract symptoms: Secondary | ICD-10-CM | POA: Diagnosis not present

## 2023-01-23 DIAGNOSIS — R350 Frequency of micturition: Secondary | ICD-10-CM | POA: Diagnosis not present

## 2023-01-30 DIAGNOSIS — E291 Testicular hypofunction: Secondary | ICD-10-CM | POA: Diagnosis not present

## 2023-02-10 ENCOUNTER — Other Ambulatory Visit (HOSPITAL_COMMUNITY): Payer: Self-pay

## 2023-02-10 MED ORDER — METHYLPHENIDATE HCL ER 20 MG PO TBCR
20.0000 mg | EXTENDED_RELEASE_TABLET | Freq: Every day | ORAL | 0 refills | Status: AC
  Filled 2023-02-10: qty 30, 30d supply, fill #0

## 2023-02-10 MED ORDER — GABAPENTIN 300 MG PO CAPS
300.0000 mg | ORAL_CAPSULE | Freq: Every day | ORAL | 3 refills | Status: AC | PRN
  Filled 2023-02-10 – 2023-08-21 (×2): qty 90, 90d supply, fill #0

## 2023-02-12 ENCOUNTER — Other Ambulatory Visit (HOSPITAL_COMMUNITY): Payer: Self-pay

## 2023-02-13 ENCOUNTER — Other Ambulatory Visit (HOSPITAL_COMMUNITY): Payer: Self-pay

## 2023-02-13 ENCOUNTER — Other Ambulatory Visit (HOSPITAL_BASED_OUTPATIENT_CLINIC_OR_DEPARTMENT_OTHER): Payer: Self-pay

## 2023-02-16 ENCOUNTER — Other Ambulatory Visit: Payer: Self-pay

## 2023-02-19 ENCOUNTER — Other Ambulatory Visit (HOSPITAL_COMMUNITY): Payer: Self-pay

## 2023-02-21 ENCOUNTER — Other Ambulatory Visit: Payer: Self-pay

## 2023-02-21 ENCOUNTER — Other Ambulatory Visit (HOSPITAL_COMMUNITY): Payer: Self-pay

## 2023-02-21 MED ORDER — GABAPENTIN 300 MG PO CAPS
300.0000 mg | ORAL_CAPSULE | Freq: Every day | ORAL | 3 refills | Status: AC | PRN
  Filled 2023-02-21: qty 90, 90d supply, fill #0

## 2023-02-21 MED ORDER — AMMONIUM LACTATE 12 % EX LOTN
1.0000 | TOPICAL_LOTION | Freq: Two times a day (BID) | CUTANEOUS | 3 refills | Status: DC
  Filled 2023-02-21: qty 225, 30d supply, fill #0
  Filled 2023-02-22: qty 225, 100d supply, fill #0
  Filled 2023-05-26: qty 225, 100d supply, fill #1
  Filled 2023-08-21: qty 400, 200d supply, fill #0
  Filled 2023-10-31: qty 225, 100d supply, fill #0
  Filled 2024-02-02: qty 225, 100d supply, fill #1

## 2023-02-21 MED ORDER — BUPROPION HCL ER (SR) 150 MG PO TB12
150.0000 mg | ORAL_TABLET | Freq: Two times a day (BID) | ORAL | 3 refills | Status: AC
  Filled 2023-02-21 – 2023-11-06 (×3): qty 180, 90d supply, fill #0
  Filled 2024-01-28: qty 180, 90d supply, fill #1

## 2023-02-22 ENCOUNTER — Other Ambulatory Visit: Payer: Self-pay

## 2023-02-22 ENCOUNTER — Other Ambulatory Visit (HOSPITAL_COMMUNITY): Payer: Self-pay

## 2023-02-27 ENCOUNTER — Other Ambulatory Visit: Payer: Self-pay

## 2023-02-27 ENCOUNTER — Other Ambulatory Visit (HOSPITAL_COMMUNITY): Payer: Self-pay

## 2023-03-01 ENCOUNTER — Other Ambulatory Visit: Payer: Self-pay

## 2023-03-01 ENCOUNTER — Other Ambulatory Visit (HOSPITAL_COMMUNITY): Payer: Self-pay

## 2023-03-02 ENCOUNTER — Other Ambulatory Visit (HOSPITAL_COMMUNITY): Payer: Self-pay

## 2023-03-02 ENCOUNTER — Other Ambulatory Visit: Payer: Self-pay

## 2023-03-05 DIAGNOSIS — N401 Enlarged prostate with lower urinary tract symptoms: Secondary | ICD-10-CM | POA: Diagnosis not present

## 2023-03-05 DIAGNOSIS — N3943 Post-void dribbling: Secondary | ICD-10-CM | POA: Diagnosis not present

## 2023-03-05 DIAGNOSIS — R3912 Poor urinary stream: Secondary | ICD-10-CM | POA: Diagnosis not present

## 2023-03-12 DIAGNOSIS — H524 Presbyopia: Secondary | ICD-10-CM | POA: Diagnosis not present

## 2023-03-12 DIAGNOSIS — H2513 Age-related nuclear cataract, bilateral: Secondary | ICD-10-CM | POA: Diagnosis not present

## 2023-03-17 ENCOUNTER — Other Ambulatory Visit (HOSPITAL_COMMUNITY): Payer: Self-pay

## 2023-03-19 ENCOUNTER — Other Ambulatory Visit (HOSPITAL_COMMUNITY): Payer: Self-pay

## 2023-03-19 MED ORDER — METHYLPHENIDATE HCL ER 20 MG PO TBCR
20.0000 mg | EXTENDED_RELEASE_TABLET | Freq: Every day | ORAL | 0 refills | Status: AC
  Filled 2023-03-19 – 2023-03-26 (×3): qty 30, 30d supply, fill #0

## 2023-03-20 ENCOUNTER — Other Ambulatory Visit: Payer: Self-pay

## 2023-03-23 ENCOUNTER — Other Ambulatory Visit: Payer: Self-pay | Admitting: Urology

## 2023-03-23 ENCOUNTER — Other Ambulatory Visit (HOSPITAL_COMMUNITY): Payer: Self-pay

## 2023-03-24 ENCOUNTER — Other Ambulatory Visit (HOSPITAL_COMMUNITY): Payer: Self-pay

## 2023-03-26 ENCOUNTER — Other Ambulatory Visit (HOSPITAL_COMMUNITY): Payer: Self-pay

## 2023-04-02 ENCOUNTER — Other Ambulatory Visit (HOSPITAL_COMMUNITY): Payer: Self-pay

## 2023-04-05 NOTE — Patient Instructions (Signed)
 SURGICAL WAITING ROOM VISITATION  Patients having surgery or a procedure may have no more than 2 support people in the waiting area - these visitors may rotate.    Children under the age of 20 must have an adult with them who is not the patient.  Due to an increase in RSV and influenza rates and associated hospitalizations, children ages 75 and under may not visit patients in [facility] hospitals.  Visitors with respiratory illnesses are discouraged from visiting and should remain at home.  If the patient needs to stay at the hospital during part of their recovery, the visitor guidelines for inpatient rooms apply. Pre-op nurse will coordinate an appropriate time for 1 support person to accompany patient in pre-op.  This support person may not rotate.    Please refer to the [facility] website for the visitor guidelines for Inpatients (after your surgery is over and you are in a regular room).    Your procedure is scheduled on: 04/11/23   Report to [facility] Main Entrance    Report to admitting at 7:30 AM   Call this number if you have problems the morning of surgery [phone]   Do not eat food or drink liquids :After Midnight.   After Midnight you may have the following liquids until ______ AM/ PM DAY OF SURGERY  Water Non-Citrus Juices (without pulp, NO RED-Apple, White grape, White cranberry) Black Coffee (NO MILK/CREAM OR CREAMERS, sugar ok)  Clear Tea (NO MILK/CREAM OR CREAMERS, sugar ok) regular and decaf                             Plain Jell-O (NO RED)                                           Fruit ices (not with fruit pulp, NO RED)                                     Popsicles (NO RED)                                                               Sports drinks like Gatorade (NO RED)                      If you have questions, please contact your surgeon's office.   FOLLOW BOWEL PREP AND ANY ADDITIONAL PRE OP INSTRUCTIONS YOU RECEIVED FROM YOUR SURGEON'S  OFFICE!!!     Oral Hygiene is also important to reduce your risk of infection.                                    Remember - BRUSH YOUR TEETH THE MORNING OF SURGERY WITH YOUR REGULAR TOOTHPASTE  DENTURES WILL BE REMOVED PRIOR TO SURGERY PLEASE DO NOT APPLY "Poly grip" OR ADHESIVES!!!   Do NOT smoke after Midnight   Stop all vitamins and herbal supplements 7 days before surgery.   Take these  medicines the morning of surgery with A SIP OF WATER: Tylenol, Amlodipine, Bupropion, Zetia, Famotidine, Gabapentin, Metoprolol, Tamsulosin  DO NOT TAKE ANY ORAL DIABETIC MEDICATIONS DAY OF YOUR SURGERY  Bring CPAP mask and tubing day of surgery.                              You may not have any metal on your body including jewelry, and body piercing             Do not wear lotions, powders, cologne, or deodorant              Men may shave face and neck.   Do not bring valuables to the hospital. [facility] IS NOT             RESPONSIBLE   FOR VALUABLES.   Contacts, glasses, dentures or bridgework may not be worn into surgery.   Bring small overnight bag day of surgery.   DO NOT BRING YOUR HOME MEDICATIONS TO THE HOSPITAL. PHARMACY WILL DISPENSE MEDICATIONS LISTED ON YOUR MEDICATION LIST TO YOU DURING YOUR ADMISSION IN THE HOSPITAL!   Special Instructions: Bring a copy of your healthcare power of attorney and living will documents the day of surgery if you haven't scanned them before.              Please read over the following fact sheets you were given: IF YOU HAVE QUESTIONS ABOUT YOUR PRE-OP INSTRUCTIONS PLEASE CALL [name]    If you received a COVID test during your pre-op visit  it is requested that you wear a mask when out in public, stay away from anyone that may not be feeling well and notify your surgeon if you develop symptoms. If you test positive for Covid or have been in contact with anyone that has tested positive in the last 10 days please notify you surgeon.     Josh[facility] [name] - Preparing for Surgery Before surgery, you can play an important role.  Because skin is not sterile, your skin needs to be as free of germs as possible.  You can reduce the number of germs on your skin by washing with CHG (chlorahexidine gluconate) soap before surgery.  CHG is an antiseptic cleaner which kills germs and bonds with the skin to continue killing germs even after washing. Please DO NOT use if you have an allergy to CHG or antibacterial soaps.  If your skin becomes reddened/irritated stop using the CHG and inform your nurse when you arrive at Short Stay. Do not shave (including legs and underarms) for at least 48 hours prior to the first CHG shower.  You may shave your face/neck.  Please follow these instructions carefully:  1.  Shower with CHG Soap the night before surgery and the  morning of surgery.  2.  If you choose to wash your hair, wash your hair first as usual with your normal  shampoo.  3.  After you shampoo, rinse your hair and body thoroughly to remove the shampoo.                             4.  Use CHG as you would any other liquid soap.  You can apply chg directly to the skin and wash.  Gently with a scrungie or clean washcloth.  5.  Apply the CHG Soap to your body ONLY FROM THE NECK DOWN.  Do   not use on face/ open                           Wound or open sores. Avoid contact with eyes, ears mouth and   genitals (private parts).                       Wash face,  Genitals (private parts) with your normal soap.             6.  Wash thoroughly, paying special attention to the area where your    surgery  will be performed.  7.  Thoroughly rinse your body with warm water from the neck down.  8.  DO NOT shower/wash with your normal soap after using and rinsing off the CHG Soap.                9.  Pat yourself dry with a clean towel.            10.  Wear clean pajamas.            11.  Place clean sheets on your bed the night of your first shower and do not  sleep  with pets. Day of Surgery : Do not apply any lotions/deodorants the morning of surgery.  Please wear clean clothes to the hospital/surgery center.  FAILURE TO FOLLOW THESE INSTRUCTIONS MAY RESULT IN THE CANCELLATION OF YOUR SURGERY  PATIENT SIGNATURE_________________________________  NURSE SIGNATURE__________________________________  ________________________________________________________________________

## 2023-04-05 NOTE — Progress Notes (Signed)
 COVID Vaccine Completed:  Date of COVID positive in last 90 days:  PCP - Jarome Matin, MD Cardiologist - Truett Mainland, MD LOV 02/17/22 for pre op clearance   Chest x-ray -  EKG -  Stress Test -  ECHO - 02/17/22 Epic Cardiac Cath -  Pacemaker/ICD device last checked: Spinal Cord Stimulator:  Bowel Prep -   Sleep Study -  CPAP -   Fasting Blood Sugar -  Checks Blood Sugar _____ times a day  Last dose of GLP1 agonist-  N/A GLP1 instructions:  Hold 7 days before surgery    Last dose of SGLT-2 inhibitors-  N/A SGLT-2 instructions:  Hold 3 days before surgery    Blood Thinner Instructions:  Last dose:   Time: Aspirin Instructions: Last Dose:  Activity level:  Can go up a flight of stairs and perform activities of daily living without stopping and without symptoms of chest pain or shortness of breath.  Able to exercise without symptoms  Unable to go up a flight of stairs without symptoms of     Anesthesia review: OSA, AAA, HTN, MVR, upper airway resistance syndrome, anemia  Patient denies shortness of breath, fever, cough and chest pain at PAT appointment  Patient verbalized understanding of instructions that were given to them at the PAT appointment. Patient was also instructed that they will need to review over the PAT instructions again at home before surgery.

## 2023-04-06 ENCOUNTER — Encounter (HOSPITAL_COMMUNITY)
Admission: RE | Admit: 2023-04-06 | Discharge: 2023-04-06 | Disposition: A | Discharge status: Home / Self Care | Payer: HMO | Source: Ambulatory Visit | Attending: Urology | Admitting: Urology

## 2023-04-06 ENCOUNTER — Encounter (HOSPITAL_COMMUNITY): Payer: Self-pay

## 2023-04-06 ENCOUNTER — Other Ambulatory Visit: Payer: Self-pay

## 2023-04-06 VITALS — BP 135/85 | HR 55 | Temp 97.6°F | Resp 16 | Ht 66.0 in | Wt 143.0 lb

## 2023-04-06 DIAGNOSIS — G4733 Obstructive sleep apnea (adult) (pediatric): Secondary | ICD-10-CM | POA: Diagnosis not present

## 2023-04-06 DIAGNOSIS — I10 Essential (primary) hypertension: Secondary | ICD-10-CM | POA: Diagnosis not present

## 2023-04-06 DIAGNOSIS — G629 Polyneuropathy, unspecified: Secondary | ICD-10-CM | POA: Insufficient documentation

## 2023-04-06 DIAGNOSIS — F419 Anxiety disorder, unspecified: Secondary | ICD-10-CM | POA: Diagnosis not present

## 2023-04-06 DIAGNOSIS — K219 Gastro-esophageal reflux disease without esophagitis: Secondary | ICD-10-CM | POA: Insufficient documentation

## 2023-04-06 DIAGNOSIS — Z01818 Encounter for other preprocedural examination: Secondary | ICD-10-CM | POA: Diagnosis not present

## 2023-04-06 DIAGNOSIS — N401 Enlarged prostate with lower urinary tract symptoms: Secondary | ICD-10-CM | POA: Diagnosis not present

## 2023-04-06 DIAGNOSIS — I7143 Infrarenal abdominal aortic aneurysm, without rupture: Secondary | ICD-10-CM | POA: Insufficient documentation

## 2023-04-06 HISTORY — DX: Abdominal aortic aneurysm, without rupture, unspecified: I71.40

## 2023-04-06 LAB — CBC
HCT: 48.2 % (ref 39.0–52.0)
Hemoglobin: 15.4 g/dL (ref 13.0–17.0)
MCH: 29.4 pg (ref 26.0–34.0)
MCHC: 32 g/dL (ref 30.0–36.0)
MCV: 92.2 fL (ref 80.0–100.0)
Platelets: 228 10*3/uL (ref 150–400)
RBC: 5.23 MIL/uL (ref 4.22–5.81)
RDW: 12.2 % (ref 11.5–15.5)
WBC: 7.6 10*3/uL (ref 4.0–10.5)
nRBC: 0 % (ref 0.0–0.2)

## 2023-04-06 LAB — BASIC METABOLIC PANEL
Anion gap: 4 — ABNORMAL LOW (ref 5–15)
BUN: 23 mg/dL (ref 8–23)
CO2: 26 mmol/L (ref 22–32)
Calcium: 9.1 mg/dL (ref 8.9–10.3)
Chloride: 106 mmol/L (ref 98–111)
Creatinine, Ser: 0.97 mg/dL (ref 0.61–1.24)
GFR, Estimated: >60 mL/min (ref >60)
Glucose, Bld: 91 mg/dL (ref 70–99)
Potassium: 4.8 mmol/L (ref 3.5–5.1)
Sodium: 136 mmol/L (ref 135–145)

## 2023-04-09 ENCOUNTER — Encounter (HOSPITAL_COMMUNITY): Payer: Self-pay | Admitting: Physician Assistant

## 2023-04-09 ENCOUNTER — Encounter (HOSPITAL_COMMUNITY): Payer: Self-pay

## 2023-04-09 NOTE — Progress Notes (Signed)
 Case: [id number] Date/Time: 04/11/23 0930   Procedure: TRANSURETHRAL RESECTION OF THE PROSTATE (TURP) - 90 MINUTES NEEDED   Anesthesia type: General   Pre-op diagnosis: BENIGN PROSTATIC HYPERPLASIA WITH LOWER URINARY TRACT SYMPTOMS   Location: [facility] PROCEDURE ROOM / [facility]   Surgeons: Rene Paci, MD       DISCUSSION: Joshua [name] is a 73 yo male who presents to PAT prior to surgery above. PMH of HTN, infrarenal AAA, OSA (uses CPAP), GERD, neuropathy, hx of ACDF C3-C6 (2018), PLIF (02/2022), anxiety, bladder outlet obstruction  Prior complications from anesthesia include prolonged emergence (after colonoscopy)  Patient has hx of infrarenal AAA. [place] of Aorta on 12/21/2017 measured 3.3 x 3.5cm. Most recent imaging is CT lumbar spine on 12/14/2021 and it was measuring 4.8cm. 6 month f/u and Vascular consult recommended.  Seen by Cardiology on 02/17/2022 for pre op clearance prior to knee surgery.  Echo was performed which showed normal EF and mild-mod MR. Advised f/u in 2 years.   Recommend patient has updated screening of his AAA prior to surgery. [facility] notified.  VS: BP 135/85   Pulse (!) 55   Temp 36.4 C (Oral)   Resp 16   Ht 5\' 6"  (1.676 m)   Wt 64.9 kg   SpO2 100%   BMI 23.08 kg/m   PROVIDERS: Garlan Fillers, MD   LABS: Labs reviewed: Acceptable for surgery. (all labs ordered are listed, but only abnormal results are displayed)  Labs Reviewed  BASIC METABOLIC PANEL - Abnormal; Notable for the following components:      Result Value   Anion gap 4 (*)    All other components within normal limits  CBC     IMAGES: CT Lumbar 12/14/2021:  IMPRESSION: 1. Infrarenal Abdominal Aortic Aneurysm, estimated up to 4.6 cm diameter on this noncontrast exam. Recommend follow-up every 6 months AND Vascular Surgery consultation. Reference: J Am Coll Radiol 2013;10:789-794. Aortic aneurysm NOS (ICD10-I71.9). Aortic Atherosclerosis (ICD10-I70.0).   2.  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  EKG:   CV:  Echocardiogram 02/17/2022: Left ventricle cavity is normal in size and wall thickness. Normal global wall motion. Normal LV systolic function with EF 53%. Doppler evidence of grade I (impaired) diastolic dysfunction, normal LAP.  Trileaflet aortic valve. Moderate aortic valve leaflet calcification. Aortic sclerosis. Trace aortic regurgitation. Mild to moderate mitral regurgitation. Mild tricuspid regurgitation. Mild pulmonic regurgitation. No evidence of pulmonary hypertension.    Past Medical History:  Diagnosis Date   Anemia    as a child   Anxiety    Arthritis    Complication of anesthesia    Hard to wake up after colonoscopy   Depression    takes Wellbutrin daily   GERD (gastroesophageal reflux disease)    Hyperlipidemia    takes Simvastatin daily   Hypertension    takes Metoprolol and Hyzaar daily    Muscle spasm    takes Robaxin as needed   Nerve pain    takes Gabapentin as needed   OSA (obstructive sleep apnea) 12/01/2013   16.6 AHI and RDI of over 30. REM AHI over 30.     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 and states it is very well controlled   Urinary frequency    Weakness    numbness and tingling in right arm down to fingers    Past Surgical History:  Procedure  Laterality Date   ANKLE ARTHROPLASTY Right    ANTERIOR CERVICAL DECOMP/DISCECTOMY FUSION N/A 06/28/2016   Procedure: ANTERIOR CERVICAL DECOMPRESSION FUSION CERVICAL THREE-FOUR ,CERVICAL FOUR-FIVE,CERVICAL FIVE-SIX;  Surgeon: Shirlean Royanne Warshaw, MD;  Location: [facility] OR;  Service: Neurosurgery;  Laterality: N/A;   APPENDECTOMY     pt deneis   COLONOSCOPY     lap inguinal hernia repair wiht mesh Bilateral 10/2003   REFRACTIVE SURGERY  2003   TONSILLECTOMY     TOTAL KNEE ARTHROPLASTY Left 06/26/2022    Procedure: TOTAL KNEE ARTHROPLASTY;  Surgeon: Ollen Gross, MD;  Location: [facility]S;  Service: Orthopedics;  Laterality: Left;   wisdom teeth extracted      MEDICATIONS:  acetaminophen (TYLENOL) 650 MG CR tablet   amLODipine (NORVASC) 5 MG tablet   amLODipine (NORVASC) 5 MG tablet   amLODipine (NORVASC) 5 MG tablet   ammonium lactate (LAC-HYDRIN) 12 % lotion   amoxicillin (AMOXIL) 500 MG capsule   b complex vitamins capsule   buPROPion (WELLBUTRIN SR) 150 MG 12 hr tablet   buPROPion (WELLBUTRIN SR) 150 MG 12 hr tablet   buPROPion (WELLBUTRIN SR) 150 MG 12 hr tablet   Calcium Carb-Cholecalciferol (CALCIUM 600 + D PO)   Cholecalciferol (VITAMIN D3 ULTRA STRENGTH) 125 MCG (5000 UT) capsule   doxepin (SINEQUAN) 10 MG/ML solution   ezetimibe (ZETIA) 10 MG tablet   ezetimibe (ZETIA) 10 MG tablet   ezetimibe (ZETIA) 10 MG tablet   famciclovir (FAMVIR) 125 MG tablet   famotidine (PEPCID) 20 MG tablet   famotidine (PEPCID) 20 MG tablet   gabapentin (NEURONTIN) 300 MG capsule   gabapentin (NEURONTIN) 300 MG capsule   gabapentin (NEURONTIN) 300 MG capsule   gabapentin (NEURONTIN) 300 MG capsule   ibuprofen (ADVIL) 200 MG tablet   losartan (COZAAR) 50 MG tablet   losartan (COZAAR) 50 MG tablet   losartan (COZAAR) 50 MG tablet   methocarbamol (ROBAXIN) 500 MG tablet   methylphenidate (METADATE ER) 20 MG ER tablet   methylphenidate (METADATE ER) 20 MG ER tablet   methylphenidate (METADATE ER) 20 MG ER tablet   methylphenidate (METADATE ER) 20 MG ER tablet   methylphenidate (METADATE ER) 20 MG ER tablet   methylphenidate (METADATE ER) 20 MG ER tablet   methylphenidate (METADATE ER) 20 MG ER tablet   methylphenidate (METADATE ER) 20 MG ER tablet   methylphenidate (METADATE ER) 20 MG ER tablet   metoprolol succinate (TOPROL-XL) 25 MG 24 hr tablet   metoprolol succinate (TOPROL-XL) 25 MG 24 hr tablet   metoprolol succinate (TOPROL-XL) 25 MG 24 hr tablet   Multiple Vitamin (MULTIVITAMINS  PO)   ondansetron (ZOFRAN) 4 MG tablet   oxyCODONE (OXY IR/ROXICODONE) 5 MG immediate release tablet   simvastatin (ZOCOR) 40 MG tablet   simvastatin (ZOCOR) 40 MG tablet   SYRINGE-NEEDLE, DISP, 3 ML (B-D 3CC LUER-LOK SYR 18GX1-1/2) 18G X 1-1/2" 3 ML MISC   tamsulosin (FLOMAX) 0.4 MG CAPS capsule   tamsulosin (FLOMAX) 0.4 MG CAPS capsule   tamsulosin (FLOMAX) 0.4 MG CAPS capsule   tamsulosin (FLOMAX) 0.4 MG CAPS capsule   testosterone cypionate (DEPOTESTOSTERONE CYPIONATE) 200 MG/ML injection   testosterone cypionate (DEPOTESTOSTERONE CYPIONATE) 200 MG/ML injection   traMADol (ULTRAM) 50 MG tablet   No current facility-administered medications for this encounter.   Marcille Blanco [facility] Short Stay/Anesthesiology [facility] Phone [phone] 04/10/2023 10:35 AM

## 2023-04-11 ENCOUNTER — Ambulatory Visit (HOSPITAL_COMMUNITY): Admission: RE | Admit: 2023-04-11 | Payer: HMO | Source: Home / Self Care | Admitting: Urology

## 2023-04-11 ENCOUNTER — Encounter (HOSPITAL_COMMUNITY): Admission: RE | Payer: Self-pay | Source: Home / Self Care

## 2023-04-11 SURGERY — TURP (TRANSURETHRAL RESECTION OF PROSTATE)
Anesthesia: General

## 2023-04-13 DIAGNOSIS — L570 Actinic keratosis: Secondary | ICD-10-CM | POA: Diagnosis not present

## 2023-04-13 DIAGNOSIS — L308 Other specified dermatitis: Secondary | ICD-10-CM | POA: Diagnosis not present

## 2023-04-13 DIAGNOSIS — L821 Other seborrheic keratosis: Secondary | ICD-10-CM | POA: Diagnosis not present

## 2023-04-13 DIAGNOSIS — B078 Other viral warts: Secondary | ICD-10-CM | POA: Diagnosis not present

## 2023-04-13 DIAGNOSIS — L2989 Other pruritus: Secondary | ICD-10-CM | POA: Diagnosis not present

## 2023-04-13 DIAGNOSIS — D225 Melanocytic nevi of trunk: Secondary | ICD-10-CM | POA: Diagnosis not present

## 2023-04-13 DIAGNOSIS — D2261 Melanocytic nevi of right upper limb, including shoulder: Secondary | ICD-10-CM | POA: Diagnosis not present

## 2023-04-16 ENCOUNTER — Other Ambulatory Visit (HOSPITAL_COMMUNITY): Payer: Self-pay

## 2023-04-16 MED ORDER — AMOXICILLIN 500 MG PO CAPS
2000.0000 mg | ORAL_CAPSULE | Freq: Once | ORAL | 0 refills | Status: AC
Start: 2023-04-16 — End: 2023-04-18
  Filled 2023-04-16: qty 8, 2d supply, fill #0

## 2023-04-17 ENCOUNTER — Other Ambulatory Visit (HOSPITAL_COMMUNITY): Payer: Self-pay

## 2023-04-17 ENCOUNTER — Other Ambulatory Visit: Payer: Self-pay | Admitting: Internal Medicine

## 2023-04-17 DIAGNOSIS — I714 Abdominal aortic aneurysm, without rupture, unspecified: Secondary | ICD-10-CM

## 2023-04-18 ENCOUNTER — Ambulatory Visit
Admission: RE | Admit: 2023-04-18 | Discharge: 2023-04-18 | Disposition: A | Discharge status: Home / Self Care | Source: Ambulatory Visit | Attending: Internal Medicine | Admitting: Internal Medicine

## 2023-04-18 ENCOUNTER — Other Ambulatory Visit (HOSPITAL_COMMUNITY): Payer: Self-pay

## 2023-04-18 DIAGNOSIS — I714 Abdominal aortic aneurysm, without rupture, unspecified: Secondary | ICD-10-CM

## 2023-04-18 MED ORDER — IOPAMIDOL (ISOVUE-370) INJECTION 76%
100.0000 mL | Freq: Once | INTRAVENOUS | Status: AC | PRN
  Administered 2023-04-18: 100 mL via INTRAVENOUS

## 2023-04-20 ENCOUNTER — Other Ambulatory Visit: Payer: Self-pay | Admitting: Urology

## 2023-04-27 ENCOUNTER — Other Ambulatory Visit (HOSPITAL_COMMUNITY): Payer: Self-pay

## 2023-04-27 ENCOUNTER — Other Ambulatory Visit: Payer: Self-pay

## 2023-05-01 ENCOUNTER — Ambulatory Visit (HOSPITAL_COMMUNITY): Admit: 2023-05-01 | Admitting: Urology

## 2023-05-01 SURGERY — TURP (TRANSURETHRAL RESECTION OF PROSTATE)
Anesthesia: General

## 2023-05-04 ENCOUNTER — Other Ambulatory Visit (HOSPITAL_COMMUNITY): Payer: Self-pay

## 2023-05-04 MED ORDER — METHYLPHENIDATE HCL ER 20 MG PO TBCR
20.0000 mg | EXTENDED_RELEASE_TABLET | Freq: Every day | ORAL | 0 refills | Status: AC
  Filled 2023-05-04 – 2023-05-07 (×2): qty 30, 30d supply, fill #0

## 2023-05-04 NOTE — Progress Notes (Addendum)
 COVID Vaccine received:  []  No [x]  Yes Date of any COVID positive Test in last 90 days: no PCP - Jarome Matin MD Cardiologist - Truett Mainland MD  Chest x-ray -  EKG -  04/06/23 Epic Stress Test -  ECHO - 02/17/22 Epic Cardiac Cath -   Bowel Prep - [x]  No  []   Yes ______  Pacemaker / ICD device [x]  No []  Yes   Spinal Cord Stimulator:[x]  No []  Yes       History of Sleep Apnea? []  No [x]  Yes   CPAP used?- []  No [x]  Yes    Does the patient monitor blood sugar?          [x]  No []  Yes  []  N/A  Patient has: [x]  NO Hx DM   []  Pre-DM                 []  DM1  []   DM2 Does patient have a [employer] or [employer]? []  No []  Yes   Fasting Blood Sugar Ranges-  Checks Blood Sugar _____ times a day  GLP1 agonist / usual dose - no GLP1 instructions:  SGLT-2 inhibitors / usual dose - no SGLT-2 instructions:   Blood Thinner / Instructions:no Aspirin Instructions:no  Comments:   Activity level: Patient is able  to climb a flight of stairs without difficulty; [x]  No CP  [x]  No SOB, __   Patient can  perform ADLs without assistance.   Anesthesia review: HTN, OSA, AAA, MVR  Patient denies shortness of breath, fever, cough and chest pain at PAT appointment.  Patient verbalized understanding and agreement to the Pre-Surgical Instructions that were given to them at this PAT appointment. Patient was also educated of the need to review these PAT instructions again prior to his/her surgery.I reviewed the appropriate phone numbers to call if they have any and questions or concerns.

## 2023-05-04 NOTE — Patient Instructions (Addendum)
 SURGICAL WAITING ROOM VISITATION  Patients having surgery or a procedure may have no more than 2 support people in the waiting area - these visitors may rotate.    Children under the age of 68 must have an adult with them who is not the patient.  Due to an increase in RSV and influenza rates and associated hospitalizations, children ages 20 and under may not visit patients in [facility] hospitals.  Visitors with respiratory illnesses are discouraged from visiting and should remain at home.  If the patient needs to stay at the hospital during part of their recovery, the visitor guidelines for inpatient rooms apply. Pre-op nurse will coordinate an appropriate time for 1 support person to accompany patient in pre-op.  This support person may not rotate.    Please refer to the [facility] website for the visitor guidelines for Inpatients (after your surgery is over and you are in a regular room).       Your procedure is scheduled on: 05/09/23   Report to [facility] Main Entrance    Report to admitting at 6:15 AM   Call this number if you have problems the morning of surgery [phone]   Do not eat food or drink liquids :After Midnight..May have sips of water with meds.      Oral Hygiene is also important to reduce your risk of infection.                                    Remember - BRUSH YOUR TEETH THE MORNING OF SURGERY WITH YOUR REGULAR TOOTHPASTE   Stop all vitamins and herbal supplements 7 days before surgery.   Take these medicines the morning of surgery with A SIP OF WATER: tylenol, amlodipine, wellbutrin, Zetia, famotidine, gabapentin, metoprlol, Simvastatin, tamsulosin  Bring CPAP mask and tubing day of surgery.                              You may not have any metal on your body including hair pins, jewelry, and body piercing             Do not wear make-up, lotions, powders, perfumes/cologne, or deodorant              Men may shave face and neck.   Do not  bring valuables to the hospital. [name] IS NOT             RESPONSIBLE   FOR VALUABLES.   Contacts, glasses, dentures or bridgework may not be worn into surgery.   Bring small overnight bag day of surgery.   DO NOT BRING YOUR HOME MEDICATIONS TO THE HOSPITAL. PHARMACY WILL DISPENSE MEDICATIONS LISTED ON YOUR MEDICATION LIST TO YOU DURING YOUR ADMISSION IN THE HOSPITAL!    Patients discharged on the day of surgery will not be allowed to drive home.  Someone NEEDS to stay with you for the first 24 hours after anesthesia.   Special Instructions: Bring a copy of your healthcare power of attorney and living will documents the day of surgery if you haven't scanned them before.              Please read over the following fact sheets you were given: IF YOU HAVE QUESTIONS ABOUT YOUR PRE-OP INSTRUCTIONS PLEASE CALL [phone] Joshua [name]   If you received a COVID test during your pre-op visit  it is requested that you wear a mask when out in public, stay away from anyone that may not be feeling well and notify your surgeon if you develop symptoms. If you test positive for Covid or have been in contact with anyone that has tested positive in the last 10 days please notify you surgeon.    [name] - Preparing for Surgery Before surgery, you can play an important role.  Because skin is not sterile, your skin needs to be as free of germs as possible.  You can reduce the number of germs on your skin by washing with CHG (chlorahexidine gluconate) soap before surgery.  CHG is an antiseptic cleaner which kills germs and bonds with the skin to continue killing germs even after washing. Please DO NOT use if you have an allergy to CHG or antibacterial soaps.  If your skin becomes reddened/irritated stop using the CHG and inform your nurse when you arrive at Short Stay. Do not shave (including legs and underarms) for at least 48 hours prior to the first CHG shower.  You may shave your face/neck.  Please  follow these instructions carefully:  1.  Shower with CHG Soap the night before surgery and the  morning of surgery.  2.  If you choose to wash your hair, wash your hair first as usual with your normal  shampoo.  3.  After you shampoo, rinse your hair and body thoroughly to remove the shampoo.                             4.  Use CHG as you would any other liquid soap.  You can apply chg directly to the skin and wash.  Gently with a scrungie or clean washcloth.  5.  Apply the CHG Soap to your body ONLY FROM THE NECK DOWN.   Do   not use on face/ open                           Wound or open sores. Avoid contact with eyes, ears mouth and   genitals (private parts).                       Wash face,  Genitals (private parts) with your normal soap.             6.  Wash thoroughly, paying special attention to the area where your    surgery  will be performed.  7.  Thoroughly rinse your body with warm water from the neck down.  8.  DO NOT shower/wash with your normal soap after using and rinsing off the CHG Soap.                9.  Pat yourself dry with a clean towel.            10.  Wear clean pajamas.            11.  Place clean sheets on your bed the night of your first shower and do not  sleep with pets. Day of Surgery : Do not apply any lotions/deodorants the morning of surgery.  Please wear clean clothes to the hospital/surgery center.  FAILURE TO FOLLOW THESE INSTRUCTIONS MAY RESULT IN THE CANCELLATION OF YOUR SURGERY  PATIENT SIGNATURE_________________________________  NURSE SIGNATURE__________________________________  ________________________________________________________________________

## 2023-05-07 ENCOUNTER — Other Ambulatory Visit: Payer: Self-pay

## 2023-05-07 ENCOUNTER — Encounter (HOSPITAL_COMMUNITY)
Admission: RE | Admit: 2023-05-07 | Discharge: 2023-05-07 | Disposition: A | Discharge status: Home / Self Care | Source: Ambulatory Visit | Attending: Urology | Admitting: Urology

## 2023-05-07 ENCOUNTER — Other Ambulatory Visit (HOSPITAL_COMMUNITY): Payer: Self-pay

## 2023-05-07 VITALS — BP 141/82 | HR 57 | Temp 97.8°F | Resp 18 | Ht 66.0 in | Wt 148.4 lb

## 2023-05-07 DIAGNOSIS — I700 Atherosclerosis of aorta: Secondary | ICD-10-CM | POA: Diagnosis not present

## 2023-05-07 DIAGNOSIS — Z01812 Encounter for preprocedural laboratory examination: Secondary | ICD-10-CM | POA: Insufficient documentation

## 2023-05-07 DIAGNOSIS — K219 Gastro-esophageal reflux disease without esophagitis: Secondary | ICD-10-CM | POA: Diagnosis not present

## 2023-05-07 DIAGNOSIS — I7143 Infrarenal abdominal aortic aneurysm, without rupture: Secondary | ICD-10-CM | POA: Insufficient documentation

## 2023-05-07 DIAGNOSIS — G4733 Obstructive sleep apnea (adult) (pediatric): Secondary | ICD-10-CM | POA: Diagnosis not present

## 2023-05-07 DIAGNOSIS — Z981 Arthrodesis status: Secondary | ICD-10-CM | POA: Insufficient documentation

## 2023-05-07 DIAGNOSIS — N401 Enlarged prostate with lower urinary tract symptoms: Secondary | ICD-10-CM | POA: Insufficient documentation

## 2023-05-07 DIAGNOSIS — I10 Essential (primary) hypertension: Secondary | ICD-10-CM | POA: Insufficient documentation

## 2023-05-07 DIAGNOSIS — N138 Other obstructive and reflux uropathy: Secondary | ICD-10-CM | POA: Insufficient documentation

## 2023-05-07 DIAGNOSIS — G629 Polyneuropathy, unspecified: Secondary | ICD-10-CM | POA: Insufficient documentation

## 2023-05-07 DIAGNOSIS — F419 Anxiety disorder, unspecified: Secondary | ICD-10-CM | POA: Diagnosis not present

## 2023-05-07 DIAGNOSIS — Z01818 Encounter for other preprocedural examination: Secondary | ICD-10-CM | POA: Diagnosis present

## 2023-05-07 LAB — CBC
HCT: 44.3 % (ref 39.0–52.0)
Hemoglobin: 14.3 g/dL (ref 13.0–17.0)
MCH: 29.3 pg (ref 26.0–34.0)
MCHC: 32.3 g/dL (ref 30.0–36.0)
MCV: 90.8 fL (ref 80.0–100.0)
Platelets: 227 10*3/uL (ref 150–400)
RBC: 4.88 MIL/uL (ref 4.22–5.81)
RDW: 12.2 % (ref 11.5–15.5)
WBC: 5.3 10*3/uL (ref 4.0–10.5)
nRBC: 0 % (ref 0.0–0.2)

## 2023-05-07 LAB — BASIC METABOLIC PANEL WITH GFR
Anion gap: 8 (ref 5–15)
BUN: 18 mg/dL (ref 8–23)
CO2: 24 mmol/L (ref 22–32)
Calcium: 9 mg/dL (ref 8.9–10.3)
Chloride: 107 mmol/L (ref 98–111)
Creatinine, Ser: 1.12 mg/dL (ref 0.61–1.24)
GFR, Estimated: >60 mL/min (ref >60)
Glucose, Bld: 144 mg/dL — ABNORMAL HIGH (ref 70–99)
Potassium: 4 mmol/L (ref 3.5–5.1)
Sodium: 139 mmol/L (ref 135–145)

## 2023-05-08 NOTE — Progress Notes (Signed)
 Case: [id number] Date/Time: 05/09/23 0815   Procedure: TRANSURETHRAL RESECTION OF THE PROSTATE (TURP) - 90 MINUTES NEEDED   Anesthesia type: General   Pre-op diagnosis: BENIGN PROSTATIC HYPERPLASIA WITH LOWER URINARY TRACT SYMPTOMS   Location: [facility] PROCEDURE [facility] / [facility]   Surgeons: Rene Paci, MD       DISCUSSION: Joshua [name] is a 73 yo male who presents to PAT prior to surgery above. PMH of HTN, aortic atherosclerosis, infrarenal AAA, OSA (uses CPAP), GERD, neuropathy, hx of ACDF C3-C6 (2018), PLIF (02/2022), anxiety, bladder outlet obstruction  Prior complications from anesthesia include prolonged emergence (after colonoscopy)   Patient has hx of infrarenal AAA. Imaging from CT lumbar spine on 12/14/2021 showed AAA measured 4.6cm. 6 month f/u and Vascular consult recommended at that time. His surgery was originally scheduled for 04/11/23 and updated screening of his AAA was recommended so surgery postponed. CTA of Abdomen done on 04/18/23 showed AAA was stable at 4.5cm.   Seen by Cardiology on 02/17/2022 for pre op clearance prior to knee surgery.  Echo was performed which showed normal EF and mild-mod MR. Advised f/u in 2 years.    VS: BP (!) 141/82   Pulse (!) 57   Temp 36.6 C (Oral)   Resp 18   Ht 5\' 6"  (1.676 m)   Wt 67.3 kg   SpO2 100%   BMI 23.95 kg/m   PROVIDERS: Garlan Fillers, MD   LABS: Labs reviewed: Acceptable for surgery. (all labs ordered are listed, but only abnormal results are displayed)  Labs Reviewed  BASIC METABOLIC PANEL WITH GFR - Abnormal; Notable for the following components:      Result Value   Glucose, Bld 144 (*)    All other components within normal limits  CBC     IMAGES:  CTA Abdomen/Pelvis 04/18/23  IMPRESSION: 1. 4.5 cm infrarenal abdominal aortic aneurysm. This is increased in size compared to 2019. Recommend follow-up CT or MR as appropriate in 12 months and referral to or continued care with vascular specialist.  (Ref.: J Vasc Surg. 2018; 67:2-77 and J Am Coll Radiol 2013;10(10):789-794.) 2. No acute localizing process in the abdomen or pelvis.   Aortic Atherosclerosis (ICD10-I70.0).      EKG 04/06/23  Sinus bradycardia with 1st degree A-V block, rate 56 Right superior axis deviation Non-specific intra-ventricular conduction delay  CV:  Echocardiogram 02/17/2022:  Left ventricle cavity is normal in size and wall thickness. Normal global wall motion. Normal LV systolic function with EF 53%. Doppler evidence of grade I (impaired) diastolic dysfunction, normal LAP.  Trileaflet aortic valve. Moderate aortic valve leaflet calcification. Aortic sclerosis. Trace aortic regurgitation. Mild to moderate mitral regurgitation. Mild tricuspid regurgitation. Mild pulmonic regurgitation. No evidence of pulmonary hypertension.  Past Medical History:  Diagnosis Date   AAA (abdominal aortic aneurysm) (HCC)    Anemia    as a child   Anxiety    Arthritis    Complication of anesthesia    Hard to wake up after colonoscopy   Depression    takes Wellbutrin daily   GERD (gastroesophageal reflux disease)    Hyperlipidemia    takes Simvastatin daily   Hypertension    takes Metoprolol and Hyzaar daily    Muscle spasm    takes Robaxin as needed   Nerve pain    takes Gabapentin as needed   OSA (obstructive sleep apnea) 12/01/2013   16.6 AHI and RDI of over 30. REM AHI over 30.     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 and states it is very well controlled   Urinary frequency    Weakness    numbness and tingling in right arm down to fingers    Past Surgical History:  Procedure Laterality Date   ANKLE ARTHROPLASTY Right    ANTERIOR CERVICAL DECOMP/DISCECTOMY FUSION N/A 06/28/2016   Procedure: ANTERIOR CERVICAL DECOMPRESSION FUSION CERVICAL THREE-FOUR ,CERVICAL FOUR-FIVE,CERVICAL FIVE-SIX;  Surgeon: Shirlean Weylyn Ricciuti, MD;  Location: [facility];  Service: Neurosurgery;  Laterality: N/A;   APPENDECTOMY      pt deneis   COLONOSCOPY     lap inguinal hernia repair wiht mesh Bilateral 10/2003   REFRACTIVE SURGERY  2003   TONSILLECTOMY     TOTAL KNEE ARTHROPLASTY Left 06/26/2022   Procedure: TOTAL KNEE ARTHROPLASTY;  Surgeon: Ollen Gross, MD;  Location: [facility];  Service: Orthopedics;  Laterality: Left;   wisdom teeth extracted      MEDICATIONS:  acetaminophen (TYLENOL) 650 MG CR tablet   amLODipine (NORVASC) 5 MG tablet   amLODipine (NORVASC) 5 MG tablet   amLODipine (NORVASC) 5 MG tablet   ammonium lactate (LAC-HYDRIN) 12 % lotion   amoxicillin (AMOXIL) 500 MG capsule   b complex vitamins capsule   buPROPion (WELLBUTRIN SR) 150 MG 12 hr tablet   buPROPion (WELLBUTRIN SR) 150 MG 12 hr tablet   buPROPion (WELLBUTRIN SR) 150 MG 12 hr tablet   Calcium Carb-Cholecalciferol (CALCIUM 600 + D PO)   Cholecalciferol (VITAMIN D3 ULTRA STRENGTH) 125 MCG (5000 UT) capsule   doxepin (SINEQUAN) 10 MG/ML solution   ezetimibe (ZETIA) 10 MG tablet   ezetimibe (ZETIA) 10 MG tablet   ezetimibe (ZETIA) 10 MG tablet   famciclovir (FAMVIR) 125 MG tablet   famotidine (PEPCID) 20 MG tablet   famotidine (PEPCID) 20 MG tablet   gabapentin (NEURONTIN) 300 MG capsule   gabapentin (NEURONTIN) 300 MG capsule   gabapentin (NEURONTIN) 300 MG capsule   gabapentin (NEURONTIN) 300 MG capsule   ibuprofen (ADVIL) 200 MG tablet   losartan (COZAAR) 50 MG tablet   losartan (COZAAR) 50 MG tablet   losartan (COZAAR) 50 MG tablet   methocarbamol (ROBAXIN) 500 MG tablet   methylphenidate (METADATE ER) 20 MG ER tablet   methylphenidate (METADATE ER) 20 MG ER tablet   methylphenidate (METADATE ER) 20 MG ER tablet   methylphenidate (METADATE ER) 20 MG ER tablet   methylphenidate (METADATE ER) 20 MG ER tablet   methylphenidate (METADATE ER) 20 MG ER tablet   methylphenidate (METADATE ER) 20 MG ER tablet   methylphenidate (METADATE ER) 20 MG ER tablet   methylphenidate (METADATE ER) 20 MG ER tablet   methylphenidate  (METADATE ER) 20 MG ER tablet   metoprolol succinate (TOPROL-XL) 25 MG 24 hr tablet   metoprolol succinate (TOPROL-XL) 25 MG 24 hr tablet   metoprolol succinate (TOPROL-XL) 25 MG 24 hr tablet   Multiple Vitamin (MULTIVITAMINS PO)   ondansetron (ZOFRAN) 4 MG tablet   oxyCODONE (OXY IR/ROXICODONE) 5 MG immediate release tablet   simvastatin (ZOCOR) 40 MG tablet   simvastatin (ZOCOR) 40 MG tablet   SYRINGE-NEEDLE, DISP, 3 ML (B-D 3CC LUER-LOK SYR 18GX1-1/2) 18G X 1-1/2" 3 ML MISC   tamsulosin (FLOMAX) 0.4 MG CAPS capsule   tamsulosin (FLOMAX) 0.4 MG CAPS capsule   tamsulosin (FLOMAX) 0.4 MG CAPS capsule   tamsulosin (FLOMAX) 0.4 MG CAPS capsule   testosterone cypionate (DEPOTESTOSTERONE CYPIONATE) 200 MG/ML injection   testosterone cypionate (DEPOTESTOSTERONE CYPIONATE) 200 MG/ML injection   traMADol (  ULTRAM) 50 MG tablet   No current facility-administered medications for this encounter.   Marcille Blanco [facility] Short Stay/Anesthesiology [facility] Phone [phone] 05/08/2023 8:42 AM

## 2023-05-08 NOTE — H&P (Signed)
 Urology Preoperative H&P   Chief Complaint: LUTS  History of Present Illness: Joshua [name] is a 73 y.o. male with with a history of BPH and hypogonadism.  Last PSA: 0.8 (2025), 1.522 (01/2021). Family history of prostate cancer involving his brother   His UroCuff revealed an obstructed flow pattern with a Q-Max of 9.3 mL/s along with elevated voiding pressures. Prostate volume was found to be 40.3 cm. He continues to report a weak FOS with PVD despite tamsulosin BID. Denies interval UTIs, dysuria or hematuria. PVR ~100 mL.  He is here today for a TURP.   Past Medical History:  Diagnosis Date   AAA (abdominal aortic aneurysm) (HCC)    Anemia    as a child   Anxiety    Arthritis    Complication of anesthesia    Hard to wake up after colonoscopy   Depression    takes Wellbutrin daily   GERD (gastroesophageal reflux disease)    Hyperlipidemia    takes Simvastatin daily   Hypertension    takes Metoprolol and Hyzaar daily    Muscle spasm    takes Robaxin as needed   Nerve pain    takes Gabapentin as needed   OSA (obstructive sleep apnea) 12/01/2013   16.6 AHI and RDI of over 30. REM AHI over 30.     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 and states it is very well controlled   Urinary frequency    Weakness    numbness and tingling in right arm down to fingers    Past Surgical History:  Procedure Laterality Date   ANKLE ARTHROPLASTY Right    ANTERIOR CERVICAL DECOMP/DISCECTOMY FUSION N/A 06/28/2016   Procedure: ANTERIOR CERVICAL DECOMPRESSION FUSION CERVICAL THREE-FOUR ,CERVICAL FOUR-FIVE,CERVICAL FIVE-SIX;  Surgeon: Shirlean Kelly, MD;  Location: [facility];  Service: Neurosurgery;  Laterality: N/A;   APPENDECTOMY     pt deneis   COLONOSCOPY     lap inguinal hernia repair wiht mesh Bilateral 10/2003   REFRACTIVE SURGERY  2003   TONSILLECTOMY     TOTAL KNEE ARTHROPLASTY Left 06/26/2022   Procedure: TOTAL KNEE ARTHROPLASTY;  Surgeon: Ollen Gross, MD;  Location: [facility];   Service: Orthopedics;  Laterality: Left;   wisdom teeth extracted      Allergies: No Known Allergies  Family History  Problem Relation Age of Onset   Lymphoma Mother    Lung disease Father    Parkinson's disease Father    Hypertension Father    Hypertension Sister    Hyperlipidemia Brother    Hypertension Brother    Stroke Maternal Grandmother    Ulcerative colitis Daughter     Social History:  reports that he has never smoked. He has never used smokeless tobacco. He reports current alcohol use of about 2.0 standard drinks of alcohol per week. He reports that he does not use drugs.  ROS: A complete review of systems was performed.  All systems are negative except for pertinent findings as noted.  Physical Exam:  Vital signs in last 24 hours: Temp:  [97.8 F (36.6 C)] 97.8 F (36.6 C) (03/31 0944) Pulse Rate:  [57] 57 (03/31 0944) Resp:  [18] 18 (03/31 0944) BP: (141)/(82) 141/82 (03/31 0944) SpO2:  [100 %] 100 % (03/31 0944) Weight:  [67.3 kg] 67.3 kg (03/31 0944) Constitutional:  Alert and oriented, No acute distress Cardiovascular: Regular rate and rhythm, No JVD Respiratory: Normal respiratory effort, Lungs clear bilaterally GI: Abdomen is soft, nontender, nondistended, no abdominal masses  GU: No CVA tenderness Lymphatic: No lymphadenopathy Neurologic: Grossly intact, no focal deficits Psychiatric: Normal mood and affect  Laboratory Data:  Recent Labs    05/07/23 0941  WBC 5.3  HGB 14.3  HCT 44.3  PLT 227    Recent Labs    05/07/23 0941  NA 139  K 4.0  CL 107  GLUCOSE 144*  BUN 18  CALCIUM 9.0  CREATININE 1.12     Results for orders placed or performed during the hospital encounter of 05/07/23 (from the past 24 hours)  CBC     Status: None   Collection Time: 05/07/23  9:41 AM  Result Value Ref Range   WBC 5.3 4.0 - 10.5 K/uL   RBC 4.88 4.22 - 5.81 MIL/uL   Hemoglobin 14.3 13.0 - 17.0 g/dL   HCT 52.8 41.3 - 24.4 %   MCV 90.8 80.0 - 100.0 fL    MCH 29.3 26.0 - 34.0 pg   MCHC 32.3 30.0 - 36.0 g/dL   RDW 01.0 27.2 - 53.6 %   Platelets 227 150 - 400 K/uL   nRBC 0.0 0.0 - 0.2 %  Basic metabolic panel     Status: Abnormal   Collection Time: 05/07/23  9:41 AM  Result Value Ref Range   Sodium 139 135 - 145 mmol/L   Potassium 4.0 3.5 - 5.1 mmol/L   Chloride 107 98 - 111 mmol/L   CO2 24 22 - 32 mmol/L   Glucose, Bld 144 (H) 70 - 99 mg/dL   BUN 18 8 - 23 mg/dL   Creatinine, Ser 6.44 0.61 - 1.24 mg/dL   Calcium 9.0 8.9 - 03.4 mg/dL   GFR, Estimated >74 >25 mL/min   Anion gap 8 5 - 15   No results found for this or any previous visit (from the past 240 hours).  Renal Function: Recent Labs    05/07/23 0941  CREATININE 1.12   Estimated Creatinine Clearance: 54.6 mL/min (by C-G formula based on SCr of 1.12 mg/dL).  Radiologic Imaging: No results found.  I independently reviewed the above imaging studies.  Assessment and Plan Joshua [name] is a 73 y.o. male with BPH w/ LUTS   -The risks, benefits and alternatives of cystoscopy with TURP was discussed with the patient.  The risks included, but are not limited to, bleeding,  urinary tract infection, bladder perforation requiring prolonged catheterization and/or open bladder repair, ureteral injury, ureteral obstruction, urethral stricture disease, new or worsening voiding dysfunction, retrograde ejaculation, MI, CVA, PE, DVT and the inherent risks of general anesthesia.  We also discussed the need for Foley catheterization for at least 3 days post-op and the likely need for post-op observation in the hospital following the procedure.  The patient voices understanding and wishes to proceed.   Rhoderick Moody, MD 05/08/2023, 7:41 AM  [facility] Pager: [phone]

## 2023-05-09 ENCOUNTER — Other Ambulatory Visit: Payer: Self-pay

## 2023-05-09 ENCOUNTER — Encounter (HOSPITAL_COMMUNITY)
Admission: RE | Disposition: A | Discharge status: Home / Self Care | Payer: Self-pay | Source: Home / Self Care | Attending: Urology

## 2023-05-09 ENCOUNTER — Ambulatory Visit (HOSPITAL_COMMUNITY): Payer: Self-pay | Admitting: Medical

## 2023-05-09 ENCOUNTER — Encounter (HOSPITAL_COMMUNITY): Payer: Self-pay | Admitting: Urology

## 2023-05-09 ENCOUNTER — Observation Stay (HOSPITAL_COMMUNITY)
Admission: RE | Admit: 2023-05-09 | Discharge: 2023-05-10 | Disposition: A | Discharge status: Home / Self Care | Attending: Urology | Admitting: Urology

## 2023-05-09 ENCOUNTER — Other Ambulatory Visit (HOSPITAL_COMMUNITY): Payer: Self-pay

## 2023-05-09 ENCOUNTER — Ambulatory Visit (HOSPITAL_BASED_OUTPATIENT_CLINIC_OR_DEPARTMENT_OTHER): Admitting: Certified Registered Nurse Anesthetist

## 2023-05-09 DIAGNOSIS — Z96652 Presence of left artificial knee joint: Secondary | ICD-10-CM | POA: Diagnosis not present

## 2023-05-09 DIAGNOSIS — E785 Hyperlipidemia, unspecified: Secondary | ICD-10-CM | POA: Diagnosis not present

## 2023-05-09 DIAGNOSIS — I10 Essential (primary) hypertension: Secondary | ICD-10-CM | POA: Insufficient documentation

## 2023-05-09 DIAGNOSIS — Z981 Arthrodesis status: Secondary | ICD-10-CM | POA: Insufficient documentation

## 2023-05-09 DIAGNOSIS — N320 Bladder-neck obstruction: Secondary | ICD-10-CM | POA: Insufficient documentation

## 2023-05-09 DIAGNOSIS — N401 Enlarged prostate with lower urinary tract symptoms: Secondary | ICD-10-CM | POA: Diagnosis not present

## 2023-05-09 DIAGNOSIS — N138 Other obstructive and reflux uropathy: Secondary | ICD-10-CM | POA: Diagnosis not present

## 2023-05-09 DIAGNOSIS — G4733 Obstructive sleep apnea (adult) (pediatric): Secondary | ICD-10-CM | POA: Diagnosis not present

## 2023-05-09 DIAGNOSIS — F418 Other specified anxiety disorders: Secondary | ICD-10-CM | POA: Diagnosis not present

## 2023-05-09 DIAGNOSIS — Z79899 Other long term (current) drug therapy: Secondary | ICD-10-CM | POA: Diagnosis not present

## 2023-05-09 DIAGNOSIS — N400 Enlarged prostate without lower urinary tract symptoms: Secondary | ICD-10-CM | POA: Diagnosis not present

## 2023-05-09 HISTORY — PX: TRANSURETHRAL RESECTION OF PROSTATE: SHX73

## 2023-05-09 SURGERY — TURP (TRANSURETHRAL RESECTION OF PROSTATE)
Anesthesia: General

## 2023-05-09 MED ORDER — MIDAZOLAM HCL 2 MG/2ML IJ SOLN
INTRAMUSCULAR | Status: DC | PRN
  Administered 2023-05-09: 2 mg via INTRAVENOUS

## 2023-05-09 MED ORDER — PHENYLEPHRINE 80 MCG/ML (10ML) SYRINGE FOR IV PUSH (FOR BLOOD PRESSURE SUPPORT)
PREFILLED_SYRINGE | INTRAVENOUS | Status: AC
  Filled 2023-05-09: qty 10

## 2023-05-09 MED ORDER — DIPHENHYDRAMINE HCL 50 MG/ML IJ SOLN
12.5000 mg | Freq: Four times a day (QID) | INTRAMUSCULAR | Status: DC | PRN
  Filled 2023-05-09: qty 1

## 2023-05-09 MED ORDER — PROPOFOL 10 MG/ML IV BOLUS
INTRAVENOUS | Status: DC | PRN
  Administered 2023-05-09: 150 mg via INTRAVENOUS

## 2023-05-09 MED ORDER — SODIUM CHLORIDE 0.9 % IR SOLN
Status: DC | PRN
  Administered 2023-05-09: 15000 mL via INTRAVESICAL

## 2023-05-09 MED ORDER — METOPROLOL SUCCINATE ER 50 MG PO TB24
25.0000 mg | ORAL_TABLET | Freq: Every day | ORAL | Status: DC
  Filled 2023-05-09: qty 1

## 2023-05-09 MED ORDER — MIDAZOLAM HCL 2 MG/2ML IJ SOLN
INTRAMUSCULAR | Status: AC
  Filled 2023-05-09: qty 2

## 2023-05-09 MED ORDER — ORAL CARE MOUTH RINSE: 15.0000 mL | Freq: Once | OROMUCOSAL | Status: AC

## 2023-05-09 MED ORDER — ZOLPIDEM TARTRATE 5 MG PO TABS
5.0000 mg | ORAL_TABLET | Freq: Every evening | ORAL | Status: DC | PRN
  Administered 2023-05-09: 5 mg via ORAL
  Filled 2023-05-09: qty 1

## 2023-05-09 MED ORDER — PROPOFOL 10 MG/ML IV BOLUS
INTRAVENOUS | Status: AC
Start: 2023-05-09 — End: ?
  Filled 2023-05-09: qty 20

## 2023-05-09 MED ORDER — BUPROPION HCL ER (SR) 150 MG PO TB12
150.0000 mg | ORAL_TABLET | Freq: Two times a day (BID) | ORAL | Status: DC
  Administered 2023-05-09 – 2023-05-10 (×2): 150 mg via ORAL
  Filled 2023-05-09 (×2): qty 1

## 2023-05-09 MED ORDER — LACTATED RINGERS IV SOLN: INTRAVENOUS | Status: DC

## 2023-05-09 MED ORDER — HYDROMORPHONE HCL 1 MG/ML IJ SOLN: 0.5000 mg | INTRAMUSCULAR | Status: DC | PRN

## 2023-05-09 MED ORDER — GLYCOPYRROLATE 0.2 MG/ML IJ SOLN
INTRAMUSCULAR | Status: AC
  Filled 2023-05-09: qty 1

## 2023-05-09 MED ORDER — OXYBUTYNIN CHLORIDE 5 MG PO TABS
5.0000 mg | ORAL_TABLET | Freq: Three times a day (TID) | ORAL | Status: DC | PRN
  Administered 2023-05-09 (×2): 5 mg via ORAL
  Filled 2023-05-09 (×2): qty 1

## 2023-05-09 MED ORDER — CHLORHEXIDINE GLUCONATE 0.12 % MT SOLN
15.0000 mL | Freq: Once | OROMUCOSAL | Status: AC
  Administered 2023-05-09: 15 mL via OROMUCOSAL

## 2023-05-09 MED ORDER — OXYCODONE-ACETAMINOPHEN 5-325 MG PO TABS: 1.0000 | ORAL_TABLET | ORAL | Status: DC | PRN

## 2023-05-09 MED ORDER — SODIUM CHLORIDE 0.9 % IV SOLN: INTRAVENOUS | Status: AC

## 2023-05-09 MED ORDER — 0.9 % SODIUM CHLORIDE (POUR BTL) OPTIME
TOPICAL | Status: DC | PRN
  Administered 2023-05-09: 1000 mL

## 2023-05-09 MED ORDER — STERILE WATER FOR IRRIGATION IR SOLN
Status: DC | PRN
  Administered 2023-05-09: 500 mL

## 2023-05-09 MED ORDER — AMLODIPINE BESYLATE 5 MG PO TABS: 5.0000 mg | ORAL_TABLET | Freq: Every day | ORAL | Status: DC

## 2023-05-09 MED ORDER — ATORVASTATIN CALCIUM 10 MG PO TABS
20.0000 mg | ORAL_TABLET | Freq: Every evening | ORAL | Status: DC
  Administered 2023-05-09: 20 mg via ORAL
  Filled 2023-05-09: qty 2

## 2023-05-09 MED ORDER — PHENAZOPYRIDINE HCL 200 MG PO TABS
200.0000 mg | ORAL_TABLET | Freq: Three times a day (TID) | ORAL | 0 refills | Status: AC | PRN
  Filled 2023-05-09: qty 30, 10d supply, fill #0

## 2023-05-09 MED ORDER — ONDANSETRON HCL 4 MG/2ML IJ SOLN: 4.0000 mg | INTRAMUSCULAR | Status: DC | PRN

## 2023-05-09 MED ORDER — PHENYLEPHRINE 80 MCG/ML (10ML) SYRINGE FOR IV PUSH (FOR BLOOD PRESSURE SUPPORT)
PREFILLED_SYRINGE | INTRAVENOUS | Status: DC | PRN
  Administered 2023-05-09: 160 ug via INTRAVENOUS
  Administered 2023-05-09 (×2): 80 ug via INTRAVENOUS
  Administered 2023-05-09: 160 ug via INTRAVENOUS
  Administered 2023-05-09 (×2): 80 ug via INTRAVENOUS

## 2023-05-09 MED ORDER — FENTANYL CITRATE PF 50 MCG/ML IJ SOSY: 25.0000 ug | PREFILLED_SYRINGE | INTRAMUSCULAR | Status: DC | PRN

## 2023-05-09 MED ORDER — LOSARTAN POTASSIUM 50 MG PO TABS
50.0000 mg | ORAL_TABLET | Freq: Every day | ORAL | Status: DC
  Filled 2023-05-09: qty 1

## 2023-05-09 MED ORDER — DEXAMETHASONE SODIUM PHOSPHATE 10 MG/ML IJ SOLN
INTRAMUSCULAR | Status: DC | PRN
  Administered 2023-05-09: 5 mg via INTRAVENOUS

## 2023-05-09 MED ORDER — DEXAMETHASONE SODIUM PHOSPHATE 10 MG/ML IJ SOLN
INTRAMUSCULAR | Status: AC
  Filled 2023-05-09: qty 1

## 2023-05-09 MED ORDER — LIDOCAINE HCL (PF) 2 % IJ SOLN
INTRAMUSCULAR | Status: AC
  Filled 2023-05-09: qty 5

## 2023-05-09 MED ORDER — ACETAMINOPHEN 325 MG PO TABS: 650.0000 mg | ORAL_TABLET | ORAL | Status: DC | PRN

## 2023-05-09 MED ORDER — METHYLPHENIDATE HCL ER 20 MG PO TBCR: 20.0000 mg | EXTENDED_RELEASE_TABLET | Freq: Every day | ORAL | Status: DC

## 2023-05-09 MED ORDER — VASOPRESSIN 20 UNIT/ML IV SOLN
INTRAVENOUS | Status: DC | PRN
  Administered 2023-05-09 (×4): 1 [IU] via INTRAVENOUS

## 2023-05-09 MED ORDER — EPHEDRINE SULFATE-NACL 50-0.9 MG/10ML-% IV SOSY
PREFILLED_SYRINGE | INTRAVENOUS | Status: DC | PRN
  Administered 2023-05-09: 15 mg via INTRAVENOUS
  Administered 2023-05-09 (×2): 10 mg via INTRAVENOUS

## 2023-05-09 MED ORDER — CEFAZOLIN SODIUM-DEXTROSE 2-4 GM/100ML-% IV SOLN
2.0000 g | INTRAVENOUS | Status: AC
  Administered 2023-05-09: 2 g via INTRAVENOUS
  Filled 2023-05-09: qty 100

## 2023-05-09 MED ORDER — DIPHENHYDRAMINE HCL 12.5 MG/5ML PO ELIX
12.5000 mg | ORAL_SOLUTION | Freq: Four times a day (QID) | ORAL | Status: DC | PRN
  Administered 2023-05-09: 12.5 mg via ORAL

## 2023-05-09 MED ORDER — GLYCOPYRROLATE 0.2 MG/ML IJ SOLN
INTRAMUSCULAR | Status: DC | PRN
  Administered 2023-05-09: .2 mg via INTRAVENOUS

## 2023-05-09 MED ORDER — TRAMADOL HCL 50 MG PO TABS
50.0000 mg | ORAL_TABLET | Freq: Four times a day (QID) | ORAL | 0 refills | Status: DC | PRN
  Filled 2023-05-09: qty 20, 5d supply, fill #0

## 2023-05-09 MED ORDER — GABAPENTIN 300 MG PO CAPS
300.0000 mg | ORAL_CAPSULE | Freq: Every day | ORAL | Status: DC | PRN
  Administered 2023-05-09: 300 mg via ORAL
  Filled 2023-05-09: qty 1

## 2023-05-09 MED ORDER — VASOPRESSIN 20 UNIT/ML IV SOLN
INTRAVENOUS | Status: AC
  Filled 2023-05-09: qty 1

## 2023-05-09 MED ORDER — OXYBUTYNIN CHLORIDE 5 MG PO TABS
5.0000 mg | ORAL_TABLET | Freq: Three times a day (TID) | ORAL | 1 refills | Status: AC | PRN
  Filled 2023-05-09 – 2023-08-21 (×2): qty 30, 10d supply, fill #0

## 2023-05-09 MED ORDER — CEFAZOLIN SODIUM-DEXTROSE 2-4 GM/100ML-% IV SOLN
2.0000 g | Freq: Three times a day (TID) | INTRAVENOUS | Status: DC
  Administered 2023-05-09 – 2023-05-10 (×3): 2 g via INTRAVENOUS
  Filled 2023-05-09 (×4): qty 100

## 2023-05-09 MED ORDER — SODIUM CHLORIDE 0.9 % IR SOLN
3000.0000 mL | Status: DC
  Administered 2023-05-09 (×2): 3000 mL

## 2023-05-09 MED ORDER — FENTANYL CITRATE (PF) 100 MCG/2ML IJ SOLN
INTRAMUSCULAR | Status: AC
  Filled 2023-05-09: qty 2

## 2023-05-09 MED ORDER — EZETIMIBE 10 MG PO TABS: 10.0000 mg | ORAL_TABLET | Freq: Every day | ORAL | Status: DC

## 2023-05-09 MED ORDER — SENNOSIDES-DOCUSATE SODIUM 8.6-50 MG PO TABS: 1.0000 | ORAL_TABLET | Freq: Every evening | ORAL | Status: DC | PRN

## 2023-05-09 MED ORDER — FAMOTIDINE 20 MG PO TABS: 20.0000 mg | ORAL_TABLET | Freq: Every day | ORAL | Status: DC | PRN

## 2023-05-09 MED ORDER — LIDOCAINE HCL (PF) 2 % IJ SOLN
INTRAMUSCULAR | Status: DC | PRN
  Administered 2023-05-09: 50 mg via INTRADERMAL

## 2023-05-09 MED ORDER — FENTANYL CITRATE (PF) 100 MCG/2ML IJ SOLN
INTRAMUSCULAR | Status: DC | PRN
  Administered 2023-05-09: 50 ug via INTRAVENOUS

## 2023-05-09 MED ORDER — ONDANSETRON HCL 4 MG/2ML IJ SOLN
INTRAMUSCULAR | Status: DC | PRN
  Administered 2023-05-09: 4 mg via INTRAVENOUS

## 2023-05-09 MED ORDER — ONDANSETRON HCL 4 MG/2ML IJ SOLN
INTRAMUSCULAR | Status: AC
  Filled 2023-05-09: qty 2

## 2023-05-09 MED ORDER — EPHEDRINE 5 MG/ML INJ
INTRAVENOUS | Status: AC
  Filled 2023-05-09: qty 5

## 2023-05-09 MED ORDER — EPHEDRINE 5 MG/ML INJ
INTRAVENOUS | Status: AC
Start: 2023-05-09 — End: ?
  Filled 2023-05-09: qty 5

## 2023-05-09 SURGICAL SUPPLY — 16 items
BAG URINE DRAIN 2000ML AR STRL (UROLOGICAL SUPPLIES) ×1 IMPLANT
BAG URO CATCHER STRL LF (MISCELLANEOUS) ×1 IMPLANT
CATH FOLEY 3WAY 30CC 22FR (CATHETERS) IMPLANT
DRAPE FOOT SWITCH (DRAPES) ×1 IMPLANT
GLOVE SURG LX STRL 8.0 MICRO (GLOVE) ×1 IMPLANT
GOWN STRL SURGICAL XL XLNG (GOWN DISPOSABLE) ×1 IMPLANT
HOLDER FOLEY CATH W/STRAP (MISCELLANEOUS) IMPLANT
KIT TURNOVER KIT A (KITS) IMPLANT
LOOP CUT BIPOLAR 24F LRG (ELECTROSURGICAL) IMPLANT
MANIFOLD NEPTUNE II (INSTRUMENTS) ×1 IMPLANT
PACK CYSTO (CUSTOM PROCEDURE TRAY) ×1 IMPLANT
PAD PREP 24X48 CUFFED NSTRL (MISCELLANEOUS) ×1 IMPLANT
SYR TOOMEY IRRIG 70ML (MISCELLANEOUS) ×1 IMPLANT
SYRINGE TOOMEY IRRIG 70ML (MISCELLANEOUS) ×1 IMPLANT
TUBING CONNECTING 10 (TUBING) ×1 IMPLANT
TUBING UROLOGY SET (TUBING) ×1 IMPLANT

## 2023-05-09 NOTE — Anesthesia Postprocedure Evaluation (Signed)
 Anesthesia Post Note  Patient: Joshua [name]  Procedure(s) Performed: TRANSURETHRAL RESECTION OF THE PROSTATE (TURP)     Patient location during evaluation: PACU Anesthesia Type: General Level of consciousness: awake and alert Pain management: pain level controlled Vital Signs Assessment: post-procedure vital signs reviewed and stable Respiratory status: spontaneous breathing, nonlabored ventilation, respiratory function stable and patient connected to nasal cannula oxygen Cardiovascular status: blood pressure returned to baseline and stable Postop Assessment: no apparent nausea or vomiting Anesthetic complications: no   No notable events documented.  Last Vitals:  Vitals:   05/09/23 1100 05/09/23 1204  BP: 102/62 113/70  Pulse: (!) 57 (!) 57  Resp: 14 20  Temp: (!) 36.4 C   SpO2: 95% 98%    Last Pain:  Vitals:   05/09/23 1205  TempSrc:   PainSc: 0-No pain                 Earl Lites P Catha Ontko

## 2023-05-09 NOTE — Plan of Care (Signed)

## 2023-05-09 NOTE — Anesthesia Preprocedure Evaluation (Addendum)
 Anesthesia Evaluation  Patient identified by MRN, date of birth, ID band Patient awake    Reviewed: Allergy & Precautions, NPO status , Patient's Chart, lab work & pertinent test results  Airway Mallampati: II  TM Distance: >3 FB Neck ROM: Full    Dental no notable dental hx.    Pulmonary sleep apnea    Pulmonary exam normal        Cardiovascular hypertension, Pt. on medications and Pt. on home beta blockers + Peripheral Vascular Disease (AAA 4.6cm, serial monitoring)   Rhythm:Regular Rate:Normal     Neuro/Psych   Anxiety Depression    negative neurological ROS     GI/Hepatic Neg liver ROS,GERD  ,,  Endo/Other    Renal/GU negative Renal ROS  negative genitourinary   Musculoskeletal  (+) Arthritis , Osteoarthritis,    Abdominal Normal abdominal exam  (+)   Peds  Hematology  (+) Blood dyscrasia, anemia   Anesthesia Other Findings   Reproductive/Obstetrics                             Anesthesia Physical Anesthesia Plan  ASA: 3  Anesthesia Plan: General   Post-op Pain Management:    Induction: Intravenous  PONV Risk Score and Plan: 2 and Ondansetron, Dexamethasone, Midazolam and Treatment may vary due to age or medical condition  Airway Management Planned: Mask and LMA  Additional Equipment: None  Intra-op Plan:   Post-operative Plan: Extubation in OR  Informed Consent: I have reviewed the patients History and Physical, chart, labs and discussed the procedure including the risks, benefits and alternatives for the proposed anesthesia with the patient or authorized representative who has indicated his/her understanding and acceptance.     Dental advisory given  Plan Discussed with: CRNA  Anesthesia Plan Comments:        Anesthesia Quick Evaluation

## 2023-05-09 NOTE — Anesthesia Procedure Notes (Signed)
 Procedure Name: LMA Insertion Date/Time: 05/09/2023 8:44 AM  Performed by: Cleda Clarks, CRNAPre-anesthesia Checklist: Patient identified, Emergency Drugs available, Suction available and Patient being monitored Patient Re-evaluated:Patient Re-evaluated prior to induction Oxygen Delivery Method: Circle system utilized Preoxygenation: Pre-oxygenation with 100% oxygen Induction Type: IV induction Ventilation: Mask ventilation without difficulty LMA: LMA inserted LMA Size: 4.0 Number of attempts: 1 Placement Confirmation: positive ETCO2 Tube secured with: Tape Dental Injury: Teeth and Oropharynx as per pre-operative assessment

## 2023-05-09 NOTE — Transfer of Care (Signed)
 Immediate Anesthesia Transfer of Care Note  Patient: Joshua [name]  Procedure(s) Performed: TRANSURETHRAL RESECTION OF THE PROSTATE (TURP)  Patient Location: PACU  Anesthesia Type:General  Level of Consciousness: awake, alert , and oriented  Airway & Oxygen Therapy: Patient Spontanous Breathing and Patient connected to face mask oxygen  Post-op Assessment: Report given to RN and Post -op Vital signs reviewed and stable  Post vital signs: Reviewed and stable  Last Vitals:  Vitals Value Taken Time  BP 109/62 05/09/23 0951  Temp    Pulse 48 05/09/23 0953  Resp 12 05/09/23 0953  SpO2 95 % 05/09/23 0953  Vitals shown include unfiled device data.  Last Pain:  Vitals:   05/09/23 0650  TempSrc: Oral         Complications: No notable events documented.

## 2023-05-09 NOTE — Op Note (Signed)
 Operative Note  Preoperative diagnosis:  1.  BPH with bladder outlet obstruction  Postoperative diagnosis: 1.  BPH with bladder outlet obstruction  Procedure(s): 1.  Bipolar TURP 2.  Urethral dilation  Surgeon: Rhoderick Moody, MD  Assistants:  None  Anesthesia:  General  Complications:  None  EBL:  20 mL   Specimens: 1. Prostate chips  Drains/Catheters: 1.  22 French three-way Foley catheter with 30 mL in the balloon  Intraoperative findings:   Needle stenosis Bilobar prostatic urethral obstruction  Indication:  Joshua [name] is a 73 y.o. male with ongoing BPH with LUTS despite tamsulosin twice daily.  He has been consented for the above procedures, voices understanding and wishes to proceed.  Description of procedure:  After informed consent was obtained, the patient was brought to the operating room and general anesthesia was administered. The patient was then placed in the dorsolithotomy position and prepped and draped in usual sterile fashion. A timeout was performed. A 23 French rigid cystoscope was then inserted into the urethral meatus and advanced into the bladder under direct vision. A complete bladder survey revealed no intravesical pathology.  Both ureteral orifices were identified and well away from the bladder neck.  The rigid cystoscope was then removed.  I attempted to place the 26 French resectoscope, but the patient's urethral meatus was too stenotic.  I then dilated his urethral meatus with Sissy Hoff sounds starting at 20 [place] and progressing up to 26 [place], and 2 [place] increments.  This allowed easy passage of the 31 French resectoscope with a bipolar loop working element.  Starting at the bladder neck and progressing distally to the verumontanum, the prostatic adenoma was systematically resected until a widely patent prostatic urethral channel was created.  All prostate chips were then hand irrigated out of the bladder and sent to pathology for  permanent section.  The resectoscope was then removed and exchanged for a 22 French three-way Foley catheter.  The three-way Foley catheter was then extensively hand irrigated until the irrigant returned clear to light pink.  The catheter was then placed to continuous bladder irrigation and placed on rubber band traction.  He tolerated the procedure well and was transferred to the postanesthesia unit in stable condition.  Plan:  CBI overnight

## 2023-05-09 NOTE — Progress Notes (Signed)
   05/09/23 2154  BiPAP/CPAP/SIPAP  $ Non-Invasive Home Ventilator  Initial  BiPAP/CPAP/SIPAP Pt Type Adult  BiPAP/CPAP/SIPAP Resmed  Mask Type Full face mask  Dentures removed? Not applicable  Respiratory Rate 20 breaths/min  FiO2 (%) 21 %  Patient Home Machine No  Patient Home Mask Yes  Patient Home Tubing Yes  Auto Titrate Yes (AUTOMODE, MIN6CM, MAX16CM PER PT)  Minimum cmH2O 6 cmH2O  Maximum cmH2O 16 cmH2O  CPAP/SIPAP surface wiped down Yes  Device Plugged into RED Power Outlet Yes  BiPAP/CPAP /SiPAP Vitals  Pulse Rate 62  Resp 20  SpO2 97 %

## 2023-05-10 ENCOUNTER — Encounter (HOSPITAL_COMMUNITY): Payer: Self-pay | Admitting: Urology

## 2023-05-10 ENCOUNTER — Other Ambulatory Visit (HOSPITAL_COMMUNITY): Payer: Self-pay

## 2023-05-10 DIAGNOSIS — N401 Enlarged prostate with lower urinary tract symptoms: Secondary | ICD-10-CM | POA: Diagnosis not present

## 2023-05-10 LAB — SURGICAL PATHOLOGY

## 2023-05-10 NOTE — Discharge Summary (Signed)
 Date of admission: 05/09/2023  Date of discharge: 05/10/2023  Admission diagnosis: BPH with LUTS  Discharge diagnosis: Same  Procedures: TURP  History and Physical: For full details, please see admission history and physical. Briefly, Joshua [name] is a 73 y.o. year old patient with BPH with LUTS.   Hospital Course: Routine post-op course following TURP  Physical Exam:  General: Alert and oriented CV: RRR, palpable distal pulses Lungs: CTAB, equal chest rise Abdomen: Soft, NTND, no rebound or guarding GU:  Foley draining clear yellow urine w/o CBI Ext: NT, No erythema  Laboratory values:  Recent Labs    05/07/23 0941  HGB 14.3  HCT 44.3   Recent Labs    05/07/23 0941  CREATININE 1.12    Disposition: Home  Discharge instruction: The patient was instructed to be ambulatory but told to refrain from heavy lifting, strenuous activity, or driving.  Discharge medications:  Allergies as of 05/10/2023   No Known Allergies      Medication List     TAKE these medications    acetaminophen 650 MG CR tablet Commonly known as: TYLENOL Take 1,300 mg by mouth every 8 (eight) hours as needed for pain.   amLODipine 5 MG tablet Commonly known as: NORVASC Take 1 tablet (5 mg total) by mouth daily.   amLODipine 5 MG tablet Commonly known as: NORVASC Take 1 tablet (5 mg total) by mouth daily.   amLODipine 5 MG tablet Commonly known as: NORVASC Take 1 tablet (5 mg total) by mouth daily.   ammonium lactate 12 % lotion Commonly known as: LAC-HYDRIN Apply 1 Application topically 2 (two) times daily to dry and cracked skin   amoxicillin 500 MG capsule Commonly known as: AMOXIL Take 4 capsules (2,000 mg total) by mouth 1 hour prior to procedure.   b complex vitamins capsule Take 1 capsule by mouth every other day.   B-D 3CC LUER-LOK SYR 18GX1-1/2 18G X 1-1/2" 3 ML Misc Generic drug: SYRINGE-NEEDLE (DISP) 3 ML Use as directed with Testosterone   buPROPion 150 MG 12 hr  tablet Commonly known as: WELLBUTRIN SR Take 1 tablet (150 mg total) by mouth 2 (two) times daily.   buPROPion 150 MG 12 hr tablet Commonly known as: WELLBUTRIN SR Take 1 tablet (150 mg total) by mouth 2 (two) times daily.   buPROPion 150 MG 12 hr tablet Commonly known as: WELLBUTRIN SR Take 1 tablet (150 mg total) by mouth 2 (two) times daily.   CALCIUM 600 + D PO Take 1 tablet by mouth every other day.   doxepin 10 MG/ML solution Commonly known as: SINEQUAN Take 0.3 mLs (3 mg total) by mouth at bedtime as needed for sleep.   ezetimibe 10 MG tablet Commonly known as: ZETIA Take 1 tablet (10 mg total) by mouth daily.   ezetimibe 10 MG tablet Commonly known as: ZETIA Take 1 tablet (10 mg total) by mouth daily.   ezetimibe 10 MG tablet Commonly known as: ZETIA Take 1 tablet (10 mg total) by mouth daily.   famciclovir 125 MG tablet Commonly known as: FAMVIR Take 1 tablet by mouth 2 times daily. What changed:  when to take this reasons to take this   famotidine 20 MG tablet Commonly known as: PEPCID TAKE 1 TABLET BY MOUTH ONCE DAILY AS NEEDED FOR HEART BURN   famotidine 20 MG tablet Commonly known as: PEPCID Take 1 tablet (20 mg total) by mouth daily as needed for heart burn   gabapentin 300 MG capsule Commonly  known as: NEURONTIN Take 1 capsule (300 mg total) by mouth daily as needed for back pain   gabapentin 300 MG capsule Commonly known as: NEURONTIN Take 1 capsule (300 mg total) by mouth daily as needed for back pain.   gabapentin 300 MG capsule Commonly known as: NEURONTIN Take 1 capsule (300 mg total) by mouth daily as needed for back pain.   gabapentin 300 MG capsule Commonly known as: NEURONTIN Take 1 capsule (300 mg total) by mouth daily as needed as directed   ibuprofen 200 MG tablet Commonly known as: ADVIL Take 200 mg by mouth every 6 (six) hours as needed for moderate pain (pain score 4-6).   losartan 50 MG tablet Commonly known as:  COZAAR Take 1 tablet (50 mg total) by mouth daily.   losartan 50 MG tablet Commonly known as: COZAAR Take 1 tablet (50 mg total) by mouth daily.   losartan 50 MG tablet Commonly known as: COZAAR Take 1 tablet (50 mg total) by mouth daily.   methocarbamol 500 MG tablet Commonly known as: ROBAXIN Take 1 tablet (500 mg) by mouth 3 times daily as needed for spasms.   methylphenidate 20 MG ER tablet Commonly known as: METADATE ER Take 1 tablet (20 mg total) by mouth daily. What changed: Another medication with the same name was changed. Make sure you understand how and when to take each.   methylphenidate 20 MG ER tablet Commonly known as: METADATE ER Take 1 tablet (20 mg total) by mouth daily as needed. What changed: Another medication with the same name was changed. Make sure you understand how and when to take each.   methylphenidate 20 MG ER tablet Commonly known as: METADATE ER Take 1 tablet (20 mg total) by mouth daily as needed. What changed: Another medication with the same name was changed. Make sure you understand how and when to take each.   methylphenidate 20 MG ER tablet Commonly known as: METADATE ER Take 1 tablet (20 mg total) by mouth daily as needed. What changed: Another medication with the same name was changed. Make sure you understand how and when to take each.   methylphenidate 20 MG ER tablet Commonly known as: METADATE ER Take 1 tablet (20 mg total) by mouth daily as needed. What changed: Another medication with the same name was changed. Make sure you understand how and when to take each.   methylphenidate 20 MG ER tablet Commonly known as: METADATE ER Take 1 tablet (20 mg total) by mouth daily. What changed: Another medication with the same name was changed. Make sure you understand how and when to take each.   methylphenidate 20 MG ER tablet Commonly known as: METADATE ER Take 1 tablet (20 mg total) by mouth daily. What changed: Another medication  with the same name was changed. Make sure you understand how and when to take each.   methylphenidate 20 MG ER tablet Commonly known as: METADATE ER Take 1 tablet (20 mg total) by mouth daily. What changed: Another medication with the same name was changed. Make sure you understand how and when to take each.   methylphenidate 20 MG ER tablet Commonly known as: METADATE ER Take 1 tablet (20 mg total) by mouth daily. What changed:  when to take this reasons to take this   methylphenidate 20 MG ER tablet Commonly known as: METADATE ER Take 1 tablet (20 mg total) by mouth daily. What changed: Another medication with the same name was changed. Make sure you  understand how and when to take each.   metoprolol succinate 25 MG 24 hr tablet Commonly known as: TOPROL-XL Take 1 tablet (25 mg total) by mouth daily.   metoprolol succinate 25 MG 24 hr tablet Commonly known as: TOPROL-XL Take 1 tablet (25 mg total) by mouth daily.   metoprolol succinate 25 MG 24 hr tablet Commonly known as: TOPROL-XL Take 1 tablet (25 mg total) by mouth daily.   MULTIVITAMINS PO Take 1 tablet by mouth every other day.   ondansetron 4 MG tablet Commonly known as: ZOFRAN Take 1 tablet (4 mg) by mouth every 6 hours as needed for nausea.   oxybutynin 5 MG tablet Commonly known as: DITROPAN Take 1 tablet (5 mg total) by mouth every 8 (eight) hours as needed for bladder spasms.   oxyCODONE 5 MG immediate release tablet Commonly known as: Oxy IR/ROXICODONE Take 1 tablet (5 mg) by mouth every 6 hours as needed for pain.   phenazopyridine 200 MG tablet Commonly known as: Pyridium Take 1 tablet (200 mg total) by mouth 3 (three) times daily as needed (for pain with urination).   simvastatin 40 MG tablet Commonly known as: ZOCOR Take 1 tablet (40 mg total) by mouth every evening.   simvastatin 40 MG tablet Commonly known as: ZOCOR Take 1 tablet (40 mg total) by mouth every evening.   tamsulosin 0.4 MG  Caps capsule Commonly known as: FLOMAX Take 1 capsule (0.4 mg total) by mouth daily.   tamsulosin 0.4 MG Caps capsule Commonly known as: FLOMAX Take 1 capsule (0.4 mg) by mouth daily   tamsulosin 0.4 MG Caps capsule Commonly known as: FLOMAX Take 1 capsule (0.4 mg total) by mouth daily.   tamsulosin 0.4 MG Caps capsule Commonly known as: FLOMAX Take 1 capsule (0.4 mg total) by mouth 2 (two) times daily.   testosterone cypionate 200 MG/ML injection Commonly known as: DEPOTESTOSTERONE CYPIONATE Inject 1 mL (200 mg total) into the muscle every 21 ( twenty-one) days.   testosterone cypionate 200 MG/ML injection Commonly known as: DEPOTESTOSTERONE CYPIONATE Inject 1 mL (200 mg total) into the muscle every 3 weeks.   traMADol 50 MG tablet Commonly known as: Ultram Take 1 tablet (50 mg total) by mouth every 6 (six) hours as needed for up to 5 days. What changed:  how much to take reasons to take this   Vitamin D3 Ultra Strength 125 MCG (5000 UT) capsule Generic drug: Cholecalciferol Take 5,000 Units by mouth every other day.        Followup:   Follow-up Information     [facility] Follow up on 05/14/2023.   Why: Postop appointment at 8:30 AM for catheter removal Contact information: [address] [place] [address] [phone]

## 2023-05-10 NOTE — Progress Notes (Signed)
 Late entry for 1pm -   Discharge instructions reviewed with patient and spouse. All questions answered. All belongings accounted for. Patient to follow up with MD Monday for foley removal. Foley teaching completed. Changed over to leg bag. Teach back completed.   Patient medications hand delivered from outpatient pharmacy. PIV removed. Assisted via WC to private vehicle.

## 2023-05-10 NOTE — Progress Notes (Signed)
   05/10/23 0839  TOC Brief Assessment  Insurance and Status Reviewed  Patient has primary care physician Yes  Home environment has been reviewed Resides in single family home with spouse  Prior level of function: Independent with ADLs at baseline  Prior/Current Home Services No current home services  Social Drivers of Health Review SDOH reviewed no interventions necessary  Readmission risk has been reviewed Yes  Transition of care needs no transition of care needs at this time

## 2023-05-10 NOTE — Care Management Obs Status (Signed)
 MEDICARE OBSERVATION STATUS NOTIFICATION   Patient Details  Name: Joshua [name] MRN: [id number] Date of Birth: 1951/01/14   Medicare Observation Status Notification Given:  Yes    Ewing Schlein, LCSW 05/10/2023, 9:26 AM

## 2023-05-10 NOTE — Progress Notes (Signed)
 Mobility Specialist - Progress Note   05/10/23 1049  Mobility  Activity Ambulated independently in hallway  Level of Assistance Independent  Assistive Device None  Distance Ambulated (ft) 500 ft  Activity Response Tolerated well  Mobility Referral Yes  Mobility visit 1 Mobility  Mobility Specialist Start Time (ACUTE ONLY) 1031  Mobility Specialist Stop Time (ACUTE ONLY) 1048  Mobility Specialist Time Calculation (min) (ACUTE ONLY) 17 min   Pt received in bed and agreeable to mobility. No complaints during session. Pt to bed after session with all needs met.    [facility]

## 2023-05-11 ENCOUNTER — Other Ambulatory Visit (HOSPITAL_COMMUNITY): Payer: Self-pay

## 2023-05-14 DIAGNOSIS — R3912 Poor urinary stream: Secondary | ICD-10-CM | POA: Diagnosis not present

## 2023-05-18 ENCOUNTER — Other Ambulatory Visit (HOSPITAL_COMMUNITY): Payer: Self-pay

## 2023-05-19 ENCOUNTER — Other Ambulatory Visit (HOSPITAL_COMMUNITY): Payer: Self-pay

## 2023-05-19 MED ORDER — TESTOSTERONE CYPIONATE 200 MG/ML IM SOLN
200.0000 mg | INTRAMUSCULAR | 0 refills | Status: AC
Start: 2023-05-18 — End: ?
  Filled 2023-05-19: qty 4, 84d supply, fill #0

## 2023-05-22 ENCOUNTER — Other Ambulatory Visit (HOSPITAL_COMMUNITY): Payer: Self-pay

## 2023-05-22 ENCOUNTER — Other Ambulatory Visit: Payer: Self-pay

## 2023-05-23 ENCOUNTER — Other Ambulatory Visit (HOSPITAL_COMMUNITY): Payer: Self-pay

## 2023-05-23 ENCOUNTER — Other Ambulatory Visit: Payer: Self-pay

## 2023-05-28 ENCOUNTER — Other Ambulatory Visit: Payer: Self-pay

## 2023-05-28 ENCOUNTER — Other Ambulatory Visit (HOSPITAL_COMMUNITY): Payer: Self-pay

## 2023-05-28 MED ORDER — LOSARTAN POTASSIUM 50 MG PO TABS
50.0000 mg | ORAL_TABLET | Freq: Every day | ORAL | 3 refills | Status: AC
  Filled 2023-05-28: qty 90, 90d supply, fill #0
  Filled 2023-08-21: qty 90, 90d supply, fill #1
  Filled 2023-08-21 – 2023-11-18 (×2): qty 90, 90d supply, fill #0
  Filled 2024-02-16: qty 90, 90d supply, fill #1

## 2023-05-29 ENCOUNTER — Other Ambulatory Visit: Payer: Self-pay

## 2023-06-02 ENCOUNTER — Other Ambulatory Visit (HOSPITAL_COMMUNITY): Payer: Self-pay

## 2023-06-04 ENCOUNTER — Other Ambulatory Visit (HOSPITAL_COMMUNITY): Payer: Self-pay

## 2023-06-04 ENCOUNTER — Other Ambulatory Visit: Payer: Self-pay

## 2023-06-04 MED ORDER — METHYLPHENIDATE HCL ER 20 MG PO TBCR
20.0000 mg | EXTENDED_RELEASE_TABLET | Freq: Every day | ORAL | 0 refills | Status: AC
  Filled 2023-06-04 (×2): qty 30, 30d supply, fill #0

## 2023-06-05 ENCOUNTER — Other Ambulatory Visit (HOSPITAL_COMMUNITY): Payer: Self-pay

## 2023-06-07 ENCOUNTER — Other Ambulatory Visit (HOSPITAL_COMMUNITY): Payer: Self-pay

## 2023-06-08 ENCOUNTER — Other Ambulatory Visit (HOSPITAL_COMMUNITY): Payer: Self-pay

## 2023-06-09 ENCOUNTER — Other Ambulatory Visit (HOSPITAL_COMMUNITY): Payer: Self-pay

## 2023-06-13 ENCOUNTER — Other Ambulatory Visit (HOSPITAL_COMMUNITY): Payer: Self-pay

## 2023-06-13 DIAGNOSIS — R350 Frequency of micturition: Secondary | ICD-10-CM | POA: Diagnosis not present

## 2023-06-14 ENCOUNTER — Other Ambulatory Visit (HOSPITAL_COMMUNITY): Payer: Self-pay

## 2023-06-18 ENCOUNTER — Other Ambulatory Visit (HOSPITAL_COMMUNITY): Payer: Self-pay

## 2023-06-20 ENCOUNTER — Other Ambulatory Visit (HOSPITAL_COMMUNITY): Payer: Self-pay

## 2023-06-21 ENCOUNTER — Other Ambulatory Visit (HOSPITAL_COMMUNITY): Payer: Self-pay

## 2023-07-05 ENCOUNTER — Other Ambulatory Visit (HOSPITAL_COMMUNITY): Payer: Self-pay

## 2023-07-12 ENCOUNTER — Other Ambulatory Visit (HOSPITAL_COMMUNITY): Payer: Self-pay

## 2023-07-12 MED ORDER — METHYLPHENIDATE HCL ER 20 MG PO TBCR
20.0000 mg | EXTENDED_RELEASE_TABLET | Freq: Every day | ORAL | 0 refills | Status: AC
  Filled 2023-07-12 – 2023-07-13 (×2): qty 30, 30d supply, fill #0

## 2023-07-13 ENCOUNTER — Other Ambulatory Visit (HOSPITAL_COMMUNITY): Payer: Self-pay

## 2023-07-17 ENCOUNTER — Other Ambulatory Visit (HOSPITAL_COMMUNITY): Payer: Self-pay

## 2023-07-17 DIAGNOSIS — I10 Essential (primary) hypertension: Secondary | ICD-10-CM | POA: Diagnosis not present

## 2023-07-17 DIAGNOSIS — I7143 Infrarenal abdominal aortic aneurysm, without rupture: Secondary | ICD-10-CM | POA: Diagnosis not present

## 2023-07-17 DIAGNOSIS — G4733 Obstructive sleep apnea (adult) (pediatric): Secondary | ICD-10-CM | POA: Diagnosis not present

## 2023-07-17 DIAGNOSIS — R42 Dizziness and giddiness: Secondary | ICD-10-CM | POA: Diagnosis not present

## 2023-07-17 DIAGNOSIS — R0989 Other specified symptoms and signs involving the circulatory and respiratory systems: Secondary | ICD-10-CM | POA: Diagnosis not present

## 2023-07-18 ENCOUNTER — Other Ambulatory Visit: Payer: Self-pay | Admitting: Internal Medicine

## 2023-07-18 DIAGNOSIS — R0989 Other specified symptoms and signs involving the circulatory and respiratory systems: Secondary | ICD-10-CM

## 2023-07-18 DIAGNOSIS — R42 Dizziness and giddiness: Secondary | ICD-10-CM

## 2023-07-19 DIAGNOSIS — H02413 Mechanical ptosis of bilateral eyelids: Secondary | ICD-10-CM | POA: Diagnosis not present

## 2023-07-23 ENCOUNTER — Other Ambulatory Visit: Payer: Self-pay

## 2023-07-24 ENCOUNTER — Ambulatory Visit
Admission: RE | Admit: 2023-07-24 | Discharge: 2023-07-24 | Disposition: A | Discharge status: Home / Self Care | Source: Ambulatory Visit | Attending: Internal Medicine | Admitting: Internal Medicine

## 2023-07-24 DIAGNOSIS — R42 Dizziness and giddiness: Secondary | ICD-10-CM

## 2023-07-26 DIAGNOSIS — M25522 Pain in left elbow: Secondary | ICD-10-CM | POA: Diagnosis not present

## 2023-07-27 ENCOUNTER — Other Ambulatory Visit (HOSPITAL_COMMUNITY): Payer: Self-pay

## 2023-07-27 ENCOUNTER — Other Ambulatory Visit: Payer: Self-pay

## 2023-07-27 MED ORDER — MELOXICAM 7.5 MG PO TABS
7.5000 mg | ORAL_TABLET | Freq: Every day | ORAL | 0 refills | Status: AC
Start: 2023-07-26 — End: ?
  Filled 2023-07-27: qty 10, 10d supply, fill #0

## 2023-07-30 ENCOUNTER — Other Ambulatory Visit (HOSPITAL_COMMUNITY): Payer: Self-pay

## 2023-08-02 DIAGNOSIS — M7022 Olecranon bursitis, left elbow: Secondary | ICD-10-CM | POA: Diagnosis not present

## 2023-08-06 ENCOUNTER — Ambulatory Visit
Admission: RE | Admit: 2023-08-06 | Discharge: 2023-08-06 | Disposition: A | Discharge status: Home / Self Care | Source: Ambulatory Visit | Attending: Internal Medicine | Admitting: Internal Medicine

## 2023-08-06 DIAGNOSIS — R0989 Other specified symptoms and signs involving the circulatory and respiratory systems: Secondary | ICD-10-CM

## 2023-08-06 DIAGNOSIS — I6523 Occlusion and stenosis of bilateral carotid arteries: Secondary | ICD-10-CM | POA: Diagnosis not present

## 2023-08-09 ENCOUNTER — Other Ambulatory Visit: Payer: Self-pay

## 2023-08-09 ENCOUNTER — Other Ambulatory Visit (HOSPITAL_COMMUNITY): Payer: Self-pay

## 2023-08-09 MED ORDER — METHYLPHENIDATE HCL ER 20 MG PO TBCR
20.0000 mg | EXTENDED_RELEASE_TABLET | Freq: Every day | ORAL | 0 refills | Status: AC | PRN
  Filled 2023-08-09: qty 30, 30d supply, fill #0

## 2023-08-13 ENCOUNTER — Other Ambulatory Visit: Payer: Self-pay

## 2023-08-13 ENCOUNTER — Other Ambulatory Visit (HOSPITAL_COMMUNITY): Payer: Self-pay

## 2023-08-13 MED ORDER — FAMOTIDINE 20 MG PO TABS
20.0000 mg | ORAL_TABLET | Freq: Every day | ORAL | 4 refills | Status: AC | PRN
  Filled 2023-08-13 – 2023-08-21 (×2): qty 90, 90d supply, fill #0

## 2023-08-15 DIAGNOSIS — N401 Enlarged prostate with lower urinary tract symptoms: Secondary | ICD-10-CM | POA: Diagnosis not present

## 2023-08-15 DIAGNOSIS — N3946 Mixed incontinence: Secondary | ICD-10-CM | POA: Diagnosis not present

## 2023-08-21 ENCOUNTER — Other Ambulatory Visit (HOSPITAL_BASED_OUTPATIENT_CLINIC_OR_DEPARTMENT_OTHER): Payer: Self-pay

## 2023-08-21 ENCOUNTER — Other Ambulatory Visit (HOSPITAL_COMMUNITY): Payer: Self-pay

## 2023-08-22 DIAGNOSIS — R42 Dizziness and giddiness: Secondary | ICD-10-CM | POA: Diagnosis not present

## 2023-08-22 DIAGNOSIS — M25522 Pain in left elbow: Secondary | ICD-10-CM | POA: Diagnosis not present

## 2023-08-24 ENCOUNTER — Other Ambulatory Visit (HOSPITAL_COMMUNITY): Payer: Self-pay

## 2023-08-29 DIAGNOSIS — R42 Dizziness and giddiness: Secondary | ICD-10-CM | POA: Diagnosis not present

## 2023-08-29 DIAGNOSIS — M7022 Olecranon bursitis, left elbow: Secondary | ICD-10-CM | POA: Diagnosis not present

## 2023-08-29 DIAGNOSIS — M25522 Pain in left elbow: Secondary | ICD-10-CM | POA: Diagnosis not present

## 2023-09-03 ENCOUNTER — Other Ambulatory Visit: Payer: Self-pay

## 2023-09-05 DIAGNOSIS — M25522 Pain in left elbow: Secondary | ICD-10-CM | POA: Diagnosis not present

## 2023-09-05 DIAGNOSIS — R42 Dizziness and giddiness: Secondary | ICD-10-CM | POA: Diagnosis not present

## 2023-09-06 ENCOUNTER — Other Ambulatory Visit (HOSPITAL_COMMUNITY): Payer: Self-pay

## 2023-09-07 ENCOUNTER — Other Ambulatory Visit (HOSPITAL_COMMUNITY): Payer: Self-pay

## 2023-09-07 MED ORDER — METHYLPHENIDATE HCL ER 20 MG PO TBCR
20.0000 mg | EXTENDED_RELEASE_TABLET | Freq: Every day | ORAL | 0 refills | Status: DC
  Filled 2023-09-07: qty 30, 30d supply, fill #0

## 2023-09-12 DIAGNOSIS — M25522 Pain in left elbow: Secondary | ICD-10-CM | POA: Diagnosis not present

## 2023-09-12 DIAGNOSIS — R42 Dizziness and giddiness: Secondary | ICD-10-CM | POA: Diagnosis not present

## 2023-09-20 ENCOUNTER — Other Ambulatory Visit (HOSPITAL_COMMUNITY): Payer: Self-pay

## 2023-09-20 DIAGNOSIS — G4733 Obstructive sleep apnea (adult) (pediatric): Secondary | ICD-10-CM | POA: Diagnosis not present

## 2023-09-26 DIAGNOSIS — R42 Dizziness and giddiness: Secondary | ICD-10-CM | POA: Diagnosis not present

## 2023-09-26 DIAGNOSIS — M25522 Pain in left elbow: Secondary | ICD-10-CM | POA: Diagnosis not present

## 2023-10-01 ENCOUNTER — Other Ambulatory Visit (HOSPITAL_COMMUNITY): Payer: Self-pay

## 2023-10-01 MED ORDER — METHYLPHENIDATE HCL ER 20 MG PO TBCR
20.0000 mg | EXTENDED_RELEASE_TABLET | Freq: Every day | ORAL | 0 refills | Status: AC
  Filled 2023-10-05: qty 30, 30d supply, fill #0

## 2023-10-05 ENCOUNTER — Other Ambulatory Visit: Payer: Self-pay

## 2023-10-05 ENCOUNTER — Other Ambulatory Visit (HOSPITAL_COMMUNITY): Payer: Self-pay

## 2023-10-11 DIAGNOSIS — E291 Testicular hypofunction: Secondary | ICD-10-CM | POA: Diagnosis not present

## 2023-10-20 ENCOUNTER — Other Ambulatory Visit (HOSPITAL_COMMUNITY): Payer: Self-pay

## 2023-10-21 ENCOUNTER — Other Ambulatory Visit (HOSPITAL_COMMUNITY): Payer: Self-pay

## 2023-10-23 ENCOUNTER — Other Ambulatory Visit (HOSPITAL_COMMUNITY): Payer: Self-pay

## 2023-10-23 ENCOUNTER — Other Ambulatory Visit: Payer: Self-pay

## 2023-10-23 MED ORDER — TAMSULOSIN HCL 0.4 MG PO CAPS
0.4000 mg | ORAL_CAPSULE | Freq: Two times a day (BID) | ORAL | 3 refills | Status: AC
  Filled 2023-10-23: qty 180, 90d supply, fill #0

## 2023-10-30 ENCOUNTER — Other Ambulatory Visit (HOSPITAL_COMMUNITY): Payer: Self-pay

## 2023-10-30 MED ORDER — METHYLPHENIDATE HCL ER 20 MG PO TBCR
20.0000 mg | EXTENDED_RELEASE_TABLET | Freq: Every day | ORAL | 0 refills | Status: AC | PRN
  Filled 2023-11-02: qty 30, 30d supply, fill #0

## 2023-10-31 ENCOUNTER — Other Ambulatory Visit: Payer: Self-pay

## 2023-10-31 ENCOUNTER — Other Ambulatory Visit (HOSPITAL_COMMUNITY): Payer: Self-pay

## 2023-11-01 DIAGNOSIS — E291 Testicular hypofunction: Secondary | ICD-10-CM | POA: Diagnosis not present

## 2023-11-01 DIAGNOSIS — R42 Dizziness and giddiness: Secondary | ICD-10-CM | POA: Diagnosis not present

## 2023-11-01 DIAGNOSIS — M25522 Pain in left elbow: Secondary | ICD-10-CM | POA: Diagnosis not present

## 2023-11-02 ENCOUNTER — Other Ambulatory Visit: Payer: Self-pay

## 2023-11-02 ENCOUNTER — Other Ambulatory Visit (HOSPITAL_COMMUNITY): Payer: Self-pay

## 2023-11-06 ENCOUNTER — Other Ambulatory Visit (HOSPITAL_COMMUNITY): Payer: Self-pay

## 2023-11-06 ENCOUNTER — Other Ambulatory Visit: Payer: Self-pay

## 2023-11-08 ENCOUNTER — Telehealth: Payer: Self-pay

## 2023-11-08 NOTE — Telephone Encounter (Signed)
 Patient LVM on sleep lab phone asking for a call back to discuss having a prescription for a CPAP machine.

## 2023-11-08 NOTE — Telephone Encounter (Signed)
 That patient has not seen us  in 2.5 years. Please give him a call and schedule him with either Amy NP or Dr Chalice for a visit. We are unable to provide any prescriptions without recent visit. If his current machine is malfunctioning he can contact his DME company to inquire about one to loan until he could get a new machine.

## 2023-11-10 ENCOUNTER — Other Ambulatory Visit (HOSPITAL_COMMUNITY): Payer: Self-pay

## 2023-11-13 NOTE — Telephone Encounter (Signed)
 thanks

## 2023-11-18 ENCOUNTER — Other Ambulatory Visit (HOSPITAL_COMMUNITY): Payer: Self-pay

## 2023-11-20 DIAGNOSIS — M25522 Pain in left elbow: Secondary | ICD-10-CM | POA: Diagnosis not present

## 2023-11-20 DIAGNOSIS — R42 Dizziness and giddiness: Secondary | ICD-10-CM | POA: Diagnosis not present

## 2023-11-25 ENCOUNTER — Other Ambulatory Visit (HOSPITAL_COMMUNITY): Payer: Self-pay

## 2023-12-04 ENCOUNTER — Other Ambulatory Visit: Payer: Self-pay

## 2023-12-04 ENCOUNTER — Other Ambulatory Visit (HOSPITAL_COMMUNITY): Payer: Self-pay

## 2023-12-04 MED ORDER — DOXEPIN HCL 10 MG/ML PO CONC
ORAL | 3 refills | Status: AC
  Filled 2023-12-04: qty 30, 100d supply, fill #0
  Filled 2024-03-07: qty 30, 100d supply, fill #1

## 2023-12-18 ENCOUNTER — Other Ambulatory Visit (HOSPITAL_COMMUNITY): Payer: Self-pay

## 2023-12-18 ENCOUNTER — Other Ambulatory Visit: Payer: Self-pay

## 2023-12-18 MED ORDER — TESTOSTERONE CYPIONATE 200 MG/ML IM SOLN
200.0000 mg | INTRAMUSCULAR | 0 refills | Status: DC
  Filled 2023-12-18: qty 4, 84d supply, fill #0

## 2023-12-20 DIAGNOSIS — E291 Testicular hypofunction: Secondary | ICD-10-CM | POA: Diagnosis not present

## 2023-12-26 ENCOUNTER — Other Ambulatory Visit (HOSPITAL_COMMUNITY): Payer: Self-pay

## 2023-12-27 ENCOUNTER — Other Ambulatory Visit (HOSPITAL_BASED_OUTPATIENT_CLINIC_OR_DEPARTMENT_OTHER): Payer: Self-pay

## 2023-12-27 ENCOUNTER — Other Ambulatory Visit (HOSPITAL_COMMUNITY): Payer: Self-pay

## 2023-12-27 DIAGNOSIS — M25522 Pain in left elbow: Secondary | ICD-10-CM | POA: Diagnosis not present

## 2023-12-27 DIAGNOSIS — R42 Dizziness and giddiness: Secondary | ICD-10-CM | POA: Diagnosis not present

## 2023-12-27 MED ORDER — METHYLPHENIDATE HCL ER 20 MG PO TBCR
20.0000 mg | EXTENDED_RELEASE_TABLET | Freq: Every day | ORAL | 0 refills | Status: DC
  Filled 2023-12-27: qty 30, 30d supply, fill #0

## 2023-12-31 ENCOUNTER — Other Ambulatory Visit (HOSPITAL_COMMUNITY): Payer: Self-pay

## 2024-01-08 DIAGNOSIS — Z1212 Encounter for screening for malignant neoplasm of rectum: Secondary | ICD-10-CM | POA: Diagnosis not present

## 2024-01-08 DIAGNOSIS — E291 Testicular hypofunction: Secondary | ICD-10-CM | POA: Diagnosis not present

## 2024-01-10 ENCOUNTER — Other Ambulatory Visit (HOSPITAL_COMMUNITY): Payer: Self-pay

## 2024-01-10 MED ORDER — DOXEPIN HCL 10 MG/ML PO CONC
3.0000 mg | Freq: Every evening | ORAL | 3 refills | Status: AC | PRN
  Filled 2024-01-10: qty 50, 166d supply, fill #0

## 2024-01-14 DIAGNOSIS — R82998 Other abnormal findings in urine: Secondary | ICD-10-CM | POA: Diagnosis not present

## 2024-01-14 DIAGNOSIS — I10 Essential (primary) hypertension: Secondary | ICD-10-CM | POA: Diagnosis not present

## 2024-02-08 ENCOUNTER — Other Ambulatory Visit (HOSPITAL_BASED_OUTPATIENT_CLINIC_OR_DEPARTMENT_OTHER): Payer: Self-pay

## 2024-02-08 ENCOUNTER — Other Ambulatory Visit (HOSPITAL_COMMUNITY): Payer: Self-pay

## 2024-02-08 MED ORDER — METHYLPHENIDATE HCL ER 20 MG PO TBCR
20.0000 mg | EXTENDED_RELEASE_TABLET | Freq: Every day | ORAL | 0 refills | Status: DC
  Filled 2024-02-08: qty 30, 30d supply, fill #0

## 2024-02-10 ENCOUNTER — Other Ambulatory Visit (HOSPITAL_COMMUNITY): Payer: Self-pay

## 2024-02-11 ENCOUNTER — Other Ambulatory Visit: Payer: Self-pay

## 2024-02-11 ENCOUNTER — Other Ambulatory Visit (HOSPITAL_BASED_OUTPATIENT_CLINIC_OR_DEPARTMENT_OTHER): Payer: Self-pay

## 2024-02-11 MED ORDER — METOPROLOL SUCCINATE ER 25 MG PO TB24
25.0000 mg | ORAL_TABLET | Freq: Every day | ORAL | 3 refills | Status: AC
  Filled 2024-02-11: qty 90, 90d supply, fill #0

## 2024-02-12 ENCOUNTER — Other Ambulatory Visit (HOSPITAL_BASED_OUTPATIENT_CLINIC_OR_DEPARTMENT_OTHER): Payer: Self-pay

## 2024-02-23 ENCOUNTER — Other Ambulatory Visit (HOSPITAL_COMMUNITY): Payer: Self-pay

## 2024-02-25 ENCOUNTER — Other Ambulatory Visit: Payer: Self-pay

## 2024-02-25 ENCOUNTER — Other Ambulatory Visit (HOSPITAL_COMMUNITY): Payer: Self-pay

## 2024-02-25 MED ORDER — SIMVASTATIN 40 MG PO TABS
40.0000 mg | ORAL_TABLET | Freq: Every evening | ORAL | 0 refills | Status: AC
  Filled 2024-02-25: qty 90, 90d supply, fill #0

## 2024-02-25 MED ORDER — EZETIMIBE 10 MG PO TABS
10.0000 mg | ORAL_TABLET | Freq: Every day | ORAL | 3 refills | Status: AC
  Filled 2024-02-25: qty 90, 90d supply, fill #0

## 2024-02-25 MED ORDER — AMMONIUM LACTATE 12 % EX LOTN: 1.0000 | TOPICAL_LOTION | Freq: Two times a day (BID) | CUTANEOUS | 3 refills | Status: AC

## 2024-02-25 MED ORDER — AMLODIPINE BESYLATE 5 MG PO TABS
5.0000 mg | ORAL_TABLET | Freq: Every day | ORAL | 3 refills | Status: AC
  Filled 2024-02-25: qty 90, 90d supply, fill #0

## 2024-02-26 ENCOUNTER — Other Ambulatory Visit (HOSPITAL_COMMUNITY): Payer: Self-pay

## 2024-02-26 MED ORDER — TESTOSTERONE CYPIONATE 200 MG/ML IM SOLN: INTRAMUSCULAR | 3 refills | Status: AC

## 2024-02-27 ENCOUNTER — Other Ambulatory Visit (HOSPITAL_COMMUNITY): Payer: Self-pay

## 2024-02-29 ENCOUNTER — Other Ambulatory Visit (HOSPITAL_BASED_OUTPATIENT_CLINIC_OR_DEPARTMENT_OTHER): Payer: Self-pay

## 2024-02-29 ENCOUNTER — Other Ambulatory Visit (HOSPITAL_COMMUNITY): Payer: Self-pay

## 2024-03-07 ENCOUNTER — Other Ambulatory Visit (HOSPITAL_BASED_OUTPATIENT_CLINIC_OR_DEPARTMENT_OTHER): Payer: Self-pay

## 2024-03-07 MED ORDER — METHYLPHENIDATE HCL ER 20 MG PO TBCR
20.0000 mg | EXTENDED_RELEASE_TABLET | Freq: Every day | ORAL | 0 refills | Status: AC
  Filled 2024-03-11: qty 30, 30d supply, fill #0

## 2024-03-07 MED ORDER — TRETINOIN 0.05 % EX GEL
CUTANEOUS | 1 refills | Status: DC
  Filled 2024-03-07 – 2024-03-11 (×2): qty 45, 30d supply, fill #0

## 2024-03-10 ENCOUNTER — Other Ambulatory Visit (HOSPITAL_BASED_OUTPATIENT_CLINIC_OR_DEPARTMENT_OTHER): Payer: Self-pay

## 2024-03-11 ENCOUNTER — Other Ambulatory Visit (HOSPITAL_BASED_OUTPATIENT_CLINIC_OR_DEPARTMENT_OTHER): Payer: Self-pay

## 2024-03-11 ENCOUNTER — Other Ambulatory Visit: Payer: Self-pay

## 2024-03-11 ENCOUNTER — Other Ambulatory Visit (HOSPITAL_COMMUNITY): Payer: Self-pay

## 2024-03-11 MED ORDER — TRETINOIN 0.05 % EX CREA
TOPICAL_CREAM | CUTANEOUS | 1 refills | Status: AC
  Filled 2024-03-11 (×4): qty 20, 30d supply, fill #0

## 2024-03-11 MED ORDER — TRETINOIN 0.05 % EX CREA: TOPICAL_CREAM | CUTANEOUS | 1 refills | Status: AC

## 2024-03-12 ENCOUNTER — Other Ambulatory Visit (HOSPITAL_BASED_OUTPATIENT_CLINIC_OR_DEPARTMENT_OTHER): Payer: Self-pay

## 2024-03-13 ENCOUNTER — Other Ambulatory Visit (HOSPITAL_BASED_OUTPATIENT_CLINIC_OR_DEPARTMENT_OTHER): Payer: Self-pay
